# Patient Record
Sex: Female | Born: 1955 | Race: Black or African American | Hispanic: No | Marital: Single
Health system: Southern US, Community
[De-identification: ages and names within clinical notes are randomized; demographics above are authoritative.]

## PROBLEM LIST (undated history)

## (undated) DIAGNOSIS — I639 Cerebral infarction, unspecified: Secondary | ICD-10-CM

## (undated) DIAGNOSIS — I10 Essential (primary) hypertension: Secondary | ICD-10-CM

---

## 2020-08-13 ENCOUNTER — Observation Stay (HOSPITAL_COMMUNITY): Payer: Medicaid Other

## 2020-08-13 ENCOUNTER — Other Ambulatory Visit: Payer: Self-pay

## 2020-08-13 ENCOUNTER — Emergency Department (HOSPITAL_COMMUNITY): Payer: Medicaid Other

## 2020-08-13 ENCOUNTER — Encounter (HOSPITAL_COMMUNITY): Payer: Self-pay

## 2020-08-13 ENCOUNTER — Inpatient Hospital Stay (HOSPITAL_COMMUNITY)
Admission: EM | Admit: 2020-08-13 | Discharge: 2020-08-19 | DRG: 304 | Disposition: A | Discharge status: Skilled Nursing Facility | Payer: Medicaid Other | Attending: Internal Medicine | Admitting: Internal Medicine

## 2020-08-13 DIAGNOSIS — I340 Nonrheumatic mitral (valve) insufficiency: Secondary | ICD-10-CM | POA: Diagnosis present

## 2020-08-13 DIAGNOSIS — N189 Chronic kidney disease, unspecified: Secondary | ICD-10-CM

## 2020-08-13 DIAGNOSIS — G8929 Other chronic pain: Secondary | ICD-10-CM | POA: Diagnosis present

## 2020-08-13 DIAGNOSIS — I2510 Atherosclerotic heart disease of native coronary artery without angina pectoris: Secondary | ICD-10-CM | POA: Diagnosis present

## 2020-08-13 DIAGNOSIS — Z7982 Long term (current) use of aspirin: Secondary | ICD-10-CM

## 2020-08-13 DIAGNOSIS — K219 Gastro-esophageal reflux disease without esophagitis: Secondary | ICD-10-CM | POA: Diagnosis present

## 2020-08-13 DIAGNOSIS — R262 Difficulty in walking, not elsewhere classified: Secondary | ICD-10-CM | POA: Diagnosis present

## 2020-08-13 DIAGNOSIS — J9691 Respiratory failure, unspecified with hypoxia: Secondary | ICD-10-CM

## 2020-08-13 DIAGNOSIS — N19 Unspecified kidney failure: Secondary | ICD-10-CM

## 2020-08-13 DIAGNOSIS — E785 Hyperlipidemia, unspecified: Secondary | ICD-10-CM | POA: Diagnosis present

## 2020-08-13 DIAGNOSIS — R531 Weakness: Secondary | ICD-10-CM | POA: Diagnosis present

## 2020-08-13 DIAGNOSIS — G4700 Insomnia, unspecified: Secondary | ICD-10-CM | POA: Diagnosis present

## 2020-08-13 DIAGNOSIS — I252 Old myocardial infarction: Secondary | ICD-10-CM

## 2020-08-13 DIAGNOSIS — I248 Other forms of acute ischemic heart disease: Secondary | ICD-10-CM | POA: Diagnosis present

## 2020-08-13 DIAGNOSIS — I6381 Other cerebral infarction due to occlusion or stenosis of small artery: Secondary | ICD-10-CM | POA: Diagnosis present

## 2020-08-13 DIAGNOSIS — F419 Anxiety disorder, unspecified: Secondary | ICD-10-CM | POA: Diagnosis present

## 2020-08-13 DIAGNOSIS — F0390 Unspecified dementia without behavioral disturbance: Secondary | ICD-10-CM | POA: Diagnosis present

## 2020-08-13 DIAGNOSIS — Z6831 Body mass index (BMI) 31.0-31.9, adult: Secondary | ICD-10-CM

## 2020-08-13 DIAGNOSIS — Z8679 Personal history of other diseases of the circulatory system: Secondary | ICD-10-CM

## 2020-08-13 DIAGNOSIS — G9341 Metabolic encephalopathy: Secondary | ICD-10-CM | POA: Diagnosis present

## 2020-08-13 DIAGNOSIS — E669 Obesity, unspecified: Secondary | ICD-10-CM | POA: Diagnosis present

## 2020-08-13 DIAGNOSIS — I639 Cerebral infarction, unspecified: Secondary | ICD-10-CM | POA: Diagnosis present

## 2020-08-13 DIAGNOSIS — R29701 NIHSS score 1: Secondary | ICD-10-CM | POA: Diagnosis present

## 2020-08-13 DIAGNOSIS — Z7902 Long term (current) use of antithrombotics/antiplatelets: Secondary | ICD-10-CM

## 2020-08-13 DIAGNOSIS — I6529 Occlusion and stenosis of unspecified carotid artery: Secondary | ICD-10-CM | POA: Diagnosis present

## 2020-08-13 DIAGNOSIS — Z9114 Patient's other noncompliance with medication regimen: Secondary | ICD-10-CM

## 2020-08-13 DIAGNOSIS — I5033 Acute on chronic diastolic (congestive) heart failure: Secondary | ICD-10-CM | POA: Diagnosis present

## 2020-08-13 DIAGNOSIS — R630 Anorexia: Secondary | ICD-10-CM | POA: Diagnosis present

## 2020-08-13 DIAGNOSIS — I10 Essential (primary) hypertension: Secondary | ICD-10-CM | POA: Diagnosis present

## 2020-08-13 DIAGNOSIS — Z23 Encounter for immunization: Secondary | ICD-10-CM

## 2020-08-13 DIAGNOSIS — F32A Depression, unspecified: Secondary | ICD-10-CM

## 2020-08-13 DIAGNOSIS — R5381 Other malaise: Secondary | ICD-10-CM | POA: Diagnosis present

## 2020-08-13 DIAGNOSIS — R109 Unspecified abdominal pain: Secondary | ICD-10-CM | POA: Diagnosis present

## 2020-08-13 DIAGNOSIS — R0600 Dyspnea, unspecified: Secondary | ICD-10-CM

## 2020-08-13 DIAGNOSIS — Z20822 Contact with and (suspected) exposure to covid-19: Secondary | ICD-10-CM | POA: Diagnosis present

## 2020-08-13 DIAGNOSIS — D696 Thrombocytopenia, unspecified: Secondary | ICD-10-CM | POA: Diagnosis present

## 2020-08-13 DIAGNOSIS — F209 Schizophrenia, unspecified: Secondary | ICD-10-CM | POA: Diagnosis present

## 2020-08-13 DIAGNOSIS — I130 Hypertensive heart and chronic kidney disease with heart failure and stage 1 through stage 4 chronic kidney disease, or unspecified chronic kidney disease: Secondary | ICD-10-CM | POA: Diagnosis present

## 2020-08-13 DIAGNOSIS — Z79899 Other long term (current) drug therapy: Secondary | ICD-10-CM

## 2020-08-13 DIAGNOSIS — I160 Hypertensive urgency: Secondary | ICD-10-CM | POA: Diagnosis present

## 2020-08-13 DIAGNOSIS — N184 Chronic kidney disease, stage 4 (severe): Secondary | ICD-10-CM | POA: Diagnosis present

## 2020-08-13 DIAGNOSIS — Z951 Presence of aortocoronary bypass graft: Secondary | ICD-10-CM

## 2020-08-13 DIAGNOSIS — N179 Acute kidney failure, unspecified: Secondary | ICD-10-CM | POA: Diagnosis present

## 2020-08-13 DIAGNOSIS — R413 Other amnesia: Secondary | ICD-10-CM

## 2020-08-13 DIAGNOSIS — K5900 Constipation, unspecified: Secondary | ICD-10-CM | POA: Diagnosis not present

## 2020-08-13 DIAGNOSIS — I161 Hypertensive emergency: Secondary | ICD-10-CM | POA: Diagnosis present

## 2020-08-13 DIAGNOSIS — D631 Anemia in chronic kidney disease: Secondary | ICD-10-CM | POA: Diagnosis present

## 2020-08-13 LAB — CBC WITH DIFFERENTIAL/PLATELET
Abs Immature Granulocytes: 0.03 10*3/uL (ref 0.00–0.07)
Basophils Absolute: 0 10*3/uL (ref 0.0–0.1)
Basophils Relative: 0 %
Eosinophils Absolute: 0.3 10*3/uL (ref 0.0–0.5)
Eosinophils Relative: 4 %
HCT: 32.8 % — ABNORMAL LOW (ref 36.0–46.0)
Hemoglobin: 10.5 g/dL — ABNORMAL LOW (ref 12.0–15.0)
Immature Granulocytes: 0 %
Lymphocytes Relative: 35 %
Lymphs Abs: 2.8 10*3/uL (ref 0.7–4.0)
MCH: 28.7 pg (ref 26.0–34.0)
MCHC: 32 g/dL (ref 30.0–36.0)
MCV: 89.6 fL (ref 80.0–100.0)
Monocytes Absolute: 0.5 10*3/uL (ref 0.1–1.0)
Monocytes Relative: 7 %
Neutro Abs: 4.3 10*3/uL (ref 1.7–7.7)
Neutrophils Relative %: 54 %
Platelets: 167 10*3/uL (ref 150–400)
RBC: 3.66 MIL/uL — ABNORMAL LOW (ref 3.87–5.11)
RDW: 14.8 % (ref 11.5–15.5)
WBC: 8 10*3/uL (ref 4.0–10.5)
nRBC: 0 % (ref 0.0–0.2)

## 2020-08-13 LAB — COMPREHENSIVE METABOLIC PANEL
ALT: 15 U/L (ref 0–44)
AST: 14 U/L — ABNORMAL LOW (ref 15–41)
Albumin: 3.8 g/dL (ref 3.5–5.0)
Alkaline Phosphatase: 68 U/L (ref 38–126)
Anion gap: 8 (ref 5–15)
BUN: 65 mg/dL — ABNORMAL HIGH (ref 8–23)
CO2: 22 mmol/L (ref 22–32)
Calcium: 9.2 mg/dL (ref 8.9–10.3)
Chloride: 109 mmol/L (ref 98–111)
Creatinine, Ser: 3.72 mg/dL — ABNORMAL HIGH (ref 0.44–1.00)
GFR, Estimated: 13 mL/min — ABNORMAL LOW (ref >60)
Glucose, Bld: 118 mg/dL — ABNORMAL HIGH (ref 70–99)
Potassium: 4.4 mmol/L (ref 3.5–5.1)
Sodium: 139 mmol/L (ref 135–145)
Total Bilirubin: 0.4 mg/dL (ref 0.3–1.2)
Total Protein: 7.4 g/dL (ref 6.5–8.1)

## 2020-08-13 LAB — URINALYSIS, ROUTINE W REFLEX MICROSCOPIC
Bilirubin Urine: NEGATIVE
Glucose, UA: NEGATIVE mg/dL
Hgb urine dipstick: NEGATIVE
Ketones, ur: NEGATIVE mg/dL
Leukocytes,Ua: NEGATIVE
Nitrite: NEGATIVE
Protein, ur: >=300 mg/dL — AB
Specific Gravity, Urine: 1.013 (ref 1.005–1.030)
pH: 5 (ref 5.0–8.0)

## 2020-08-13 LAB — TROPONIN I (HIGH SENSITIVITY): Troponin I (High Sensitivity): 23 ng/L — ABNORMAL HIGH (ref <18)

## 2020-08-13 LAB — HEMOGLOBIN A1C
Hgb A1c MFr Bld: 6.3 % — ABNORMAL HIGH (ref 4.8–5.6)
Mean Plasma Glucose: 134.11 mg/dL

## 2020-08-13 LAB — BRAIN NATRIURETIC PEPTIDE: B Natriuretic Peptide: 973.1 pg/mL — ABNORMAL HIGH (ref 0.0–100.0)

## 2020-08-13 IMAGING — CT CT RENAL STONE PROTOCOL
2 of 4 series · 16 of 46 positions shown, 18 images · non-contrast
Comparison: None.

CLINICAL DATA: Flank pain, urinary retention

EXAM:
CT ABDOMEN AND PELVIS WITHOUT CONTRAST
TECHNIQUE: Multidetector CT imaging of the abdomen and pelvis was performed
following the standard protocol without IV contrast.

[Series 2: axial st · axial · 0.87mm/px · z∈[-554,-164]mm · 13 of 88 slices shown, 15 images]
[im 5/88  soft-tissue]
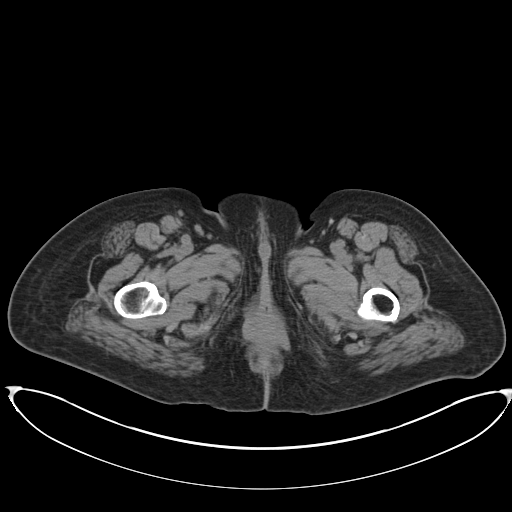
[im 5/88  bone]
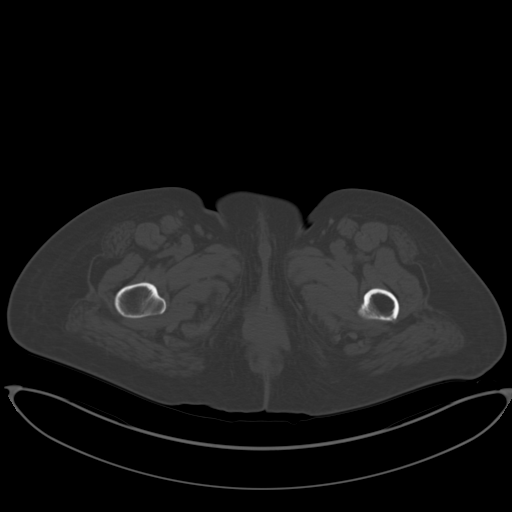
[im 10/88  soft-tissue]
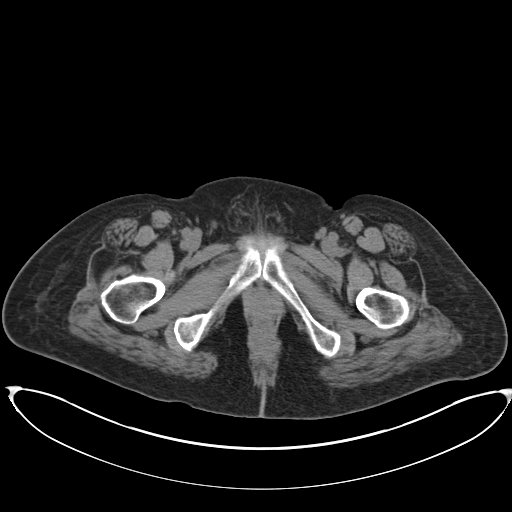
[im 20/88  soft-tissue]
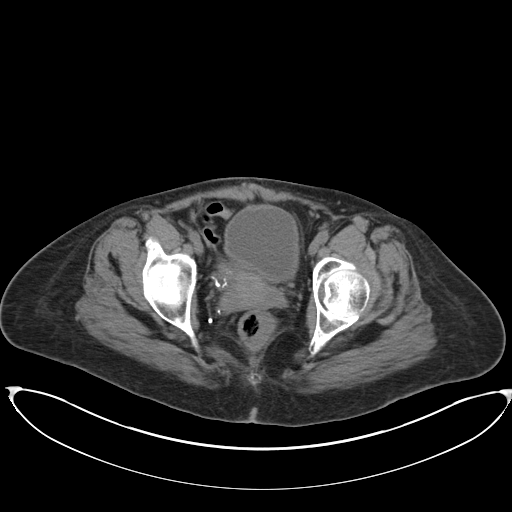
[im 25/88  soft-tissue]
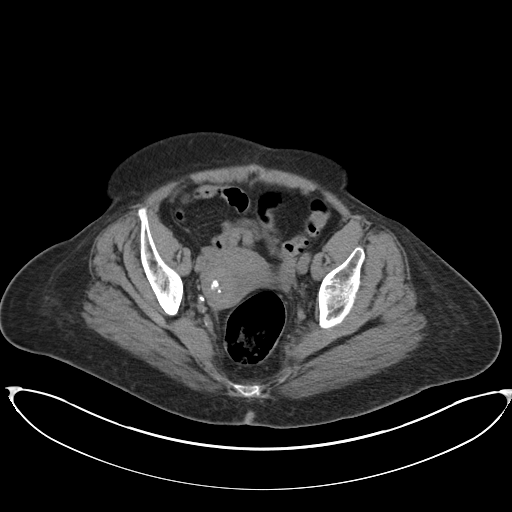
[im 30/88  soft-tissue]
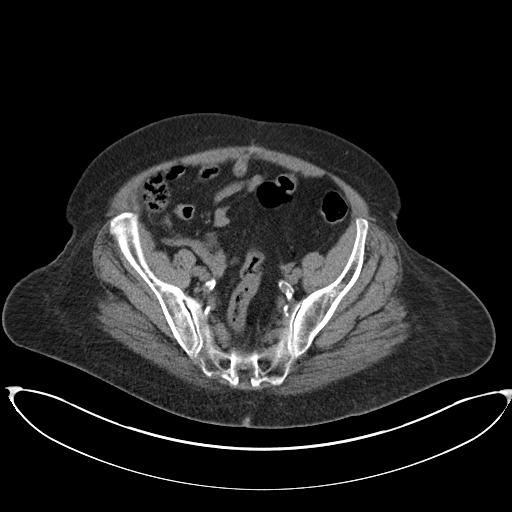
[im 39/88  soft-tissue]
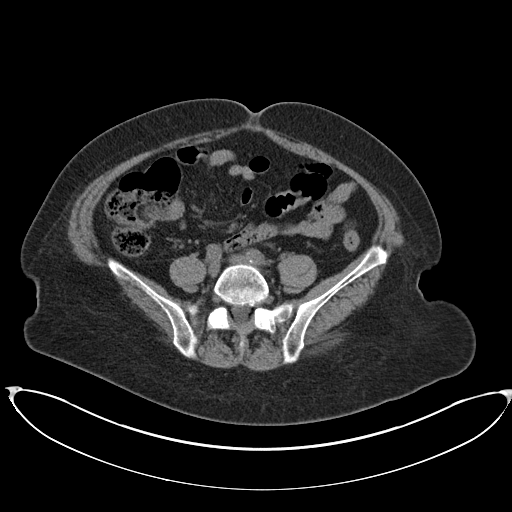
[im 44/88  soft-tissue]
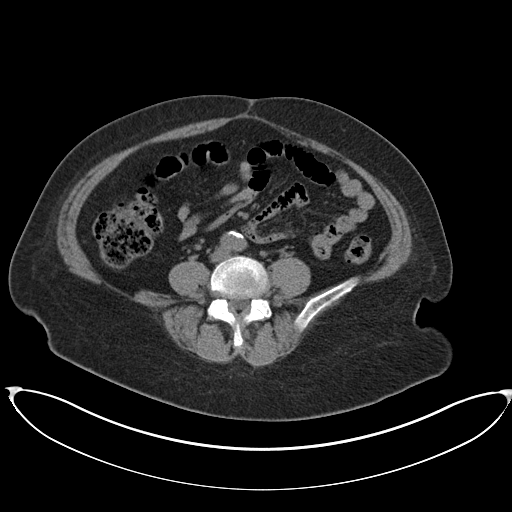
[im 49/88  soft-tissue]
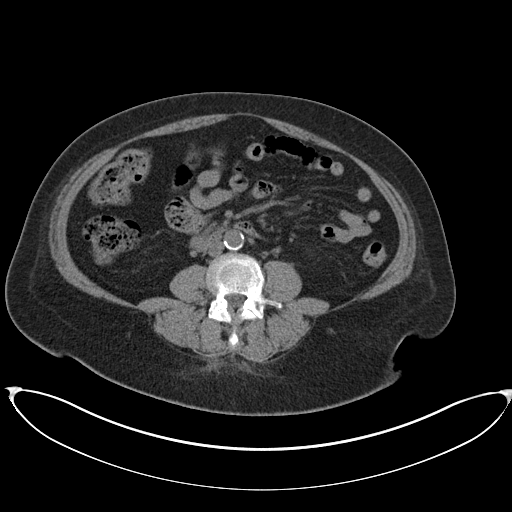
[im 59/88  soft-tissue]
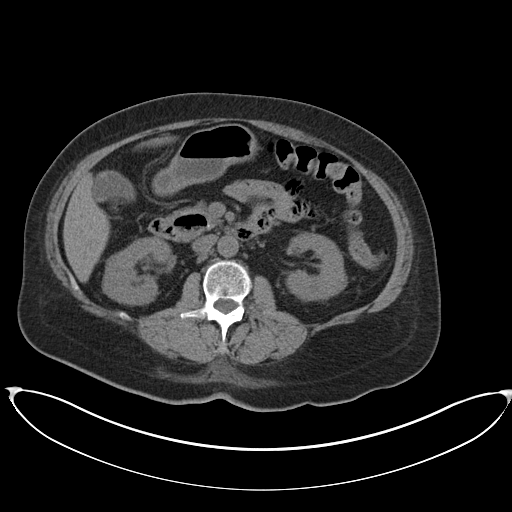
[im 59/88  bone]
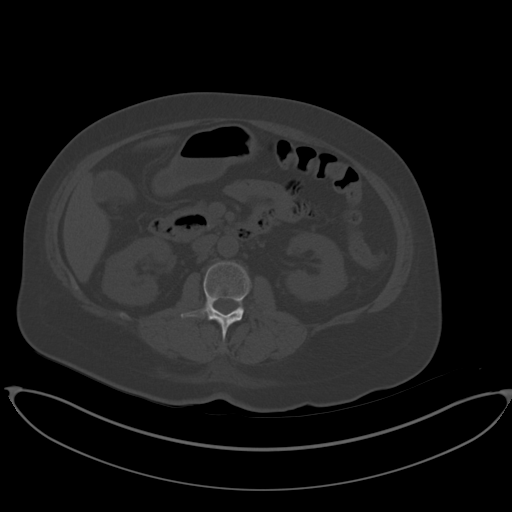
[im 63/88  soft-tissue]
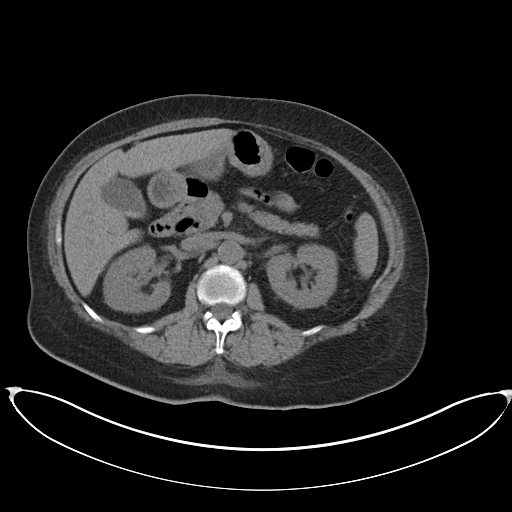
[im 68/88  soft-tissue]
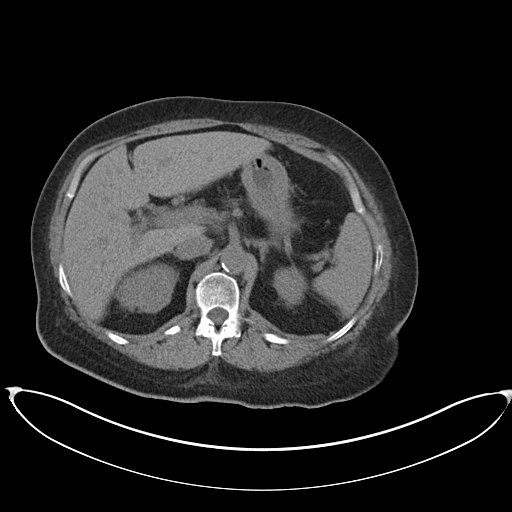
[im 78/88  soft-tissue]
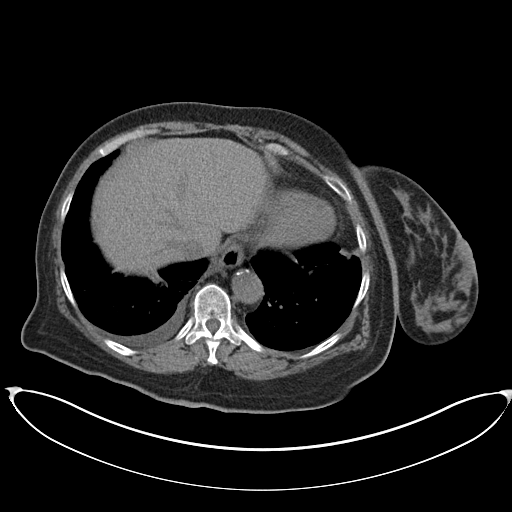
[im 83/88  soft-tissue]
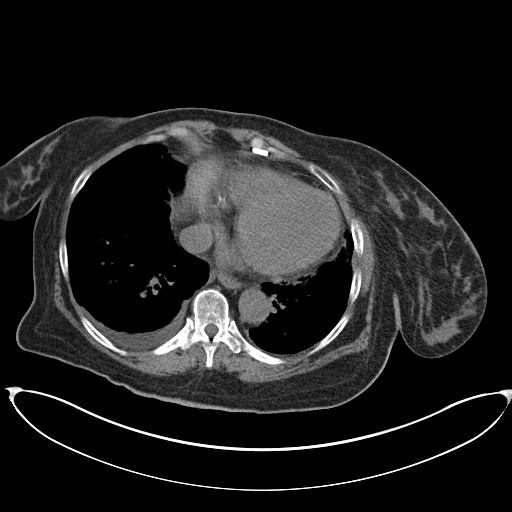

[Series 5: coronal · coronal · 0.75mm/px · 3 of 147 slices shown]
[im 49/147  soft-tissue]
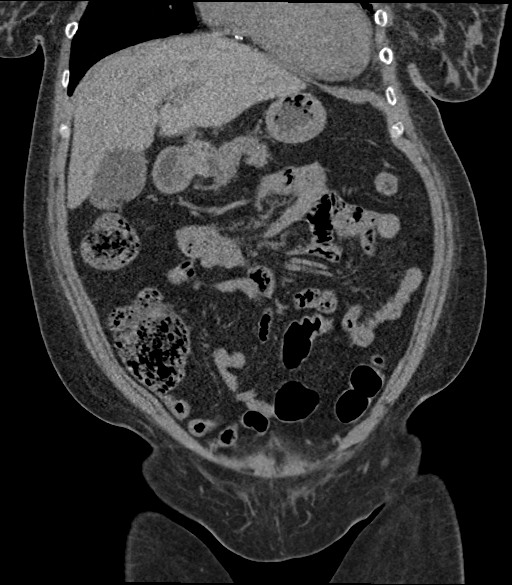
[im 65/147  soft-tissue]
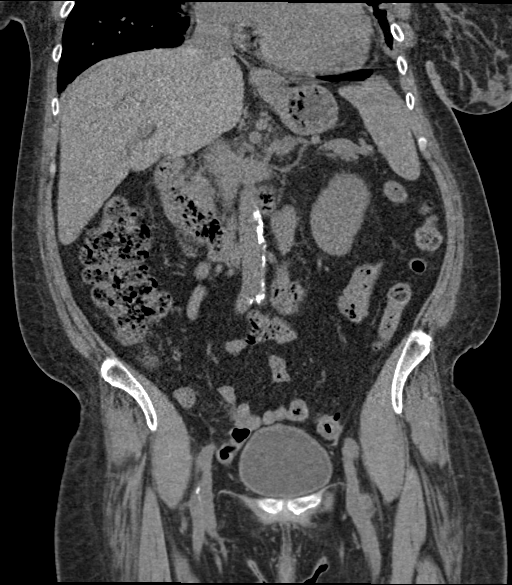
[im 82/147  soft-tissue]
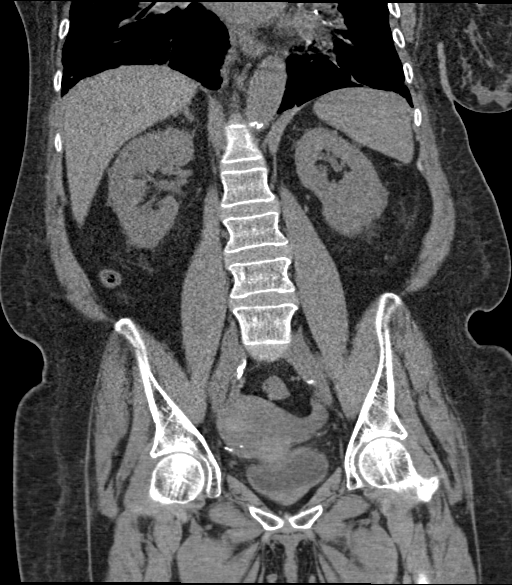

[16 of 46 positions shown; findings below may reference images not displayed]

FINDINGS: Lower chest: There is a trace right pleural effusion. Patchy areas
of consolidation at the lung bases likely reflect atelectasis. No
pericardial effusion.

Hepatobiliary: Small gallstones are seen layering dependently in the
gallbladder. No evidence of acute cholecystitis. Unenhanced imaging
of the liver is unremarkable. No biliary dilation.

Pancreas: Unremarkable. No pancreatic ductal dilatation or
surrounding inflammatory changes.

Spleen: Normal in size without focal abnormality.

Adrenals/Urinary Tract: No urinary tract calculi or obstructive
uropathy. Bladder is unremarkable. The adrenals are normal.

Stomach/Bowel: No bowel obstruction or ileus. Normal appendix right
lower quadrant. No bowel wall thickening or inflammatory change.

Vascular/Lymphatic: Aortic atherosclerosis. No enlarged abdominal or
pelvic lymph nodes.

Reproductive: Degenerating uterine fibroid right fundal aspect
measuring up to 3.6 cm. Otherwise the uterus and adnexal structures
are unremarkable.

Other: No free fluid or free gas. No abdominal wall hernia.

Musculoskeletal: No acute or destructive bony lesions. Reconstructed
images demonstrate no additional findings.
IMPRESSION: 1. No urinary tract calculi or obstructive uropathy.
2. Trace right pleural effusion.
3. Cholelithiasis without evidence of acute cholecystitis.
4. Degenerating uterine fibroid.
5. Aortic Atherosclerosis ([FW]-[FW]).

## 2020-08-13 IMAGING — DX DG CHEST 1V PORT
1 series · 1 of 1 positions shown · non-contrast
Comparison: None.

CLINICAL DATA: Abdomen pain hypertension

EXAM:
PORTABLE CHEST 1 VIEW

[chest ap]
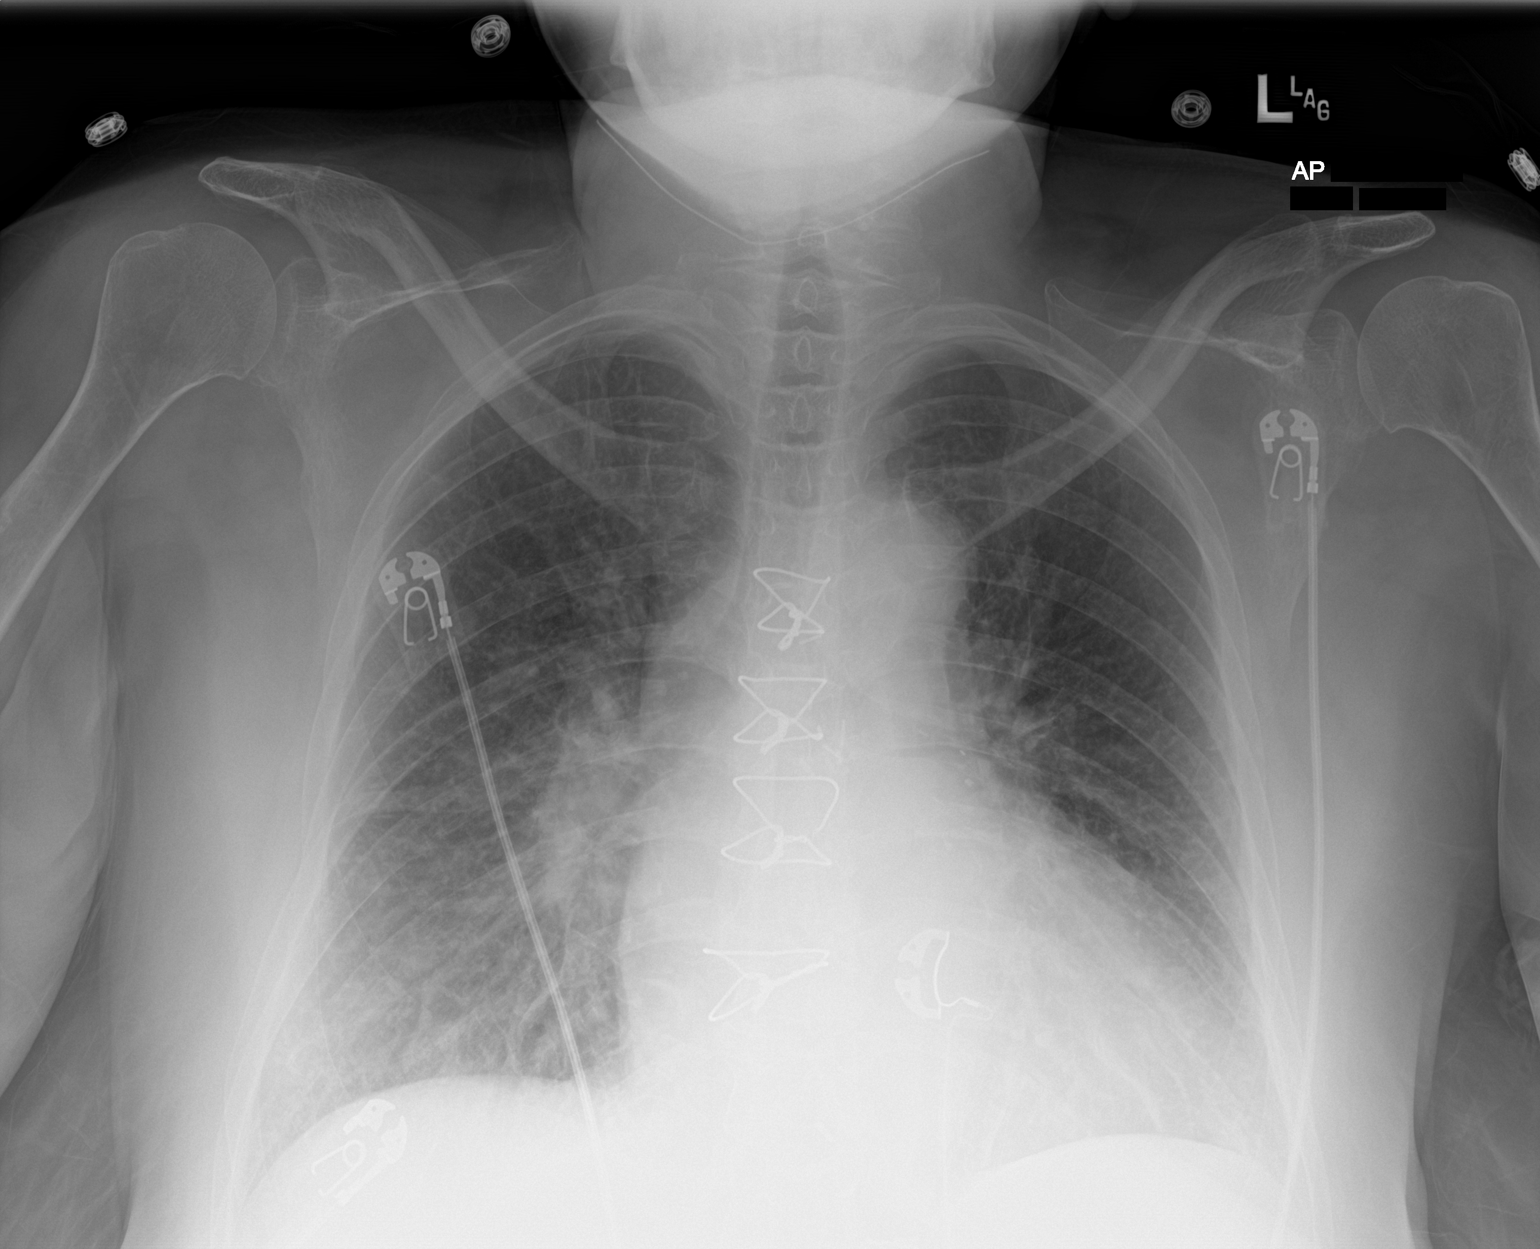

[1 of 1 positions shown; findings below may reference images not displayed]

FINDINGS: Post sternotomy changes. Cardiomegaly with vascular congestion and
mild diffuse interstitial opacity, likely edema. No pneumothorax.
IMPRESSION: Cardiomegaly with vascular congestion and probable mild interstitial
edema.

## 2020-08-13 IMAGING — CT CT HEAD W/O CM
3 series · 16 of 47 positions shown, 19 images · non-contrast
Comparison: None.

CLINICAL DATA: Encephalopathy

EXAM:
CT HEAD WITHOUT CONTRAST
TECHNIQUE: Contiguous axial images were obtained from the base of the skull
through the vertex without intravenous contrast.

[Series 2: head wo · axial · 0.42mm/px · z∈[-155,-30]mm · 10 of 30 slices shown, 13 images]
[im 3/30  brain]
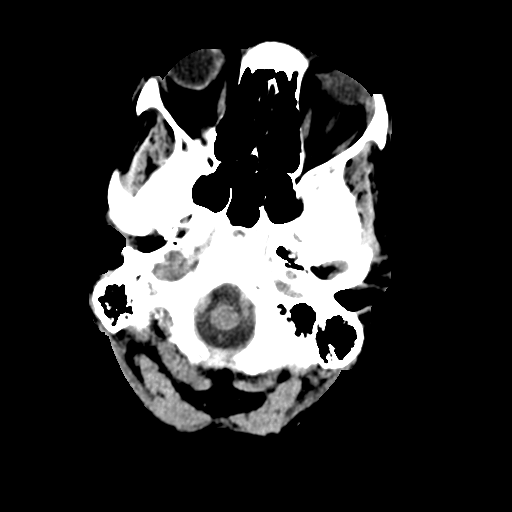
[im 3/30  bone]
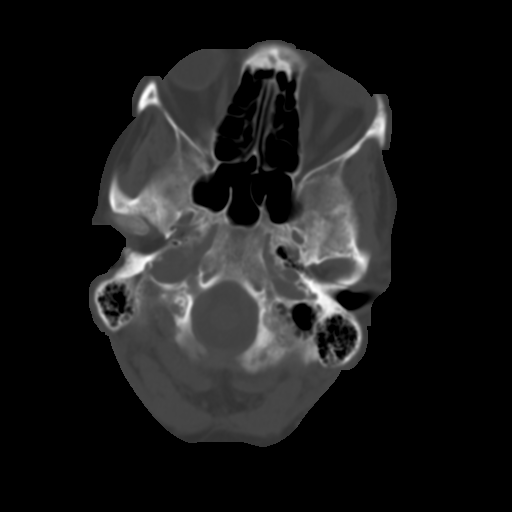
[im 6/30  brain]
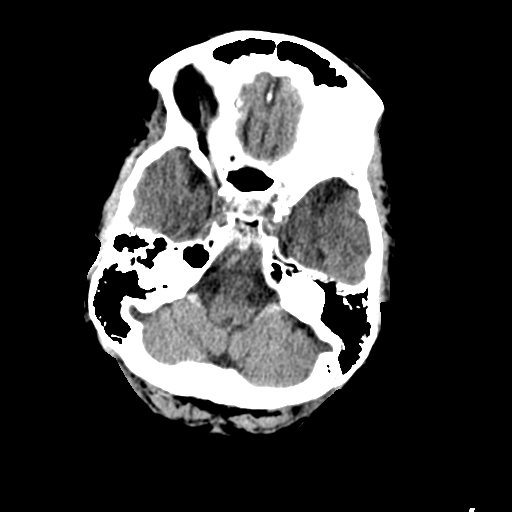
[im 9/30  brain]
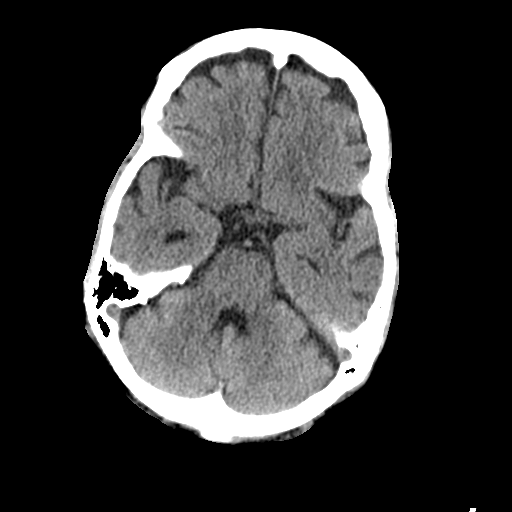
[im 11/30  brain]
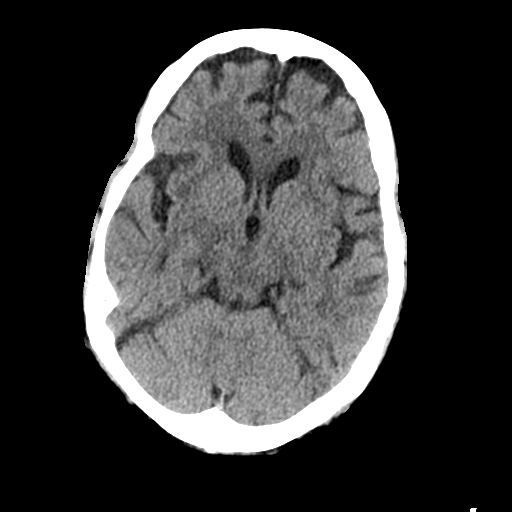
[im 14/30  brain]
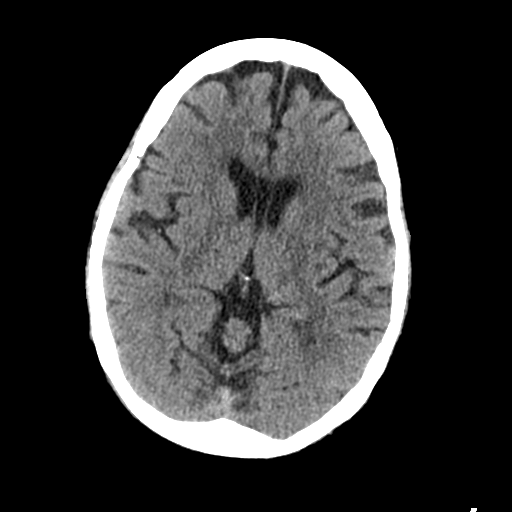
[im 14/30  bone]
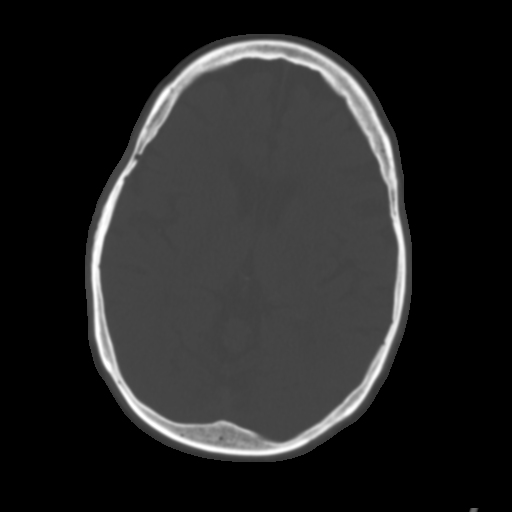
[im 17/30  brain]
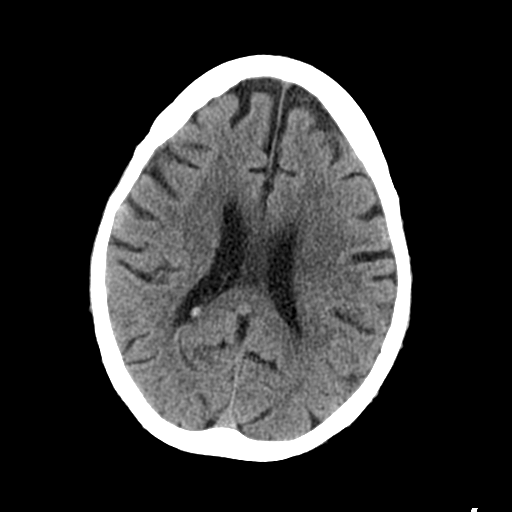
[im 20/30  brain]
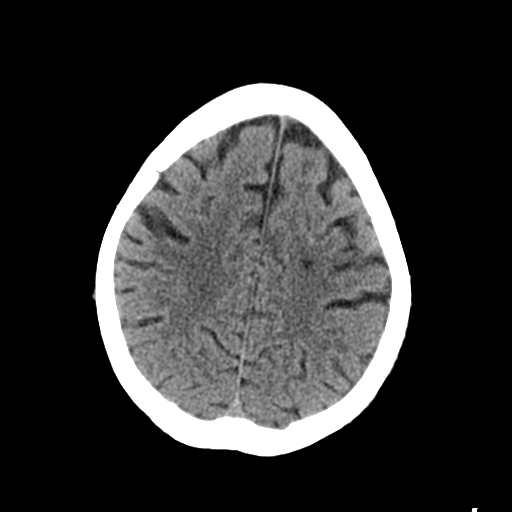
[im 23/30  brain]
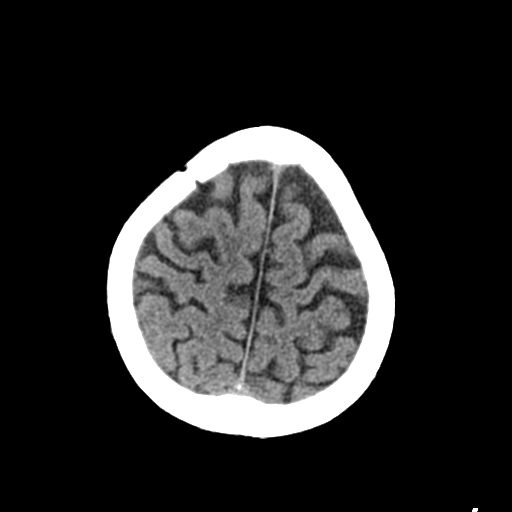
[im 25/30  brain]
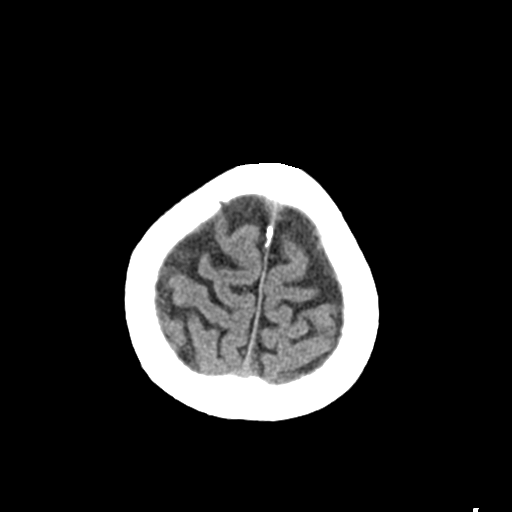
[im 25/30  bone]
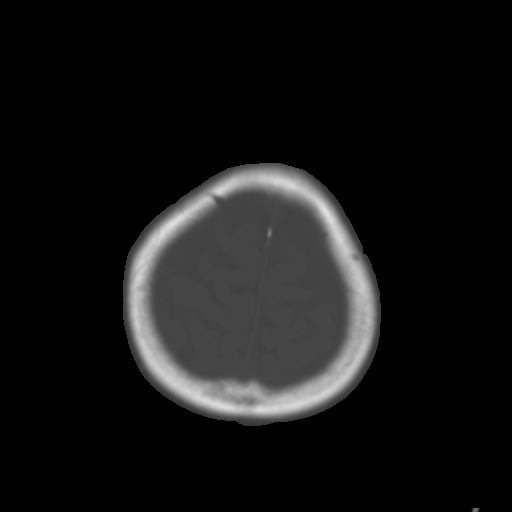
[im 28/30  brain]
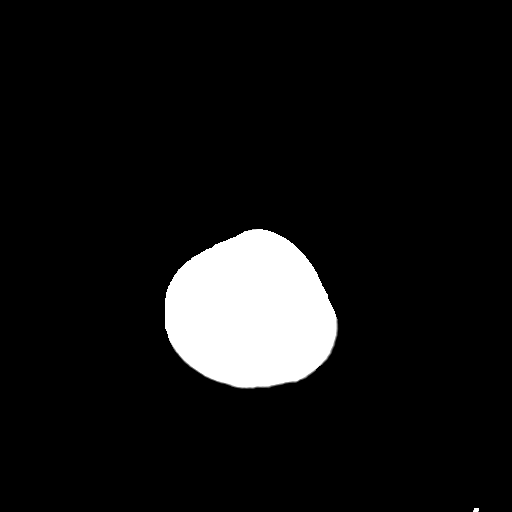

[Series 5: coronal soft tissue · coronal · 0.29mm/px · 3 of 63 slices shown]
[im 21/63  brain]
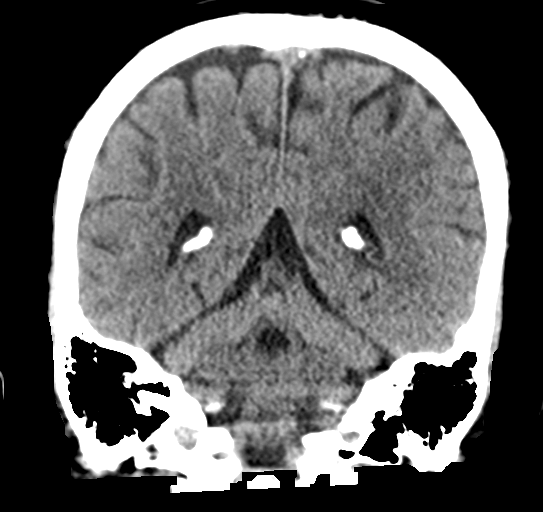
[im 28/63  brain]
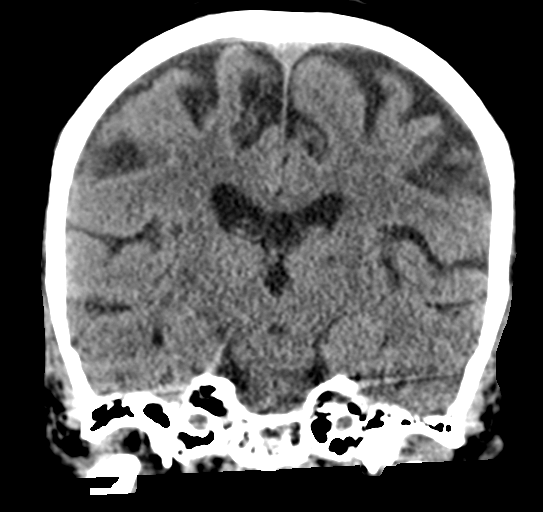
[im 35/63  brain]
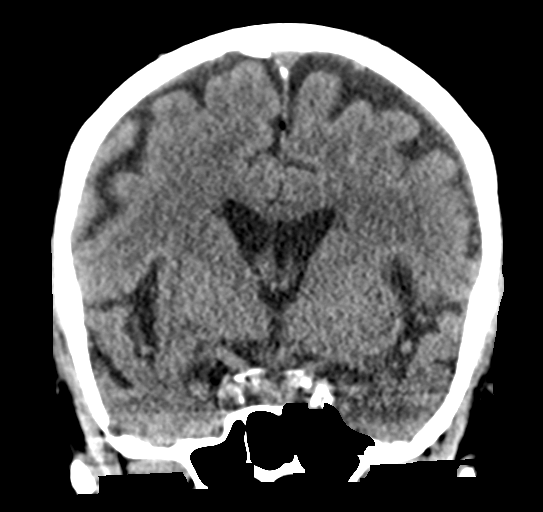

[Series 6: sagittal soft tissue · sagittal · 0.29mm/px · 3 of 49 slices shown]
[im 17/49  brain]
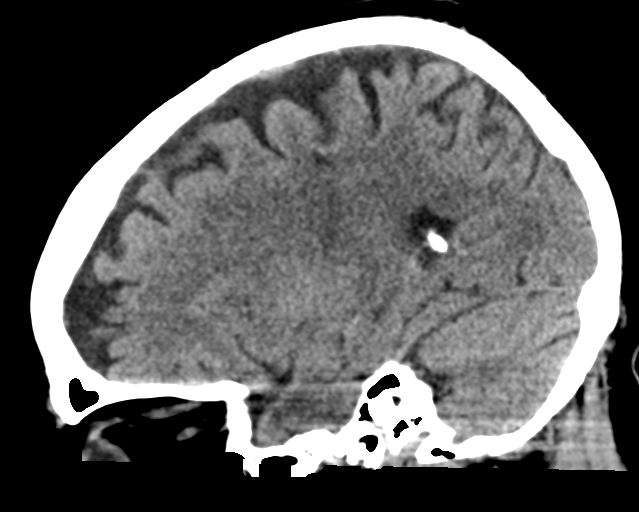
[im 25/49  brain]
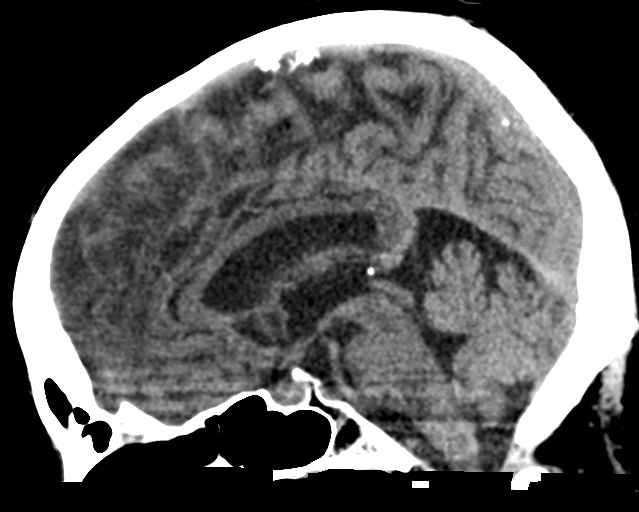
[im 33/49  brain]
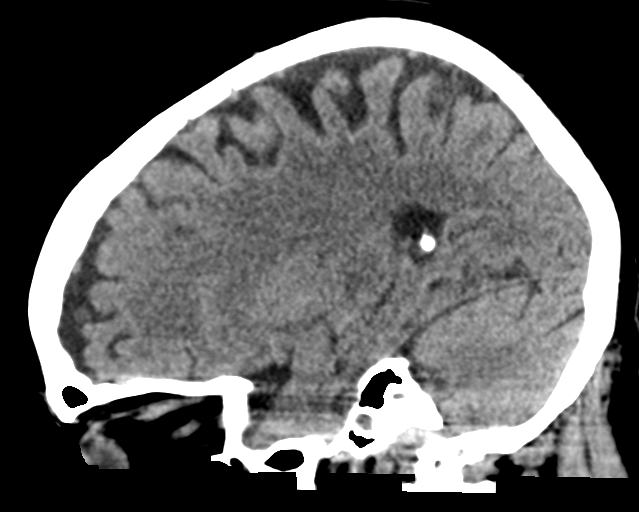

[16 of 47 positions shown; findings below may reference images not displayed]

FINDINGS: Brain: There is no mass, hemorrhage or extra-axial collection. The
size and configuration of the ventricles and extra-axial CSF spaces
are normal. There is hypoattenuation of the white matter, most
commonly indicating chronic small vessel disease.

Vascular: No abnormal hyperdensity of the major intracranial
arteries or dural venous sinuses. No intracranial atherosclerosis.

Skull: The visualized skull base, calvarium and extracranial soft
tissues are normal.

Sinuses/Orbits: No fluid levels or advanced mucosal thickening of
the visualized paranasal sinuses. No mastoid or middle ear effusion.
The orbits are normal.
IMPRESSION: Chronic small vessel disease without acute intracranial abnormality.

## 2020-08-13 MED ORDER — ACETAMINOPHEN 325 MG PO TABS
325.0000 mg | ORAL_TABLET | Freq: Four times a day (QID) | ORAL | Status: DC | PRN
  Administered 2020-08-14 – 2020-08-26 (×7): 325 mg via ORAL
  Filled 2020-08-13 (×9): qty 1

## 2020-08-13 MED ORDER — PANTOPRAZOLE SODIUM 40 MG PO TBEC
40.0000 mg | DELAYED_RELEASE_TABLET | Freq: Every day | ORAL | Status: DC
  Administered 2020-08-13 – 2020-08-24 (×12): 40 mg via ORAL
  Filled 2020-08-13 (×15): qty 1

## 2020-08-13 MED ORDER — MORPHINE SULFATE (PF) 4 MG/ML IV SOLN
4.0000 mg | Freq: Once | INTRAVENOUS | Status: AC
  Administered 2020-08-13: 16:00:00 4 mg via INTRAVENOUS
  Filled 2020-08-13: qty 1

## 2020-08-13 MED ORDER — ATORVASTATIN CALCIUM 40 MG PO TABS
40.0000 mg | ORAL_TABLET | Freq: Every day | ORAL | Status: DC
  Administered 2020-08-13 – 2020-08-26 (×14): 40 mg via ORAL
  Filled 2020-08-13 (×14): qty 1

## 2020-08-13 MED ORDER — CARVEDILOL 12.5 MG PO TABS
12.5000 mg | ORAL_TABLET | Freq: Two times a day (BID) | ORAL | Status: DC
  Administered 2020-08-13 – 2020-08-15 (×4): 12.5 mg via ORAL
  Filled 2020-08-13 (×4): qty 1

## 2020-08-13 MED ORDER — FLUOXETINE HCL 20 MG PO CAPS
80.0000 mg | ORAL_CAPSULE | Freq: Every day | ORAL | Status: DC
  Administered 2020-08-13 – 2020-08-26 (×14): 80 mg via ORAL
  Filled 2020-08-13 (×14): qty 4

## 2020-08-13 MED ORDER — ONDANSETRON HCL 4 MG/2ML IJ SOLN: 4.0000 mg | Freq: Four times a day (QID) | INTRAMUSCULAR | Status: DC | PRN

## 2020-08-13 MED ORDER — MELATONIN 3 MG PO TABS
3.0000 mg | ORAL_TABLET | Freq: Every day | ORAL | Status: DC
  Administered 2020-08-13 – 2020-08-25 (×13): 3 mg via ORAL
  Filled 2020-08-13 (×13): qty 1

## 2020-08-13 MED ORDER — ACETAMINOPHEN 650 MG RE SUPP: 325.0000 mg | Freq: Four times a day (QID) | RECTAL | Status: DC | PRN

## 2020-08-13 MED ORDER — AMLODIPINE BESYLATE 10 MG PO TABS
10.0000 mg | ORAL_TABLET | Freq: Every day | ORAL | Status: DC
  Administered 2020-08-13 – 2020-08-15 (×3): 10 mg via ORAL
  Filled 2020-08-13: qty 2
  Filled 2020-08-13 (×2): qty 1

## 2020-08-13 MED ORDER — INFLUENZA VAC SPLIT QUAD 0.5 ML IM SUSY
0.5000 mL | PREFILLED_SYRINGE | INTRAMUSCULAR | Status: AC
  Administered 2020-08-15: 10:00:00 0.5 mL via INTRAMUSCULAR
  Filled 2020-08-13: qty 0.5

## 2020-08-13 MED ORDER — LABETALOL HCL 5 MG/ML IV SOLN
10.0000 mg | Freq: Once | INTRAVENOUS | Status: AC
  Administered 2020-08-13: 20:00:00 10 mg via INTRAVENOUS
  Filled 2020-08-13: qty 4

## 2020-08-13 MED ORDER — ONDANSETRON HCL 4 MG PO TABS: 4.0000 mg | ORAL_TABLET | Freq: Four times a day (QID) | ORAL | Status: DC | PRN

## 2020-08-13 MED ORDER — ONDANSETRON HCL 4 MG/2ML IJ SOLN
4.0000 mg | Freq: Once | INTRAMUSCULAR | Status: AC
  Administered 2020-08-13: 16:00:00 4 mg via INTRAVENOUS
  Filled 2020-08-13: qty 2

## 2020-08-13 NOTE — ED Provider Notes (Signed)
[facility] DEPT Provider Note   CSN: [id number] Arrival date & time: 08/13/20  1453     History Chief Complaint  Patient presents with  . Urinary Retention  . Flank Pain    Christina Nunez is a 64 y.o. female.  Christina Nunez is a 64 y.o. female with a history of high blood pressure and chronic kidney disease, other medical history is unclear, who presents to the ED complaining of flank and right-sided abdominal pain with urinary retention.  Patient is accompanied by her sister who is now her caretaker.  Previously patient was living with her son and received all of her medical care in [place].  The patient sister reports she needs to get "a full medical evaluation and a psychiatric evaluation" they are trying to get her a primary care doctor after recently getting her Medicare.  She has been living with his sister since October, sister brought in medications but is not sure if these are the only medication she supposed to be taking, and states she has been running out of them.  Patient states that she is unsure the last time she was able to urinate, sister thinks it has been 6-8 hours at least but is also unsure.  Patient denies burning or pain with urination or blood in her urine.  Reports she has been having flank and back pain and right-sided abdominal pain intermittently since yesterday.  Reports she is nauseated but has not had any vomiting.  No fevers or chills.  Denies chest pain, shortness of breath or cough.  Reports normal bowel movements, nonbloody.  Patient is unable to provide much additional history, 2 other question she answers that she does not know or is not sure.  Patient's sister who is taken over her care is not sure of all her prior medical history, and states that is why she needs a full medical evaluation and think she needs a psychiatric evaluation as well, they have been trying to get her into [facility] but she has not yet been seen.  Patient denies  any HI or SI.        History reviewed. No pertinent past medical history.  Patient Active Problem List   Diagnosis Date Noted  . Essential hypertension 08/13/2020  . Hypertensive emergency 08/13/2020    History reviewed. No pertinent surgical history.   OB History   No obstetric history on file.     History reviewed. No pertinent family history.     Home Medications Prior to Admission medications   Medication Sig Start Date End Date Taking? Authorizing Provider  amLODipine (NORVASC) 10 MG tablet Take 10 mg by mouth daily.   Yes [provider]  aspirin EC 81 MG tablet Take 81 mg by mouth daily. Swallow whole.   Yes [provider]  clopidogrel (PLAVIX) 75 MG tablet Take 75 mg by mouth daily.   Yes [provider]  FLUoxetine (PROZAC) 40 MG capsule Take 80 mg by mouth daily.   Yes [provider]  furosemide (LASIX) 80 MG tablet Take 80 mg by mouth 2 (two) times daily.   Yes [provider]  Vitamin D, Ergocalciferol, (DRISDOL) 1.25 MG (50000 UNIT) CAPS capsule Take 50,000 Units by mouth every 7 (seven) days.   Yes [provider]    Allergies    Patient has no known allergies.  Review of Systems   Review of Systems  Constitutional: Negative for chills and fever.  HENT: Negative.   Respiratory: Negative  for cough and shortness of breath.   Cardiovascular: Negative for chest pain and leg swelling.  Gastrointestinal: Positive for abdominal pain and nausea. Negative for blood in stool, constipation, diarrhea and vomiting.  Genitourinary: Positive for difficulty urinating and flank pain. Negative for dysuria, frequency, hematuria, vaginal bleeding and vaginal discharge.  Musculoskeletal: Positive for back pain. Negative for arthralgias and myalgias.  Skin: Negative for color change and rash.  Neurological: Negative for dizziness, syncope and light-headedness.    Physical Exam Updated Vital Signs BP (!) 218/109 (BP  Location: Right Arm)   Pulse 82   Temp 98.1 F (36.7 C) (Oral)   Resp 16   Ht 5\' 3"  (1.6 m)   Wt 81.6 kg   SpO2 93%   BMI 31.89 kg/m   Physical Exam Vitals and nursing note reviewed.  Constitutional:      General: She is not in acute distress.    Appearance: She is well-developed and well-nourished. She is not diaphoretic.     Comments: Patient is alert, able to answer most questions and follow commands, chronically ill-appearing but in no acute distress  HENT:     Head: Normocephalic and atraumatic.     Mouth/Throat:     Mouth: Oropharynx is clear and moist.  Eyes:     General:        Right eye: No discharge.        Left eye: No discharge.     Extraocular Movements: EOM normal.     Pupils: Pupils are equal, round, and reactive to light.  Cardiovascular:     Rate and Rhythm: Normal rate and regular rhythm.     Pulses: Intact distal pulses.     Heart sounds: Normal heart sounds. No murmur heard. No friction rub. No gallop.   Pulmonary:     Effort: Pulmonary effort is normal. No respiratory distress.     Breath sounds: Normal breath sounds. No wheezing or rales.     Comments: Respirations equal and unlabored, patient able to speak in full sentences, lungs clear to auscultation bilaterally Abdominal:     General: Bowel sounds are normal. There is no distension.     Palpations: Abdomen is soft. There is no mass.     Tenderness: There is abdominal tenderness. There is no guarding.     Comments: Abdomen is soft, nondistended, prior surgical scars noted, bowel sounds noted throughout, there is some mild pain over the right side of the abdomen noted without guarding, patient also reports some CVA tenderness bilaterally.  No peritoneal signs.  Musculoskeletal:        General: No deformity or edema.     Cervical back: Neck supple.  Skin:    General: Skin is warm and dry.     Capillary Refill: Capillary refill takes less than 2 seconds.  Neurological:     Mental Status: She is  alert.     Coordination: Coordination normal.     Comments: Speech is clear, able to follow commands CN III-XII intact Normal strength in upper and lower extremities bilaterally including dorsiflexion and plantar flexion, strong and equal grip strength Sensation normal to light and sharp touch Moves extremities without ataxia, coordination intact  Psychiatric:        Mood and Affect: Mood normal.        Behavior: Behavior normal.     ED Results / Procedures / Treatments   Labs (all labs ordered are listed, but only abnormal results are displayed) Labs Reviewed  URINALYSIS, ROUTINE  W REFLEX MICROSCOPIC - Abnormal; Notable for the following components:      Result Value   Color, Urine STRAW (*)    Protein, ur >=300 (*)    Bacteria, UA RARE (*)    All other components within normal limits  COMPREHENSIVE METABOLIC PANEL - Abnormal; Notable for the following components:   Glucose, Bld 118 (*)    BUN 65 (*)    Creatinine, Ser 3.72 (*)    AST 14 (*)    GFR, Estimated 13 (*)    All other components within normal limits  CBC WITH DIFFERENTIAL/PLATELET - Abnormal; Notable for the following components:   RBC 3.66 (*)    Hemoglobin 10.5 (*)    HCT 32.8 (*)    All other components within normal limits  HEMOGLOBIN A1C - Abnormal; Notable for the following components:   Hgb A1c MFr Bld 6.3 (*)    All other components within normal limits  BRAIN NATRIURETIC PEPTIDE - Abnormal; Notable for the following components:   B Natriuretic Peptide 973.1 (*)    All other components within normal limits  TROPONIN I (HIGH SENSITIVITY) - Abnormal; Notable for the following components:   Troponin I (High Sensitivity) 23 (*)    All other components within normal limits  SARS CORONAVIRUS 2 (TAT 6-24 HRS)  HIV ANTIBODY (ROUTINE TESTING W REFLEX)  TSH  CBC  BASIC METABOLIC PANEL  VITAMIN D 25 HYDROXY (VIT D DEFICIENCY, FRACTURES)  VITAMIN B12  TROPONIN I (HIGH SENSITIVITY)     EKG None  Radiology CT Head Wo Contrast  Result Date: 08/13/2020 CLINICAL DATA:  Encephalopathy EXAM: CT HEAD WITHOUT CONTRAST TECHNIQUE: Contiguous axial images were obtained from the base of the skull through the vertex without intravenous contrast. COMPARISON:  None. FINDINGS: Brain: There is no mass, hemorrhage or extra-axial collection. The size and configuration of the ventricles and extra-axial CSF spaces are normal. There is hypoattenuation of the white matter, most commonly indicating chronic small vessel disease. Vascular: No abnormal hyperdensity of the major intracranial arteries or dural venous sinuses. No intracranial atherosclerosis. Skull: The visualized skull base, calvarium and extracranial soft tissues are normal. Sinuses/Orbits: No fluid levels or advanced mucosal thickening of the visualized paranasal sinuses. No mastoid or middle ear effusion. The orbits are normal. IMPRESSION: Chronic small vessel disease without acute intracranial abnormality. Electronically Signed   By: Ulyses Jarred M.D.   On: 08/13/2020 23:24   DG Chest Portable 1 View  Result Date: 08/13/2020 CLINICAL DATA:  Abdomen pain hypertension EXAM: PORTABLE CHEST 1 VIEW COMPARISON:  None. FINDINGS: Post sternotomy changes. Cardiomegaly with vascular congestion and mild diffuse interstitial opacity, likely edema. No pneumothorax. IMPRESSION: Cardiomegaly with vascular congestion and probable mild interstitial edema. Electronically Signed   By: Donavan Foil M.D.   On: 08/13/2020 21:32   CT Renal Stone Study  Result Date: 08/13/2020 CLINICAL DATA:  Flank pain, urinary retention EXAM: CT ABDOMEN AND PELVIS WITHOUT CONTRAST TECHNIQUE: Multidetector CT imaging of the abdomen and pelvis was performed following the standard protocol without IV contrast. COMPARISON:  None. FINDINGS: Lower chest: There is a trace right pleural effusion. Patchy areas of consolidation at the lung bases likely reflect atelectasis. No  pericardial effusion. Hepatobiliary: Small gallstones are seen layering dependently in the gallbladder. No evidence of acute cholecystitis. Unenhanced imaging of the liver is unremarkable. No biliary dilation. Pancreas: Unremarkable. No pancreatic ductal dilatation or surrounding inflammatory changes. Spleen: Normal in size without focal abnormality. Adrenals/Urinary Tract: No urinary tract calculi or obstructive  uropathy. Bladder is unremarkable. The adrenals are normal. Stomach/Bowel: No bowel obstruction or ileus. Normal appendix right lower quadrant. No bowel wall thickening or inflammatory change. Vascular/Lymphatic: Aortic atherosclerosis. No enlarged abdominal or pelvic lymph nodes. Reproductive: Degenerating uterine fibroid right fundal aspect measuring up to 3.6 cm. Otherwise the uterus and adnexal structures are unremarkable. Other: No free fluid or free gas. No abdominal wall hernia. Musculoskeletal: No acute or destructive bony lesions. Reconstructed images demonstrate no additional findings. IMPRESSION: 1. No urinary tract calculi or obstructive uropathy. 2. Trace right pleural effusion. 3. Cholelithiasis without evidence of acute cholecystitis. 4. Degenerating uterine fibroid. 5. Aortic Atherosclerosis (ICD10-I70.0). Electronically Signed   By: Randa Ngo M.D.   On: 08/13/2020 17:47    Procedures Procedures (including critical care time)  Medications Ordered in ED Medications  influenza vac split quadrivalent PF (FLUARIX) injection 0.5 mL (has no administration in time range)  atorvastatin (LIPITOR) tablet 40 mg (40 mg Oral Given 08/13/20 2312)  carvedilol (COREG) tablet 12.5 mg (12.5 mg Oral Given 08/13/20 2312)  amLODipine (NORVASC) tablet 10 mg (10 mg Oral Given 08/13/20 2312)  FLUoxetine (PROZAC) capsule 80 mg (80 mg Oral Given 08/13/20 2311)  acetaminophen (TYLENOL) tablet 325 mg (has no administration in time range)    Or  acetaminophen (TYLENOL) suppository 325 mg (has no  administration in time range)  ondansetron (ZOFRAN) tablet 4 mg (has no administration in time range)    Or  ondansetron (ZOFRAN) injection 4 mg (has no administration in time range)  pantoprazole (PROTONIX) EC tablet 40 mg (40 mg Oral Given 08/13/20 2311)  melatonin tablet 3 mg (3 mg Oral Given 08/13/20 2311)  morphine 4 MG/ML injection 4 mg (4 mg Intravenous Given 08/13/20 1625)  ondansetron (ZOFRAN) injection 4 mg (4 mg Intravenous Given 08/13/20 1625)  labetalol (NORMODYNE) injection 10 mg (10 mg Intravenous Given 08/13/20 2019)    ED Course  I have reviewed the triage vital signs and the nursing notes.  Pertinent labs & imaging results that were available during my care of the patient were reviewed by me and considered in my medical decision making (see chart for details).    MDM Rules/Calculators/A&P                         64 year old female initially presents complaining of flank pain and urinary retention although is unsure the last time she was able to use the bathroom.  Denies associated dysuria or hematuria, no prior history.  Family also reports history of chronic kidney disease.  On arrival patient is chronically ill-appearing but in no acute distress.  She is very hypertensive with initial blood pressure of 218/109.  Family is not exactly sure what blood pressure medication she is on but states she has been running out of most of her medications and is not sure if she has been taking them regularly.  She recently began living with her sister in October after moving from [place] where she was living with her son.  She has some right-sided abdominal pain without peritoneal signs and reports flank tenderness bilaterally.  On bladder scan patient only has 237 mL in bladder, so we will hold off and placing Foley especially without clear history of retention.  Will check basic lab work and urinalysis.  Will get CT renal stone study as well.  I have independently ordered, reviewed and  interpreted all labs and imaging: CBC: No leukocytosis, hemoglobin of 10.5 CMP: Glucose of 118 but no  other significant electrolyte derangements, creatinine of 3.72 with BUN of 65, LFTs unremarkable. No prior lab work available in system for comparison  Notified by nursing staff that patient got up and was able to go to the bathroom but did not tell anyone and no sample was collected.  Spoke with family regarding this, stressed the importance of obtaining a urine sample.  Reassured the patient does not appear to truly be having urinary retention.  CT renal study with no urinary tract calculi or obstructive uropathy, trace right pleural effusion and cholelithiasis without cholecystitis but no other acute abnormalities noted.  Degenerating uterine fibroid noted.  I was able to speak more with patient's sister and she was able to access patient's MyChart account from [place] where she had labs done in September 2021 with creatinine of 2.5 at this time.  I am concerned that patient has acute AKI on top of her CKD, no more recent labs to compare to.  Patient with very poorly controlled hypertension with blood pressures up to the 220s and based on reports from patient and sister, poor medication compliance.  Based on medications at bedside she is only on amlodipine for blood pressure management and 80 mg of furosemide twice daily which would likely worsen her kidney function.  Concerned with blood pressures and worsening kidney function and no outpatient follow-up currently she is at high risk for worsening kidney function.  Will give labetalol for hypertension and feel patient will likely need admission for hypertensive emergency with AKI.  Case discussed with Dr. Tobie Poet with [facility] hospitalist who will see and admit the patient with concern for hypertensive emergency.  After discussing case we will add CT head, troponins, EKG and chest x-ray to patient's evaluation.  Given it is unclear what patient's baseline  mental status is, and this could be related to hypertensive emergency as well.  Patient denies chest pain but given her undifferentiated abdominal pain EKG and troponins could be helpful as well.  Final Clinical Impression(s) / ED Diagnoses Final diagnoses:  Hypertensive emergency  Acute kidney injury superimposed on chronic kidney disease [facility])    Rx / DC Orders ED Discharge Orders    None       Jacqlyn Larsen, PA-C 08/14/20 0000    Deno Etienne, DO 08/14/20 1603
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
## 2020-08-13 NOTE — ED Triage Notes (Signed)
Pt presents with c/o flank pain and urinary retention. Pt reports that she has been unable to urinate since last night. Pt's family also reports that they are trying to get her evaluated by [facility] as well. Pt says that she has a hx of kidney stones but she does not believe this is a kidney stone.

## 2020-08-13 NOTE — ED Notes (Signed)
Took pt bp x2 informed RN

## 2020-08-14 ENCOUNTER — Observation Stay (HOSPITAL_BASED_OUTPATIENT_CLINIC_OR_DEPARTMENT_OTHER): Payer: Medicaid Other

## 2020-08-14 ENCOUNTER — Observation Stay (HOSPITAL_COMMUNITY): Payer: Medicaid Other

## 2020-08-14 DIAGNOSIS — I639 Cerebral infarction, unspecified: Secondary | ICD-10-CM | POA: Diagnosis not present

## 2020-08-14 DIAGNOSIS — E785 Hyperlipidemia, unspecified: Secondary | ICD-10-CM | POA: Diagnosis present

## 2020-08-14 DIAGNOSIS — F209 Schizophrenia, unspecified: Secondary | ICD-10-CM | POA: Diagnosis present

## 2020-08-14 DIAGNOSIS — D696 Thrombocytopenia, unspecified: Secondary | ICD-10-CM | POA: Diagnosis present

## 2020-08-14 DIAGNOSIS — Z23 Encounter for immunization: Secondary | ICD-10-CM | POA: Diagnosis not present

## 2020-08-14 DIAGNOSIS — Z6831 Body mass index (BMI) 31.0-31.9, adult: Secondary | ICD-10-CM | POA: Diagnosis not present

## 2020-08-14 DIAGNOSIS — G9341 Metabolic encephalopathy: Secondary | ICD-10-CM | POA: Diagnosis present

## 2020-08-14 DIAGNOSIS — N184 Chronic kidney disease, stage 4 (severe): Secondary | ICD-10-CM | POA: Diagnosis present

## 2020-08-14 DIAGNOSIS — I6381 Other cerebral infarction due to occlusion or stenosis of small artery: Secondary | ICD-10-CM | POA: Diagnosis present

## 2020-08-14 DIAGNOSIS — I160 Hypertensive urgency: Secondary | ICD-10-CM | POA: Diagnosis present

## 2020-08-14 DIAGNOSIS — I248 Other forms of acute ischemic heart disease: Secondary | ICD-10-CM | POA: Diagnosis present

## 2020-08-14 DIAGNOSIS — I130 Hypertensive heart and chronic kidney disease with heart failure and stage 1 through stage 4 chronic kidney disease, or unspecified chronic kidney disease: Secondary | ICD-10-CM | POA: Diagnosis present

## 2020-08-14 DIAGNOSIS — F32A Depression, unspecified: Secondary | ICD-10-CM

## 2020-08-14 DIAGNOSIS — Z8679 Personal history of other diseases of the circulatory system: Secondary | ICD-10-CM | POA: Diagnosis not present

## 2020-08-14 DIAGNOSIS — G8929 Other chronic pain: Secondary | ICD-10-CM | POA: Diagnosis present

## 2020-08-14 DIAGNOSIS — G4700 Insomnia, unspecified: Secondary | ICD-10-CM | POA: Diagnosis present

## 2020-08-14 DIAGNOSIS — R609 Edema, unspecified: Secondary | ICD-10-CM | POA: Diagnosis not present

## 2020-08-14 DIAGNOSIS — R109 Unspecified abdominal pain: Secondary | ICD-10-CM | POA: Diagnosis present

## 2020-08-14 DIAGNOSIS — I252 Old myocardial infarction: Secondary | ICD-10-CM | POA: Diagnosis not present

## 2020-08-14 DIAGNOSIS — I2510 Atherosclerotic heart disease of native coronary artery without angina pectoris: Secondary | ICD-10-CM | POA: Diagnosis present

## 2020-08-14 DIAGNOSIS — I5033 Acute on chronic diastolic (congestive) heart failure: Secondary | ICD-10-CM | POA: Diagnosis present

## 2020-08-14 DIAGNOSIS — I5030 Unspecified diastolic (congestive) heart failure: Secondary | ICD-10-CM | POA: Diagnosis not present

## 2020-08-14 DIAGNOSIS — N179 Acute kidney failure, unspecified: Secondary | ICD-10-CM | POA: Diagnosis present

## 2020-08-14 DIAGNOSIS — I10 Essential (primary) hypertension: Secondary | ICD-10-CM | POA: Diagnosis not present

## 2020-08-14 DIAGNOSIS — I161 Hypertensive emergency: Secondary | ICD-10-CM | POA: Diagnosis present

## 2020-08-14 DIAGNOSIS — Z20822 Contact with and (suspected) exposure to covid-19: Secondary | ICD-10-CM | POA: Diagnosis present

## 2020-08-14 DIAGNOSIS — E669 Obesity, unspecified: Secondary | ICD-10-CM | POA: Diagnosis present

## 2020-08-14 DIAGNOSIS — I340 Nonrheumatic mitral (valve) insufficiency: Secondary | ICD-10-CM | POA: Diagnosis present

## 2020-08-14 DIAGNOSIS — F419 Anxiety disorder, unspecified: Secondary | ICD-10-CM | POA: Diagnosis present

## 2020-08-14 DIAGNOSIS — D631 Anemia in chronic kidney disease: Secondary | ICD-10-CM | POA: Diagnosis present

## 2020-08-14 DIAGNOSIS — R413 Other amnesia: Secondary | ICD-10-CM | POA: Diagnosis present

## 2020-08-14 DIAGNOSIS — F0390 Unspecified dementia without behavioral disturbance: Secondary | ICD-10-CM | POA: Diagnosis present

## 2020-08-14 LAB — SARS CORONAVIRUS 2 (TAT 6-24 HRS): SARS Coronavirus 2: NEGATIVE

## 2020-08-14 LAB — TSH: TSH: 2.066 u[IU]/mL (ref 0.350–4.500)

## 2020-08-14 LAB — GLUCOSE, CAPILLARY
Glucose-Capillary: 112 mg/dL — ABNORMAL HIGH (ref 70–99)
Glucose-Capillary: 108 mg/dL — ABNORMAL HIGH (ref 70–99)
Glucose-Capillary: 127 mg/dL — ABNORMAL HIGH (ref 70–99)
Glucose-Capillary: 118 mg/dL — ABNORMAL HIGH (ref 70–99)
Glucose-Capillary: 176 mg/dL — ABNORMAL HIGH (ref 70–99)

## 2020-08-14 LAB — BASIC METABOLIC PANEL
Anion gap: 10 (ref 5–15)
BUN: 62 mg/dL — ABNORMAL HIGH (ref 8–23)
CO2: 20 mmol/L — ABNORMAL LOW (ref 22–32)
Calcium: 9.2 mg/dL (ref 8.9–10.3)
Chloride: 110 mmol/L (ref 98–111)
Creatinine, Ser: 3.85 mg/dL — ABNORMAL HIGH (ref 0.44–1.00)
GFR, Estimated: 13 mL/min — ABNORMAL LOW (ref >60)
Glucose, Bld: 119 mg/dL — ABNORMAL HIGH (ref 70–99)
Potassium: 4.7 mmol/L (ref 3.5–5.1)
Sodium: 140 mmol/L (ref 135–145)

## 2020-08-14 LAB — CBC
HCT: 29.3 % — ABNORMAL LOW (ref 36.0–46.0)
Hemoglobin: 9.4 g/dL — ABNORMAL LOW (ref 12.0–15.0)
MCH: 29.3 pg (ref 26.0–34.0)
MCHC: 32.1 g/dL (ref 30.0–36.0)
MCV: 91.3 fL (ref 80.0–100.0)
Platelets: 141 10*3/uL — ABNORMAL LOW (ref 150–400)
RBC: 3.21 MIL/uL — ABNORMAL LOW (ref 3.87–5.11)
RDW: 15 % (ref 11.5–15.5)
WBC: 7 10*3/uL (ref 4.0–10.5)
nRBC: 0 % (ref 0.0–0.2)

## 2020-08-14 LAB — ECHOCARDIOGRAM COMPLETE
Area-P 1/2: 4.15 cm2
Height: 63 in
MV M vel: 4.9 m/s
MV Peak grad: 96 mmHg
Radius: 0.6 cm
S' Lateral: 3.2 cm
Weight: 2880 oz

## 2020-08-14 LAB — VITAMIN B12: Vitamin B-12: 420 pg/mL (ref 180–914)

## 2020-08-14 LAB — MRSA PCR SCREENING: MRSA by PCR: NEGATIVE

## 2020-08-14 LAB — TROPONIN I (HIGH SENSITIVITY): Troponin I (High Sensitivity): 21 ng/L — ABNORMAL HIGH (ref <18)

## 2020-08-14 MED ORDER — CLOPIDOGREL BISULFATE 75 MG PO TABS
75.0000 mg | ORAL_TABLET | Freq: Every day | ORAL | Status: DC
  Administered 2020-08-14 – 2020-08-26 (×13): 75 mg via ORAL
  Filled 2020-08-14 (×13): qty 1

## 2020-08-14 MED ORDER — HEPARIN SODIUM (PORCINE) 5000 UNIT/ML IJ SOLN
5000.0000 [IU] | Freq: Three times a day (TID) | INTRAMUSCULAR | Status: DC
  Administered 2020-08-14 – 2020-08-25 (×35): 5000 [IU] via SUBCUTANEOUS
  Filled 2020-08-14 (×35): qty 1

## 2020-08-14 MED ORDER — FUROSEMIDE 10 MG/ML IJ SOLN
60.0000 mg | Freq: Once | INTRAMUSCULAR | Status: AC
  Administered 2020-08-14: 14:00:00 60 mg via INTRAVENOUS
  Filled 2020-08-14: qty 6

## 2020-08-14 MED ORDER — OXYCODONE HCL 5 MG PO TABS
5.0000 mg | ORAL_TABLET | Freq: Four times a day (QID) | ORAL | Status: DC | PRN
  Administered 2020-08-14 – 2020-08-25 (×10): 5 mg via ORAL
  Filled 2020-08-14 (×11): qty 1

## 2020-08-14 MED ORDER — LABETALOL HCL 5 MG/ML IV SOLN
10.0000 mg | Freq: Once | INTRAVENOUS | Status: AC
  Administered 2020-08-14: 04:00:00 10 mg via INTRAVENOUS
  Filled 2020-08-14: qty 4

## 2020-08-14 MED ORDER — CHLORHEXIDINE GLUCONATE CLOTH 2 % EX PADS
6.0000 | MEDICATED_PAD | Freq: Every day | CUTANEOUS | Status: DC
  Administered 2020-08-14 – 2020-08-25 (×5): 6 via TOPICAL

## 2020-08-14 MED ORDER — FUROSEMIDE 10 MG/ML IJ SOLN
60.0000 mg | Freq: Once | INTRAMUSCULAR | Status: AC
  Administered 2020-08-14: 60 mg via INTRAVENOUS
  Filled 2020-08-14: qty 8

## 2020-08-14 MED ORDER — ASPIRIN EC 81 MG PO TBEC
81.0000 mg | DELAYED_RELEASE_TABLET | Freq: Every day | ORAL | Status: DC
  Administered 2020-08-14 – 2020-08-26 (×13): 81 mg via ORAL
  Filled 2020-08-14 (×13): qty 1

## 2020-08-14 NOTE — Progress Notes (Signed)
Bilateral lower extremity venous duplex has been completed. Preliminary results can be found in CV Proc through chart review.   08/14/20 10:47 AM Christina Nunez RVT

## 2020-08-14 NOTE — ED Notes (Signed)
Bladder scan reveals 242 mL urine in bladder at this time.  Pt not distended, but reports some urge to urinate.

## 2020-08-14 NOTE — Evaluation (Signed)
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   Clinical Impression   Patient lives with her sister in apartment, sister works so patient is home alone during the day. Patient reports she is mostly independent, does have assist in/out of tub shower and for bathing, does not use any AD. Patient grossly oriented however does appear to have some memory deficits as she is unable to recall when her R flank pain initialyl started. Patient did not require any physical assistance from OT for ADLs/functional transfers assessed during evaluation, acute OT services will sign off. Please re-consult if new needs arise.    Follow Up Recommendations  No OT follow up    Equipment Recommendations  None recommended by OT       Precautions / Restrictions Restrictions Weight Bearing Restrictions: No      Mobility Bed Mobility Overal bed mobility: Modified Independent                  Transfers Overall transfer level: Modified independent Equipment used: None             General transfer comment: patient able to transfer to recliner without physical assistance    Balance Overall balance assessment: No apparent balance deficits (not formally assessed)                                         ADL either performed or assessed with clinical judgement   ADL Overall ADL's : At baseline                                       General ADL Comments: patient is able to perform LB dressing, functional transfer and seated g/h without physical  assistance. patient does experience stabbing pains in R flank throughout evaluation and cannot recall how long this has been going on for.                  Pertinent Vitals/Pain Pain Assessment: Faces Faces Pain Scale: Hurts little more Pain Location: R flank Pain Descriptors / Indicators: Grimacing;Discomfort;Sharp;Stabbing Pain Intervention(s): Monitored during session     Hand Dominance Right   Extremity/Trunk Assessment Upper Extremity Assessment Upper Extremity Assessment: Overall WFL for tasks assessed   Lower Extremity Assessment Lower Extremity Assessment: Defer to PT evaluation   Cervical / Trunk Assessment Cervical / Trunk Assessment: Normal   Communication Communication Communication: No difficulties   Cognition Arousal/Alertness: Awake/alert Behavior During Therapy: WFL for tasks assessed/performed Overall Cognitive Status: No family/caregiver present to determine baseline cognitive functioning                                 General Comments: patient A/O x3, answering questions appropriately but does have some difficulty with recalling info such as how long her flank pain as been occuring   General Comments  BP taken semi-supine 160/88(113), taken once seated in chair 146/71(99)  denies any reports of dizziness            Home Living Family/patient expects to be discharged to:: Private residence Living Arrangements: Other relatives (sister) Available Help at Discharge: Family;Available PRN/intermittently Type of Home: Apartment Home Access: Stairs to enter Entrance Stairs-Number of Steps: 6   Home Layout: Two level;Bed/bath upstairs Alternate Level Stairs-Number of Steps: 12   Bathroom Shower/Tub: Teacher, early years/pre: Standard     Home Equipment: None          Prior Functioning/Environment Level of Independence: Needs assistance    ADL's / Homemaking Assistance Needed: assist getting in/out of tub and with  bathing            OT Problem List: Pain         OT Goals(Current goals can be found in the care plan section) Acute Rehab OT Goals Patient Stated Goal: eat breakfast OT Goal Formulation: All assessment and education complete, DC therapy   AM-PAC OT "6 Clicks" Daily Activity     Outcome Measure Help from another person eating meals?: None Help from another person taking care of personal grooming?: None Help from another person toileting, which includes using toliet, bedpan, or urinal?: None Help from another person bathing (including washing, rinsing, drying)?: A Little Help from another person to put on and taking off regular upper body clothing?: None Help from another person to put on and taking off regular lower body clothing?: None 6 Click Score: 23   End of Session Nurse Communication: Mobility status;Other (comment) (BP)  Activity Tolerance: Patient tolerated treatment well Patient left: in chair;with call bell/phone within reach;with chair alarm set  OT Visit Diagnosis: Pain Pain - Right/Left: Right Pain - part of body:  (flank)                Time: 0626-9485 OT Time Calculation (min): 24 min Charges:  OT General Charges $OT Visit: 1 Visit OT Evaluation $OT Eval Moderate Complexity: [place] OT OT pager: [place] 08/14/2020, 10:40 AM

## 2020-08-14 NOTE — Evaluation (Signed)
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  Pt admitted for hypertensive urgency in the setting of established hypertension and recent noncompliance.  Clinical Impression  Pt admitted with above diagnosis.  Pt currently with functional limitations due to the deficits listed below (see PT Problem List). Pt will benefit from skilled PT to increase their independence and safety with mobility to allow discharge to the venue listed below.  Pt assisted with ambulating and very unsteady, ataxic gait.  Pt typically does not use assistive device at baseline.  Recommend SNF if family cannot provide 24/7 assist initially as pt presents as high fall risk.     Follow Up Recommendations SNF    Equipment Recommendations  Rolling walker with 5" wheels    Recommendations for Other Services       Precautions / Restrictions Precautions Precautions: Fall Precaution Comments: monitor BP Restrictions Weight Bearing Restrictions: No      Mobility  Bed Mobility Overal bed mobility: Modified Independent             General bed mobility comments: increased time    Transfers Overall transfer level: Needs assistance Equipment used: None Transfers: Sit to/from Stand Sit to Stand: Min guard         General transfer comment: supervision for safety, pt unsteady with rise, utilized gait belt for safety  Ambulation/Gait Ambulation/Gait assistance: Min assist Gait Distance (Feet): 60 Feet Assistive device: None Gait Pattern/deviations: Ataxic;Drifts right/left;Shuffle Gait  velocity: decr   General Gait Details: verbal cues for safety, pt declined using RW however would benefit, occasionally holding hand rail, very unsteady and required assist for stability  Stairs            Wheelchair Mobility    Modified Rankin (Stroke Patients Only)       Balance Overall balance assessment: Needs assistance         Standing balance support: No upper extremity supported Standing balance-Leahy Scale: Fair Standing balance comment: static fair, dynamic poor                             Pertinent Vitals/Pain Pain Assessment: Faces Faces Pain Scale: No hurt Pain Location: R flank Pain Descriptors / Indicators: Grimacing;Discomfort;Sharp;Stabbing Pain Intervention(s): Monitored during session;Repositioned    Home Living Family/patient expects to be discharged to:: Private residence Living Arrangements: Other relatives (sister) Available Help at Discharge: Family;Available PRN/intermittently Type of Home: Apartment Home Access: Stairs to enter   Entrance Stairs-Number of Steps: 6 Home Layout: Two level;Bed/bath upstairs Home Equipment: None      Prior Function Level of Independence: Needs assistance   Gait / Transfers Assistance Needed: ambulatory without assistive device  ADL's / Homemaking Assistance Needed: assist getting in/out of tub and with bathing        Hand Dominance   Dominant Hand: Right    Extremity/Trunk Assessment   Upper Extremity Assessment Upper Extremity Assessment: Overall WFL for tasks assessed    Lower Extremity Assessment Lower Extremity Assessment: Generalized weakness    Cervical / Trunk Assessment Cervical / Trunk Assessment: Normal  Communication   Communication: No difficulties  Cognition Arousal/Alertness: Awake/alert Behavior During Therapy: Flat affect Overall Cognitive Status: No family/caregiver present to determine baseline cognitive functioning                                  General Comments: "?dementia" per chart review      General Comments General comments (skin integrity, edema, etc.): BP prior to session 125/73 mmHg, vitals end of session: 134/76 mmHg, 65bpm, 98% room air    Exercises     Assessment/Plan    PT Assessment Patient needs continued PT services  PT Problem List Decreased strength;Decreased activity tolerance;Decreased balance;Decreased knowledge of use of DME;Decreased mobility;Decreased coordination;Decreased safety awareness;Decreased cognition       PT Treatment Interventions Gait training;DME instruction;Therapeutic exercise;Balance training;Functional mobility training;Therapeutic activities;Patient/family education;Neuromuscular re-education    PT Goals (Current goals can be found in the Care Plan section)  Acute Rehab PT Goals Patient Stated Goal: eat breakfast PT Goal Formulation: With patient Time For Goal Achievement: 08/21/20 Potential to Achieve Goals: Good    Frequency Min 2X/week   Barriers to discharge        Co-evaluation               AM-PAC PT "6 Clicks" Mobility  Outcome Measure Help needed turning from your back to your side while in a flat bed without using bedrails?: A Little Help needed moving from lying on your back to sitting on the side of a flat bed without using bedrails?: A Little Help needed moving to and from a bed to a chair (including a wheelchair)?: A Little Help needed standing up from a chair using your arms (e.g., wheelchair or bedside chair)?: A Little Help needed to walk in hospital room?: A Little Help needed climbing 3-5 steps with a railing? : A Little 6 Click Score: 18    End of Session Equipment Utilized During Treatment: Gait belt Activity Tolerance: Patient tolerated treatment well Patient left: in chair;with call bell/phone within reach;with chair alarm set Nurse Communication: Mobility status PT Visit Diagnosis: Other abnormalities of gait and mobility  (R26.89);Unsteadiness on feet (R26.81)    Time: 3244-0102 PT Time Calculation (min) (ACUTE ONLY): 19 min   Charges:   PT Evaluation $PT Eval Low Complexity: 1 Low        Kati PT, DPT Acute Rehabilitation Services Pager: [phone] Office: [phone]  York Ram E 08/14/2020, 1:03 PM

## 2020-08-14 NOTE — Progress Notes (Addendum)
Patient ID: Christina Nunez, female   DOB: 1955-10-22, 64 y.o.   MRN: [id number]  PROGRESS NOTE    Tamirah George  NWG:[id number] DOB: [birth year] DOA: 08/13/2020 PCP: Medicine, [facility]   Brief Narrative:  64 year old female who recently moved to [place] from [place] with history of hypertension,?  Dementia, depression/anxiety, likely CABG (sternotomy wires present on chest x-ray),?  Chronic any disease, recent noncompliance to medications including antihypertensives presented on 08/13/2020 with low back pain/abdominal pain weakness and imbalance.  On presentation, she was slightly confused.  Sister had mentioned that patient at baseline has a tic which was unchanged.  She was found to have hypertensive urgency with initial blood pressure of 218/109.  She was kept under observation  Assessment & Plan:  Hypertensive urgency in the setting of established hypertension and recent noncompliance -Patient has been noncompliant with her medications recently after moving to [facility] from [place] since early October 2 121 -Presented with blood pressure of 218/109.  Blood pressure improving.  Monitor.  Continue amlodipine, Coreg -TOC consult: Patient does not have a PCP in this area.  Weakness and dizziness -Possibly from above.  CT of the head was negative for acute intracranial abnormality but shows chronic small vessel disease -PT eval. -Vitamin B12 420; TSH normal.  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  creatinine 3.85 this morning.  CT renal stone study showed no urinary tract or obstructive uropathy.  Chest x-ray on presentation had shown cardiomegaly with vascular congestion and probable mild interstitial edema.  Strict input and output.  Daily weights.  Patient received a dose of IV Lasix in the ED.  Will order 1 more dose of IV Lasix today.  Follow echocardiogram.  History of ischemic heart disease with  CABG? -Anterior chest wall scar consistent with history of CABG surgery -Continue statin, Coreg.  Will resume aspirin and Plavix.  Will need outpatient cardiology follow-up  Left lower extremity pain -Ultrasound negative for DVT  Thrombocytopenia -Questionable cause.  Monitor  GERD -Continue pantoprazole  Depression/anxiety -Continue fluoxetine  Generalized deconditioning ?  Dementia -PT/OT eval.  Patient will need neurology evaluation as an outpatient for question of dementia   DVT prophylaxis: Subcutaneous heparin Code Status: Full Family Communication: Called sister on phone on 08/14/20 but she didn't pick up. Disposition Plan: Status is: Observation  The patient will require care spanning > 2 midnights and should be moved to inpatient because: Inpatient level of care appropriate due to severity of illness  Dispo: The patient is from: Home              Anticipated d/c is to: Home              Anticipated d/c date is: 2 days              Patient currently is not medically stable to d/c.  Consultants: None  Procedures: Echo report pending  Antimicrobials: None   Subjective: Patient seen and examined at bedside.  Awake, slow to respond, poor historian.  No overnight fever, chest pain or vomiting reported  Objective: Vitals:   08/14/20 0400 08/14/20 0756 08/14/20 0800 08/14/20 0802  BP: (!) 183/86  (!) 154/87 (!) 154/87  Pulse: 70 66 68 72  Resp: 14 (!) 21 20   Temp: 98.3 F (36.8 C)  (!) 97.3 F (36.3 C)   TempSrc: Oral  Oral   SpO2: 98% 98% 99%   Weight:  Height:        Intake/Output Summary (Last 24 hours) at 08/14/2020 1149 Last data filed at 08/14/2020 0827 Gross per 24 hour  Intake --  Output 475 ml  Net -475 ml   Filed Weights   08/13/20 1508  Weight: 81.6 kg    Examination:  General exam: Appears calm and comfortable.  Looks older than stated age.  No distress. Respiratory system: Bilateral decreased breath sounds at bases with some  scattered crackles Cardiovascular system: S1 & S2 heard, Rate controlled Gastrointestinal system: Abdomen is nondistended, soft and nontender. Normal bowel sounds heard. Extremities: No cyanosis, clubbing; trace lower extremity edema Central nervous system: Awake, very slow to respond but oriented. No focal neurological deficits. Moving extremities.  Facial tic present Skin: No rashes, lesions or ulcers Psychiatry: Flat affect    Data Reviewed: I have personally reviewed following labs and imaging studies  CBC: Recent Labs  Lab 08/13/20 1618 08/14/20 0719  WBC 8.0 7.0  NEUTROABS 4.3  --   HGB 10.5* 9.4*  HCT 32.8* 29.3*  MCV 89.6 91.3  PLT 167 030*   Basic Metabolic Panel: Recent Labs  Lab 08/13/20 1618 08/14/20 0719  NA 139 140  K 4.4 4.7  CL 109 110  CO2 22 20*  GLUCOSE 118* 119*  BUN 65* 62*  CREATININE 3.72* 3.85*  CALCIUM 9.2 9.2   GFR: Estimated Creatinine Clearance: 15.1 mL/min (A) (by C-G formula based on SCr of 3.85 mg/dL (H)). Liver Function Tests: Recent Labs  Lab 08/13/20 1618  AST 14*  ALT 15  ALKPHOS 68  BILITOT 0.4  PROT 7.4  ALBUMIN 3.8   No results for input(s): LIPASE, AMYLASE in the last 168 hours. No results for input(s): AMMONIA in the last 168 hours. Coagulation Profile: No results for input(s): INR, PROTIME in the last 168 hours. Cardiac Enzymes: No results for input(s): CKTOTAL, CKMB, CKMBINDEX, TROPONINI in the last 168 hours. BNP (last 3 results) No results for input(s): PROBNP in the last 8760 hours. HbA1C: Recent Labs    08/13/20 1618  HGBA1C 6.3*   CBG: Recent Labs  Lab 08/14/20 0405 08/14/20 0822  GLUCAP 112* 108*   Lipid Profile: No results for input(s): CHOL, HDL, LDLCALC, TRIG, CHOLHDL, LDLDIRECT in the last 72 hours. Thyroid Function Tests: Recent Labs    08/13/20 2328  TSH 2.066   Anemia Panel: Recent Labs    08/13/20 2328  VITAMINB12 420   Sepsis Labs: No results for input(s): PROCALCITON,  LATICACIDVEN in the last 168 hours.  Recent Results (from the past 240 hour(s))  SARS CORONAVIRUS 2 (TAT 6-24 HRS) Nasopharyngeal Nasopharyngeal Swab     Status: None   Collection Time: 08/13/20  8:22 PM   Specimen: Nasopharyngeal Swab  Result Value Ref Range Status   SARS Coronavirus 2 NEGATIVE NEGATIVE Final    Comment: (NOTE) SARS-CoV-2 target nucleic acids are NOT DETECTED.  The SARS-CoV-2 RNA is generally detectable in upper and lower respiratory specimens during the acute phase of infection. Negative results do not preclude SARS-CoV-2 infection, do not rule out co-infections with other pathogens, and should not be used as the sole basis for treatment or other patient management decisions. Negative results must be combined with clinical observations, patient history, and epidemiological information. The expected result is Negative.  Fact Sheet for Patients: SugarRoll.be  Fact Sheet for Healthcare Providers: https://www.woods-mathews.com/  This test is not yet approved or cleared by the [place] FDA and  has been authorized for detection and/or diagnosis  of SARS-CoV-2 by FDA under an Emergency Use Authorization (EUA). This EUA will remain  in effect (meaning this test can be used) for the duration of the COVID-19 declaration under Se ction 564(b)(1) of the Act, 21 U.S.C. section 360bbb-3(b)(1), unless the authorization is terminated or revoked sooner.  Performed at [facility], [place] [address], [place], [place] [address]   MRSA PCR Screening     Status: None   Collection Time: 08/14/20  3:38 AM   Specimen: Nasopharyngeal  Result Value Ref Range Status   MRSA by PCR NEGATIVE NEGATIVE Final    Comment:        The GeneXpert MRSA Assay (FDA approved for NASAL specimens only), is one component of a comprehensive MRSA colonization surveillance program. It is not intended to diagnose MRSA infection nor to guide  or monitor treatment for MRSA infections. Performed at [facility], [place] [address] [place], [place] [address]          Radiology Studies: CT Head Wo Contrast  Result Date: 08/13/2020 CLINICAL DATA:  Encephalopathy EXAM: CT HEAD WITHOUT CONTRAST TECHNIQUE: Contiguous axial images were obtained from the base of the skull through the vertex without intravenous contrast. COMPARISON:  None. FINDINGS: Brain: There is no mass, hemorrhage or extra-axial collection. The size and configuration of the ventricles and extra-axial CSF spaces are normal. There is hypoattenuation of the white matter, most commonly indicating chronic small vessel disease. Vascular: No abnormal hyperdensity of the major intracranial arteries or dural venous sinuses. No intracranial atherosclerosis. Skull: The visualized skull base, calvarium and extracranial soft tissues are normal. Sinuses/Orbits: No fluid levels or advanced mucosal thickening of the visualized paranasal sinuses. No mastoid or middle ear effusion. The orbits are normal. IMPRESSION: Chronic small vessel disease without acute intracranial abnormality. Electronically Signed   By: Ulyses Jarred M.D.   On: 08/13/2020 23:24   DG Chest Portable 1 View  Result Date: 08/13/2020 CLINICAL DATA:  Abdomen pain hypertension EXAM: PORTABLE CHEST 1 VIEW COMPARISON:  None. FINDINGS: Post sternotomy changes. Cardiomegaly with vascular congestion and mild diffuse interstitial opacity, likely edema. No pneumothorax. IMPRESSION: Cardiomegaly with vascular congestion and probable mild interstitial edema. Electronically Signed   By: Donavan Foil M.D.   On: 08/13/2020 21:32   CT Renal Stone Study  Result Date: 08/13/2020 CLINICAL DATA:  Flank pain, urinary retention EXAM: CT ABDOMEN AND PELVIS WITHOUT CONTRAST TECHNIQUE: Multidetector CT imaging of the abdomen and pelvis was performed following the standard protocol without IV contrast. COMPARISON:  None.  FINDINGS: Lower chest: There is a trace right pleural effusion. Patchy areas of consolidation at the lung bases likely reflect atelectasis. No pericardial effusion. Hepatobiliary: Small gallstones are seen layering dependently in the gallbladder. No evidence of acute cholecystitis. Unenhanced imaging of the liver is unremarkable. No biliary dilation. Pancreas: Unremarkable. No pancreatic ductal dilatation or surrounding inflammatory changes. Spleen: Normal in size without focal abnormality. Adrenals/Urinary Tract: No urinary tract calculi or obstructive uropathy. Bladder is unremarkable. The adrenals are normal. Stomach/Bowel: No bowel obstruction or ileus. Normal appendix right lower quadrant. No bowel wall thickening or inflammatory change. Vascular/Lymphatic: Aortic atherosclerosis. No enlarged abdominal or pelvic lymph nodes. Reproductive: Degenerating uterine fibroid right fundal aspect measuring up to 3.6 cm. Otherwise the uterus and adnexal structures are unremarkable. Other: No free fluid or free gas. No abdominal wall hernia. Musculoskeletal: No acute or destructive bony lesions. Reconstructed images demonstrate no additional findings. IMPRESSION: 1. No urinary tract calculi or obstructive uropathy. 2. Trace right  pleural effusion. 3. Cholelithiasis without evidence of acute cholecystitis. 4. Degenerating uterine fibroid. 5. Aortic Atherosclerosis (ICD10-I70.0). Electronically Signed   By: Randa Ngo M.D.   On: 08/13/2020 17:47   VAS [place] LOWER EXTREMITY VENOUS (DVT)  Result Date: 08/14/2020  Lower Venous DVT Study Indications: Edema.  Risk Factors: None identified. Comparison Study: No prior studies. Performing Technologist: Oliver Hum RVT  Examination Guidelines: A complete evaluation includes B-mode imaging, spectral Doppler, color Doppler, and power Doppler as needed of all accessible portions of each vessel. Bilateral testing is considered an integral part of a complete examination. Limited  examinations for reoccurring indications may be performed as noted. The reflux portion of the exam is performed with the patient in reverse Trendelenburg.  +---------+---------------+---------+-----------+----------+--------------+  RIGHT     Compressibility Phasicity Spontaneity Properties Thrombus Aging  +---------+---------------+---------+-----------+----------+--------------+  CFV       Full            Yes       Yes                                    +---------+---------------+---------+-----------+----------+--------------+  SFJ       Full                                                             +---------+---------------+---------+-----------+----------+--------------+  FV Prox   Full                                                             +---------+---------------+---------+-----------+----------+--------------+  FV Mid    Full                                                             +---------+---------------+---------+-----------+----------+--------------+  FV Distal Full                                                             +---------+---------------+---------+-----------+----------+--------------+  PFV       Full                                                             +---------+---------------+---------+-----------+----------+--------------+  POP       Full            Yes       Yes                                    +---------+---------------+---------+-----------+----------+--------------+  PTV       Full                                                             +---------+---------------+---------+-----------+----------+--------------+  PERO      Full                                                             +---------+---------------+---------+-----------+----------+--------------+   +---------+---------------+---------+-----------+----------+--------------+  LEFT      Compressibility Phasicity Spontaneity Properties Thrombus Aging   +---------+---------------+---------+-----------+----------+--------------+  CFV       Full            Yes       Yes                                    +---------+---------------+---------+-----------+----------+--------------+  SFJ       Full                                                             +---------+---------------+---------+-----------+----------+--------------+  FV Prox   Full                                                             +---------+---------------+---------+-----------+----------+--------------+  FV Mid    Full                                                             +---------+---------------+---------+-----------+----------+--------------+  FV Distal Full                                                             +---------+---------------+---------+-----------+----------+--------------+  PFV       Full                                                             +---------+---------------+---------+-----------+----------+--------------+  POP       Full            Yes       Yes                                    +---------+---------------+---------+-----------+----------+--------------+  PTV       Full                                                             +---------+---------------+---------+-----------+----------+--------------+  PERO      Full                                                             +---------+---------------+---------+-----------+----------+--------------+     Summary: RIGHT: - There is no evidence of deep vein thrombosis in the lower extremity.  - No cystic structure found in the popliteal fossa.  LEFT: - There is no evidence of deep vein thrombosis in the lower extremity.  - No cystic structure found in the popliteal fossa.  *See table(s) above for measurements and observations.    Preliminary         Scheduled Meds:  amLODipine  10 mg Oral Daily   atorvastatin  40 mg Oral Daily   carvedilol  12.5 mg Oral BID WC   Chlorhexidine  Gluconate Cloth  6 each Topical Daily   FLUoxetine  80 mg Oral Daily   heparin injection (subcutaneous)  5,000 Units Subcutaneous Q8H   influenza vac split quadrivalent PF  0.5 mL Intramuscular Tomorrow-1000   melatonin  3 mg Oral QHS   pantoprazole  40 mg Oral Daily   Continuous Infusions:        Aline August, MD [facility] 08/14/2020, 11:49 AM

## 2020-08-14 NOTE — Progress Notes (Signed)
  Echocardiogram 2D Echocardiogram has been performed.  Jennette Dubin 08/14/2020, 11:34 AM

## 2020-08-14 NOTE — Plan of Care (Signed)

## 2020-08-14 NOTE — ED Notes (Signed)
Report given to [facility], Therapist, sports.  Pt SBAR information covered in report.  Receiving nurse requesting bladder scan prior to moving pt upstairs.  Bladder scan being completed by nurse tech at this time.  No further information need at this time.

## 2020-08-15 ENCOUNTER — Encounter (HOSPITAL_COMMUNITY): Payer: Self-pay | Admitting: Internal Medicine

## 2020-08-15 ENCOUNTER — Other Ambulatory Visit: Payer: Self-pay

## 2020-08-15 ENCOUNTER — Inpatient Hospital Stay (HOSPITAL_COMMUNITY): Payer: Medicaid Other

## 2020-08-15 DIAGNOSIS — I161 Hypertensive emergency: Secondary | ICD-10-CM | POA: Diagnosis not present

## 2020-08-15 LAB — CBC WITH DIFFERENTIAL/PLATELET
Abs Immature Granulocytes: 0.02 10*3/uL (ref 0.00–0.07)
Basophils Absolute: 0 10*3/uL (ref 0.0–0.1)
Basophils Relative: 0 %
Eosinophils Absolute: 0.3 10*3/uL (ref 0.0–0.5)
Eosinophils Relative: 3 %
HCT: 29.2 % — ABNORMAL LOW (ref 36.0–46.0)
Hemoglobin: 9.4 g/dL — ABNORMAL LOW (ref 12.0–15.0)
Immature Granulocytes: 0 %
Lymphocytes Relative: 14 %
Lymphs Abs: 1.3 10*3/uL (ref 0.7–4.0)
MCH: 28.9 pg (ref 26.0–34.0)
MCHC: 32.2 g/dL (ref 30.0–36.0)
MCV: 89.8 fL (ref 80.0–100.0)
Monocytes Absolute: 1.1 10*3/uL — ABNORMAL HIGH (ref 0.1–1.0)
Monocytes Relative: 12 %
Neutro Abs: 6.4 10*3/uL (ref 1.7–7.7)
Neutrophils Relative %: 71 %
Platelets: 139 10*3/uL — ABNORMAL LOW (ref 150–400)
RBC: 3.25 MIL/uL — ABNORMAL LOW (ref 3.87–5.11)
RDW: 15.1 % (ref 11.5–15.5)
WBC: 9.1 10*3/uL (ref 4.0–10.5)
nRBC: 0 % (ref 0.0–0.2)

## 2020-08-15 LAB — MAGNESIUM: Magnesium: 2 mg/dL (ref 1.7–2.4)

## 2020-08-15 LAB — COMPREHENSIVE METABOLIC PANEL
ALT: 11 U/L (ref 0–44)
AST: 9 U/L — ABNORMAL LOW (ref 15–41)
Albumin: 3.2 g/dL — ABNORMAL LOW (ref 3.5–5.0)
Alkaline Phosphatase: 51 U/L (ref 38–126)
Anion gap: 11 (ref 5–15)
BUN: 67 mg/dL — ABNORMAL HIGH (ref 8–23)
CO2: 18 mmol/L — ABNORMAL LOW (ref 22–32)
Calcium: 8.7 mg/dL — ABNORMAL LOW (ref 8.9–10.3)
Chloride: 108 mmol/L (ref 98–111)
Creatinine, Ser: 4.4 mg/dL — ABNORMAL HIGH (ref 0.44–1.00)
GFR, Estimated: 11 mL/min — ABNORMAL LOW (ref >60)
Glucose, Bld: 115 mg/dL — ABNORMAL HIGH (ref 70–99)
Potassium: 4.7 mmol/L (ref 3.5–5.1)
Sodium: 137 mmol/L (ref 135–145)
Total Bilirubin: 0.5 mg/dL (ref 0.3–1.2)
Total Protein: 6.5 g/dL (ref 6.5–8.1)

## 2020-08-15 LAB — CREATININE, URINE, RANDOM: Creatinine, Urine: 49.92 mg/dL

## 2020-08-15 LAB — PROTEIN / CREATININE RATIO, URINE
Creatinine, Urine: 50.27 mg/dL
Protein Creatinine Ratio: 2.73 mg/mg{Cre} — ABNORMAL HIGH (ref 0.00–0.15)
Total Protein, Urine: 137 mg/dL

## 2020-08-15 LAB — GLUCOSE, CAPILLARY
Glucose-Capillary: 103 mg/dL — ABNORMAL HIGH (ref 70–99)
Glucose-Capillary: 113 mg/dL — ABNORMAL HIGH (ref 70–99)

## 2020-08-15 LAB — PHOSPHORUS: Phosphorus: 6 mg/dL — ABNORMAL HIGH (ref 2.5–4.6)

## 2020-08-15 LAB — HIV ANTIBODY (ROUTINE TESTING W REFLEX): HIV Screen 4th Generation wRfx: NONREACTIVE

## 2020-08-15 LAB — SODIUM, URINE, RANDOM: Sodium, Ur: 69 mmol/L

## 2020-08-15 IMAGING — US US RENAL
1 series · 14 of 25 positions shown · non-contrast
Comparison: [DATE]

CLINICAL DATA: Evaluate etiology of renal failure.

EXAM:
RENAL / URINARY TRACT ULTRASOUND COMPLETE

[Series 1: us renal · 14 of 27 slices shown]
[im 1/27]
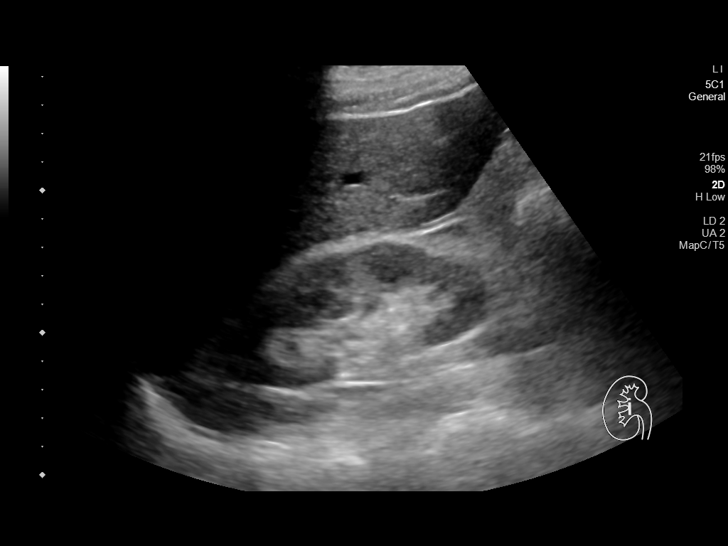
[im 3/27]
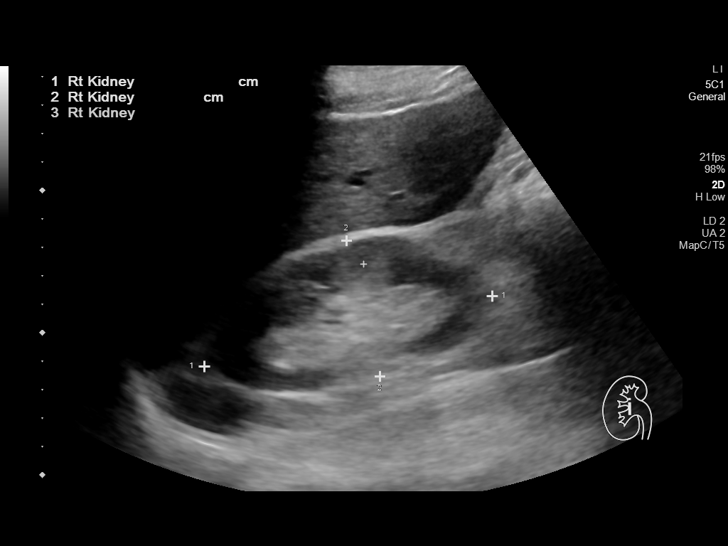
[im 5/27]
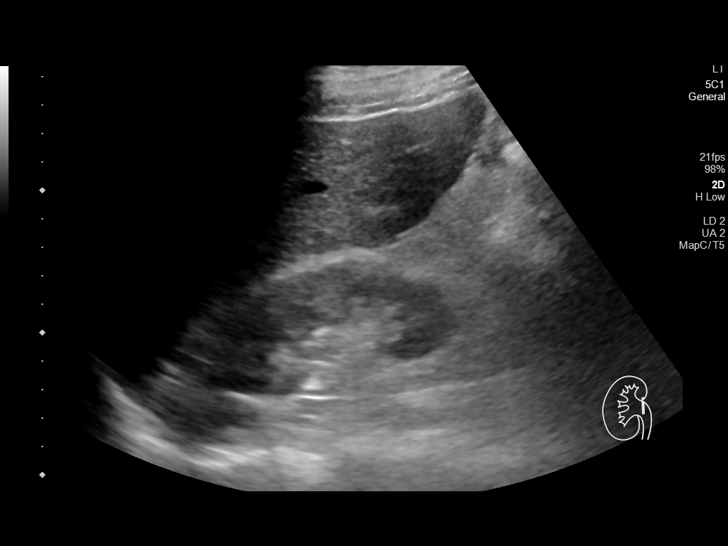
[im 7/27]
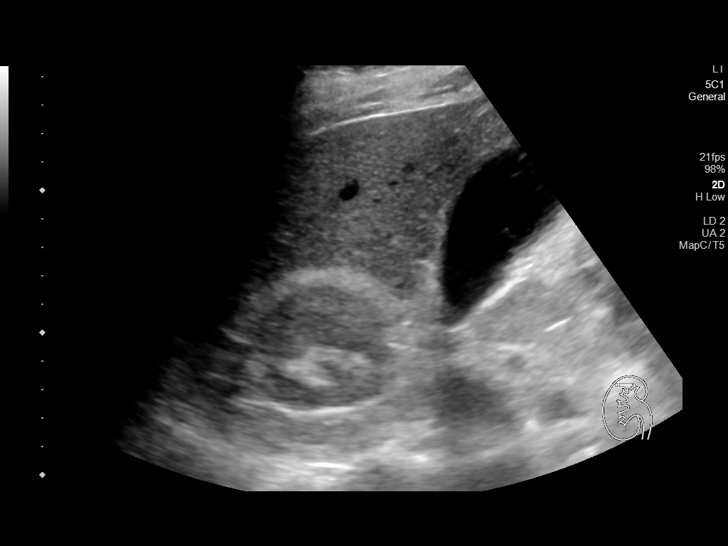
[im 9/27]
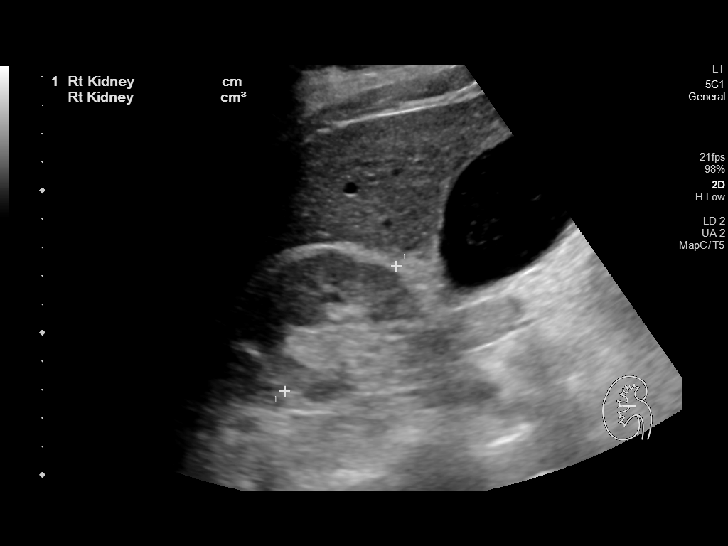
[im 10/27]
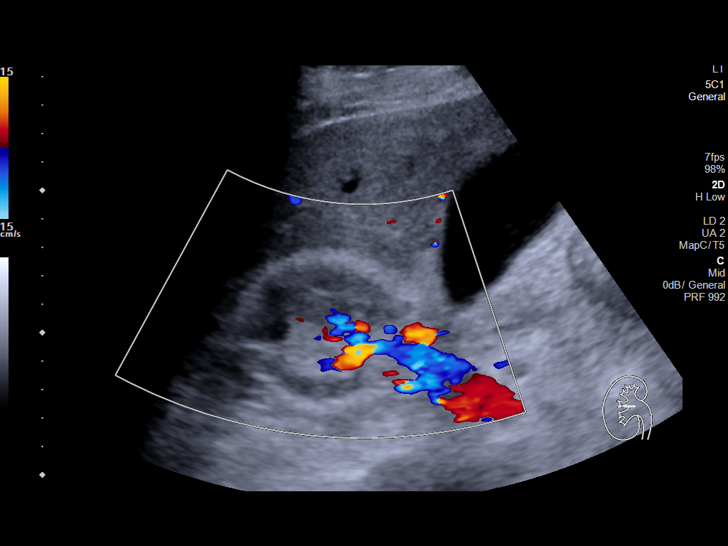
[im 12/27]
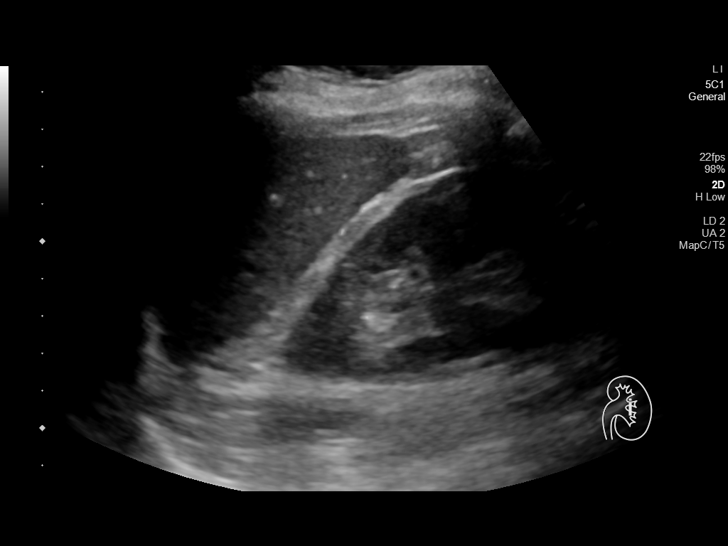
[im 15/27]
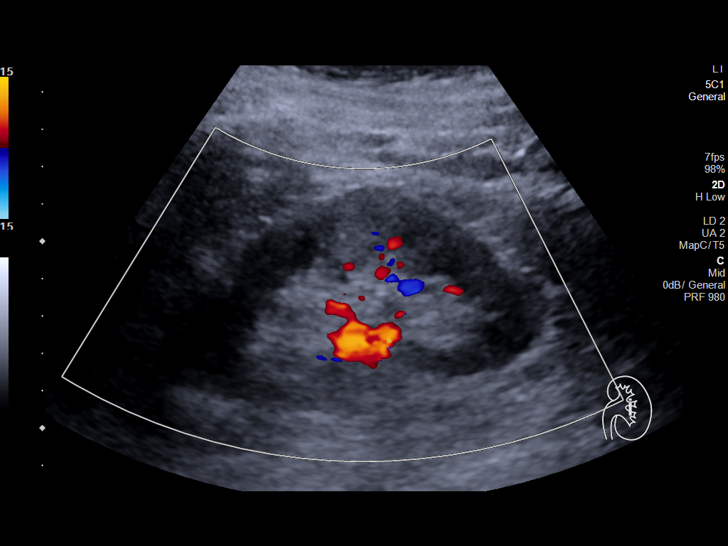
[im 17/27]
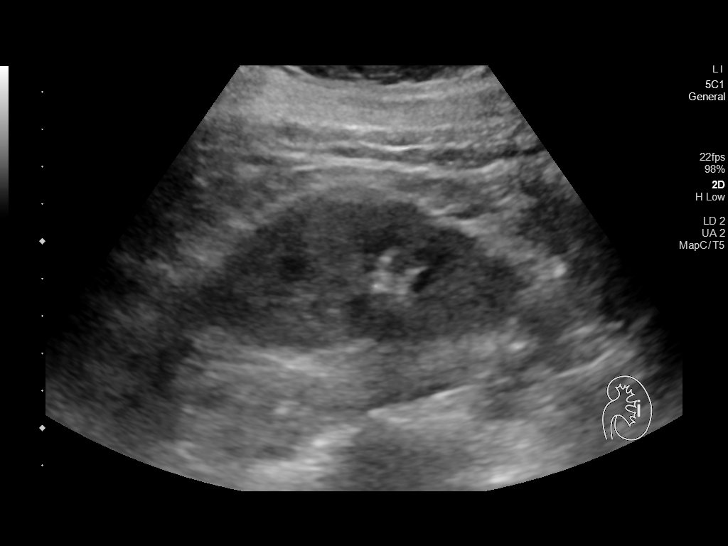
[im 18/27]
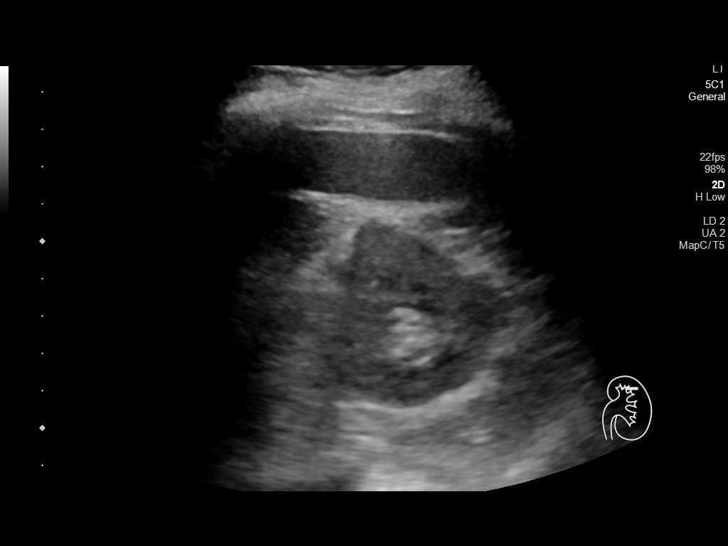
[im 20/27]
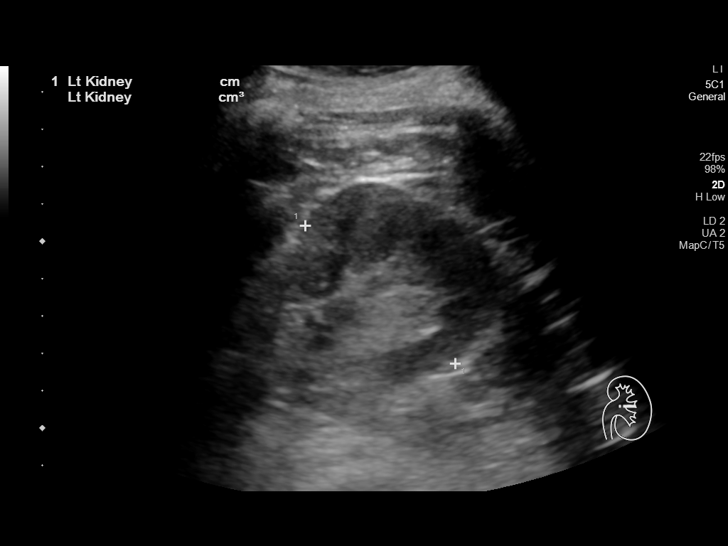
[im 22/27]
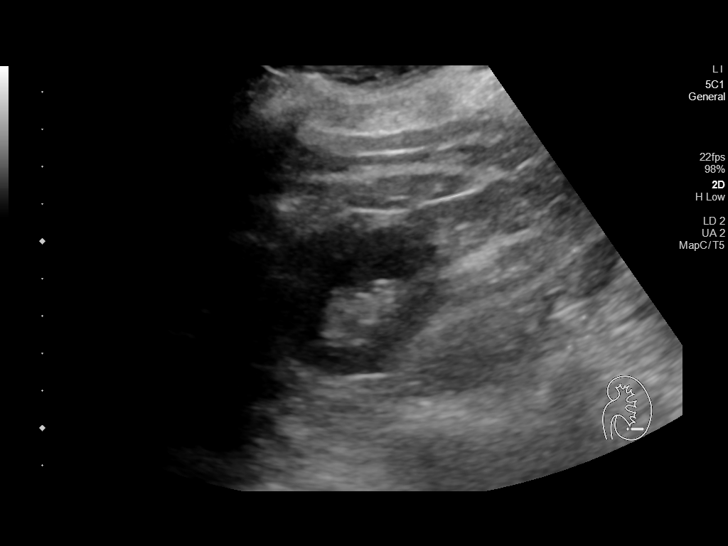
[im 24/27]
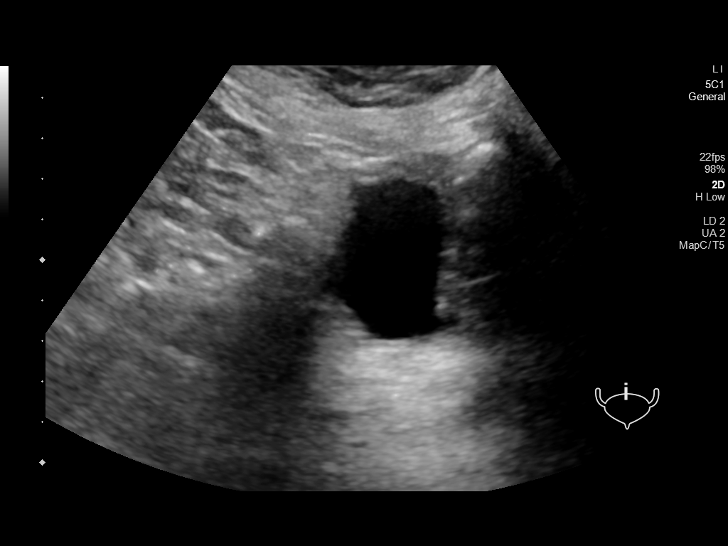
[im 27/27]
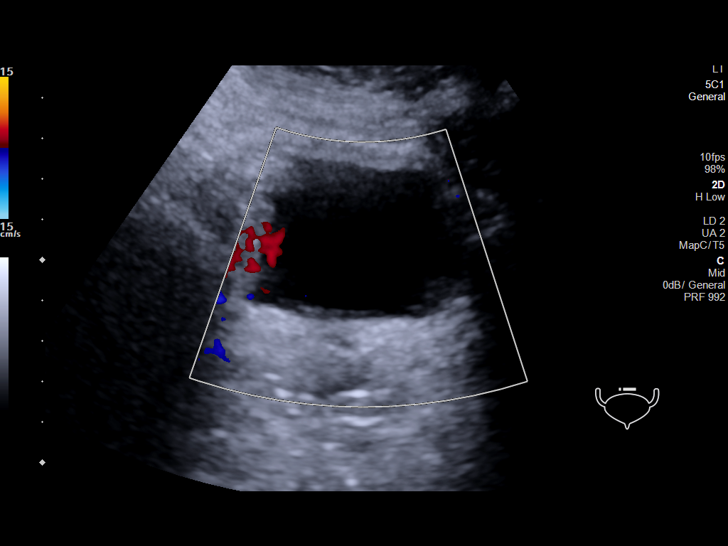

[14 of 25 positions shown; findings below may reference images not displayed]

FINDINGS: Right Kidney:

Renal measurements: 10.4 x 4.9 x 5.9 cm = volume: 158 mL.
Echogenicity within normal limits. No mass or hydronephrosis
visualized.

Left Kidney:

Renal measurements: 10.0 x 5.3 x 5.5 cm = volume: 152 mL.
Echogenicity within normal limits. No mass or hydronephrosis
visualized.

Bladder:

Appears normal for degree of bladder distention.

Other:

None.
IMPRESSION: 1. Normal renal sonogram.

## 2020-08-15 MED ORDER — AMLODIPINE BESYLATE 5 MG PO TABS
2.5000 mg | ORAL_TABLET | Freq: Two times a day (BID) | ORAL | Status: DC
  Administered 2020-08-15 – 2020-08-26 (×22): 2.5 mg via ORAL
  Filled 2020-08-15 (×22): qty 1

## 2020-08-15 MED ORDER — CARVEDILOL 6.25 MG PO TABS
6.2500 mg | ORAL_TABLET | Freq: Two times a day (BID) | ORAL | Status: DC
Start: 2020-08-15 — End: 2020-08-26
  Administered 2020-08-16 – 2020-08-26 (×21): 6.25 mg via ORAL
  Filled 2020-08-15 (×22): qty 1

## 2020-08-15 MED ORDER — FUROSEMIDE 10 MG/ML IJ SOLN
60.0000 mg | Freq: Once | INTRAMUSCULAR | Status: AC
  Administered 2020-08-15: 18:00:00 60 mg via INTRAVENOUS
  Filled 2020-08-15: qty 6

## 2020-08-15 NOTE — Consult Note (Signed)
Renal Service Consult Note [facility]  Christina Nunez 08/15/2020 Sol Blazing, MD Requesting Physician:   Reason for Consult: Renal failure HPI: The patient is a 64 y.o. year-old w/ hx of questionable HTN, dementia, possibly prior CABG presented to ED for gen'd weakness and difficulty ambulating. In ED was confused, mildly. In ED BP was high 218/ 109. Pt was admitted for [facility] urganey and renal failure w/ creat 3.7 on presentation. CXR showed vasc congestion and poss IS edema. Pt rec'd IV lasix in ED and was admitted 12/27. Got more IV lasix on 12/28.  ECHO ordered. UOP yest w/ lasix was 475 cc. Creat today up to 4.40, BN 67. No old labs here. Asked to see for renal failure.    Patient seen in room.  Lived in [place] in [place], moved to [place] to live w/ her sister in October, hasn't had her medications or seen a doctor since arriving in [place].  Has some mild memory problems it appears at first glance.   Endorses nausea and loss of appetite, no vomiting. No jerking or seizures/ LOC episodes.  No SOB or CP, no abd pain.   ROS  denies CP  no joint pain   no HA  no blurry vision  no rash  no diarrhea  no dysuria  no difficulty voiding  no change in urine color    Past Medical History History reviewed. No pertinent past medical history. Past Surgical History History reviewed. No pertinent surgical history. Family History History reviewed. No pertinent family history. Social History  has no history on file for tobacco use, alcohol use, and drug use. Allergies No Known Allergies Home medications Prior to Admission medications   Medication Sig Start Date End Date Taking? Authorizing Provider  amLODipine (NORVASC) 10 MG tablet Take 10 mg by mouth daily.   Yes [provider]  aspirin EC 81 MG tablet Take 81 mg by mouth daily. Swallow whole.   Yes [provider]  clopidogrel (PLAVIX) 75 MG tablet Take 75 mg by mouth daily.   Yes [provider]   FLUoxetine (PROZAC) 40 MG capsule Take 80 mg by mouth daily.   Yes [provider]  furosemide (LASIX) 80 MG tablet Take 80 mg by mouth 2 (two) times daily.   Yes [provider]  Vitamin D, Ergocalciferol, (DRISDOL) 1.25 MG (50000 UNIT) CAPS capsule Take 50,000 Units by mouth every 7 (seven) days.   Yes [provider]     Vitals:   08/15/20 0700 08/15/20 0800 08/15/20 0927 08/15/20 1453  BP: 137/77 (!) 155/76 136/79 119/73  Pulse: 74 75 65 69  Resp: 10 13 12 14   Temp:   98.3 F (36.8 C) 98.7 F (37.1 C)  TempSrc:    Oral  SpO2: 97% 96% 100% 100%  Weight:      Height:       Exam Gen alert, adult AAF up in chair, no distress No rash, cyanosis or gangrene Sclera anicteric, throat clear and moist  No jvd or bruits Chest mild bibasilar crackles, o/w clear bilat RRR no MRG, +sternotomy scar Abd soft ntnd no mass or ascites +bs GU defer MS no joint effusions or deformity Ext no leg or UE edema, no wounds or ulcers Neuro is alert, Ox 2 ("2012", "[place]"), nonfocal, no asterixis    Home meds:  - norvasc 10/ lasix 80 bid  - asa 81/ plavix 75  - prozac 80  - prn's/ vitamins/ supplements  UA 12/27 - 6-10 rbc, 0-5 wbc, protein > 300    Alb 3.2  Ca 8.7  tprot 6.5  eGFR 11   WBC 9K Hb 9.4     Na 137  K 4.7  CO2 18  BUN 67  Cr 4.40    CXR - IMPRESSION: Cardiomegaly with vascular congestion and probable mild interstitial edema.    CT renal stone 12/27 -  IMPRESSION: 1. No urinary tract calculi or obstructive uropathy. 2. Trace right pleural effusion. 3. Cholelithiasis without evidence of acute cholecystitis. 4. Degenerating uterine fibroid. 5. Aortic Atherosclerosis       Urine sediment (undersigned on 12/29) > dip showed 3+ protein, no LE/ nitrite/ blood. Microscopic showed scattered granular and hyaline casts, rare RBC/ WBC < 2 per HPF   Assessment/ Plan: 1. Renal failure - no old labs here or in chart. Creat 3.8 on admit and up to 4.40 today.   Uncont HTN on admit, out of meds for sometime. CT shows good sized kidneys, no atrophy / scarring, no obstruction. The fact that she was on lasix 80 bid suggests advanced heart or renal disease. Sediment not c/w GN. May have some ATN from uncont HTN. Will get renal US, urine lytes and protein quantification. Give one more dose today of IV lasix. Lower BP meds, let BP come up somewhat.  +Nausea, poor appetite, suspect mild uremia, will recommend transfer to [facility] in case needs dialysis soon, eGFR is down at 11 ml/min. Will follow.  2. HTN uncontrolled - BP 220/111 on presentation, now 119/73 on 2 BP meds. Lowered dosing as above.  3. 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 Anemia - Hb 9.4, normocytic. Could be CKD related.       Kelly Splinter  MD 08/15/2020, 3:09 PM  Recent Labs  Lab 08/14/20 0719 08/15/20 0239  WBC 7.0 9.1  HGB 9.4* 9.4*   Recent Labs  Lab 08/14/20 0719 08/15/20 0239  K 4.7 4.7  BUN 62* 67*  CREATININE 3.85* 4.40*  CALCIUM 9.2 8.7*

## 2020-08-15 NOTE — TOC Progression Note (Signed)
Transition of Care [facility]) - Progression Note    Patient Details  Name: Christina Nunez MRN: [id number] Date of Birth: 1956/08/15  [facility] CM/SW Contact  Jakaya Jacobowitz, Juliann Pulse, RN Phone Number: 08/15/2020, 10:42 AM  Clinical Narrative: Will provide bed offers for SNF,then await choice.            Expected Discharge Plan and Services                                                 Social Determinants of Health (SDOH) Interventions    Readmission Risk Interventions No flowsheet data found.

## 2020-08-15 NOTE — Progress Notes (Signed)
New Admission Note:  Arrival Method: [facility] Mental Orientation: Alert and oriented x 3-4  Telemetry: Box 15 NSR Assessment: Completed Skin: Warm and dry IV: NSL Pain: Denies  Tubes: N/A Safety Measures: Safety Fall Prevention Plan initiated.  Admission: Completed 5 M  Orientation: Patient has been orientated to the room, unit and the staff. Welcome booklet given.  Family: None  Orders have been reviewed and implemented. Will continue to monitor the patient. Call light has been placed within reach and bed alarm has been activated.   Sima Matas BSN, RN  Phone Number: [phone]

## 2020-08-15 NOTE — NC FL2 (Signed)
[place] LEVEL OF CARE SCREENING TOOL     IDENTIFICATION  Patient Name: Christina Nunez Birthdate: 1955-11-06 Sex: female Admission Date (Current Location): 08/13/2020  [facility] [place] [facility] and [place]:  Herbalist and Address:  [facility],  [place] [address] [place]      Provider Number: [id number]  Attending Physician Name and Address:  Nita Sells, MD  Relative Name and Phone Number:       Current Level of Care: Hospital Recommended Level of Care: [place] Prior Approval Number:    Date Approved/Denied:   PASRR Number: [id number]  Discharge Plan: SNF    Current Diagnoses: Patient Active Problem List   Diagnosis Date Noted  . Memory deficit 08/14/2020  . AKI (acute kidney injury) ([place]) 08/14/2020  . History of ischemic heart disease 08/14/2020  . Depression 08/14/2020  . Hypertensive urgency 08/14/2020  . Essential hypertension 08/13/2020  . Hypertensive emergency 08/13/2020    Orientation RESPIRATION BLADDER Height & Weight     Self,Time,Situation,Place  Normal Continent Weight: 81.6 kg Height:  5\' 3"  (160 cm)  BEHAVIORAL SYMPTOMS/MOOD NEUROLOGICAL BOWEL NUTRITION STATUS      Continent Diet (regular)  AMBULATORY STATUS COMMUNICATION OF NEEDS Skin   Extensive Assist   Normal                       Personal Care Assistance Level of Assistance  Bathing,Feeding,Dressing Bathing Assistance: Limited assistance Feeding assistance: Limited assistance Dressing Assistance: Limited assistance     Functional Limitations Info  Sight,Hearing,Speech Sight Info: Adequate Hearing Info: Adequate Speech Info: Adequate    SPECIAL CARE FACTORS FREQUENCY  PT (By licensed PT),OT (By licensed OT)     PT Frequency: 5 x weekly OT Frequency: 5 x weekly            Contractures Contractures Info: Not present    Additional Factors Info  Code Status Code Status Info: FULL              Current Medications (08/15/2020):  This is the current hospital active medication list Current Facility-Administered Medications  Medication Dose Route Frequency Provider Last Rate Last Admin  . acetaminophen (TYLENOL) tablet 325 mg  325 mg Oral Q6H PRN Cox, Amy N, DO   325 mg at 08/14/20 1428   Or  . acetaminophen (TYLENOL) suppository 325 mg  325 mg Rectal Q6H PRN Cox, Amy N, DO      . amLODipine (NORVASC) tablet 10 mg  10 mg Oral Daily Cox, Amy N, DO   10 mg at 08/14/20 0803  . aspirin EC tablet 81 mg  81 mg Oral Daily Starla Link, Kshitiz, MD   81 mg at 08/14/20 1423  . atorvastatin (LIPITOR) tablet 40 mg  40 mg Oral Daily Cox, Amy N, DO   40 mg at 08/14/20 0802  . carvedilol (COREG) tablet 12.5 mg  12.5 mg Oral BID WC Cox, Amy N, DO   12.5 mg at 08/14/20 1640  . Chlorhexidine Gluconate Cloth 2 % PADS 6 each  6 each Topical Daily Cox, Amy N, DO   6 each at 08/15/20 0205  . clopidogrel (PLAVIX) tablet 75 mg  75 mg Oral Daily Aline August, MD   75 mg at 08/14/20 1423  . FLUoxetine (PROZAC) capsule 80 mg  80 mg Oral Daily Cox, Amy N, DO   80 mg at 08/14/20 0802  . heparin injection 5,000 Units  5,000 Units Subcutaneous Q8H Cox, Amy N,  DO   5,000 Units at 08/14/20 2122  . influenza vac split quadrivalent PF (FLUARIX) injection 0.5 mL  0.5 mL Intramuscular Tomorrow-1000 Tyrone Nine, Dan, DO      . melatonin tablet 3 mg  3 mg Oral QHS Cox, Amy N, DO   3 mg at 08/14/20 2122  . ondansetron (ZOFRAN) tablet 4 mg  4 mg Oral Q6H PRN Cox, Amy N, DO       Or  . ondansetron (ZOFRAN) injection 4 mg  4 mg Intravenous Q6H PRN Cox, Amy N, DO      . oxyCODONE (Oxy IR/ROXICODONE) immediate release tablet 5 mg  5 mg Oral Q6H PRN Aline August, MD   5 mg at 08/14/20 1640  . pantoprazole (PROTONIX) EC tablet 40 mg  40 mg Oral Daily Cox, Amy N, DO   40 mg at 08/14/20 0803     Discharge Medications: Please see discharge summary for a list of discharge medications.  Relevant Imaging Results:  Relevant Lab  Results:   Additional Information ATF:[id number]  Leeroy Cha, RN

## 2020-08-15 NOTE — Progress Notes (Signed)
PROGRESS NOTE   Romesha Scherer  YOV:[id number] DOB: 08-14-1956 DOA: 08/13/2020 PCP: Medicine, [facility]  Brief Narrative:  64 year old female History of high blood pressure, chronic kidney disease baseline creatinine 2.5?  CABG Recently moved from [place] to live in [place] living with sister since October unclear if taking medications  Came to emergency room flank pain urinary retention very hypertensive 218/109 bladder scan to 37 cc and white count normal hemoglobin 10.5 BUNs/creatinine 65/3.7  Admitted for hypertensive urgency  Assessment & Plan:   Principal Problem:   Hypertensive emergency Active Problems:   Essential hypertension   Memory deficit   AKI (acute kidney injury) ([place])   History of ischemic heart disease   Depression   Hypertensive urgency   1. Hypertensive emergency  a. Currently now resolved-continue Coreg 12.5 twice daily and amlodipine 10 b. Will need outpatient gradual titration and addition of automatically add hydralazine tomorrow depending on further trends 2. elevated troponin a. No current chest pain  and troponin leveled out at 21 without any further rise b. EKG from admission reviewed and no specific AMI findings c. No wall motion abnormalities on echo 3. AKI superimposed on CKD 3, baseline 2.5 a. Either progression versus secondary to IV Lasix use b. Lasix discontinued c. Nursing scanned the bladder 0 retention d. Follow FE urea and determine next steps will hold Lasix at this time does not appear volume overloaded e. nephology has seen the patient and recommends transfer to [facility] in case HD initiation is required 4. History ischemic heart disease with CABG 5. HFpEF 55 to 60% this admission a. Continue Plavix 75 b. Other meds as above 6. Left lower extremity pain a. Ultrasound Dopplers 12/28 are negative 7. Reflux 8. Mild thrombocytopenia 9. Depression/schizophrenia 10. ?  Dementia  DVT prophylaxis: Lovenox Code  Status: Full Family Communication: called sister Ms Dara Lords no response-called son and updated No clear HCPOA Disposition:  Status is: Inpatient  Remains inpatient appropriate because:Hemodynamically unstable, Ongoing active pain requiring inpatient pain management, Altered mental status, Ongoing diagnostic testing needed not appropriate for outpatient work up and Unsafe d/c plan   Dispo: The patient is from: Home              Anticipated d/c is to: SNF              Anticipated d/c date is: 2 days              Patient currently is not medically stable to d/c.       Consultants:   None currently  Procedures:   Echocardiogram 12/28 IMPRESSIONS    1. Left ventricular ejection fraction, by estimation, is 50 to 55%. The  left ventricle has low normal function. The left ventricle demonstrates  regional wall motion abnormalities (see scoring diagram/findings for  description). There is mild concentric  left ventricular hypertrophy. Left ventricular diastolic parameters are  consistent with Grade II diastolic dysfunction (pseudonormalization).  Elevated left atrial pressure. There is mild hypokinesis of the left  ventricular, basal-mid inferior wall and  inferoseptal wall.  2. Right ventricular systolic function is normal. The right ventricular  size is normal. There is normal pulmonary artery systolic pressure.  3. Left atrial size was mildly dilated.  4. The mitral valve is normal in structure. Moderate mitral valve  regurgitation.  5. The aortic valve is tricuspid. Aortic valve regurgitation is not  visualized. No aortic stenosis is present.  6. The inferior vena cava is normal in size with  greater than 50%  respiratory variability, suggesting right atrial pressure of 3 mmHg.   Antimicrobials: None   Subjective: Awake oriented Slightly odd affect-has some slight sighing motions and deep breaths at times and states that the tick that she has had since her heart  attack in [place] several years ago Tells me she takes her meds on her own but does not recall some of her diagnoses when I asked her about memory loss she tells me she has been losing some memory since October No chest pain no fever  Objective: Vitals:   08/15/20 0400 08/15/20 0600 08/15/20 0700 08/15/20 0800  BP: 137/78 (!) 146/75 137/77 (!) 155/76  Pulse: 72 75 74 75  Resp: 11 11 10 13   Temp: 98.2 F (36.8 C)     TempSrc: Oral     SpO2: 97% 97% 97% 96%  Weight:      Height:        Intake/Output Summary (Last 24 hours) at 08/15/2020 0846 Last data filed at 08/15/2020 7893 Gross per 24 hour  Intake 120 ml  Output 350 ml  Net -230 ml   Filed Weights   08/13/20 1508  Weight: 81.6 kg    Examination: EOMI NCAT no focal deficit intermittent tics with deep sighing respirations CTA B no added sound no rales no rhonchi S1-S2 no murmur no rub no gallop Abdomen soft no rebound No lower extremity edema Power 5/5 moving all 4 limbs equally   Data Reviewed: I have personally reviewed following labs and imaging studies Sodium 137 potassium 4.7 CO2 18 BUN/creatinine 65/3.7-->67/4.4 Hemoglobin 9.4 platelet 139  Radiology Studies: CT Head Wo Contrast  Result Date: 08/13/2020 CLINICAL DATA:  Encephalopathy EXAM: CT HEAD WITHOUT CONTRAST TECHNIQUE: Contiguous axial images were obtained from the base of the skull through the vertex without intravenous contrast. COMPARISON:  None. FINDINGS: Brain: There is no mass, hemorrhage or extra-axial collection. The size and configuration of the ventricles and extra-axial CSF spaces are normal. There is hypoattenuation of the white matter, most commonly indicating chronic small vessel disease. Vascular: No abnormal hyperdensity of the major intracranial arteries or dural venous sinuses. No intracranial atherosclerosis. Skull: The visualized skull base, calvarium and extracranial soft tissues are normal. Sinuses/Orbits: No fluid levels or advanced  mucosal thickening of the visualized paranasal sinuses. No mastoid or middle ear effusion. The orbits are normal. IMPRESSION: Chronic small vessel disease without acute intracranial abnormality. Electronically Signed   By: Ulyses Jarred M.D.   On: 08/13/2020 23:24   DG Chest Portable 1 View  Result Date: 08/13/2020 CLINICAL DATA:  Abdomen pain hypertension EXAM: PORTABLE CHEST 1 VIEW COMPARISON:  None. FINDINGS: Post sternotomy changes. Cardiomegaly with vascular congestion and mild diffuse interstitial opacity, likely edema. No pneumothorax. IMPRESSION: Cardiomegaly with vascular congestion and probable mild interstitial edema. Electronically Signed   By: Donavan Foil M.D.   On: 08/13/2020 21:32   ECHOCARDIOGRAM COMPLETE  Result Date: 08/14/2020    ECHOCARDIOGRAM REPORT   Patient Name:   MONICKA CYRAN Date of Exam: 08/14/2020 Medical Rec #:  [id number]    Height:       63.0 in Accession #:    [id number]   Weight:       180.0 lb Date of Birth:  1956-06-07    BSA:          1.849 m Patient Age:    18 years     BP:           154/87 mmHg Patient Gender:  F            HR:           72 bpm. Exam Location:  Inpatient Procedure: 2D Echo Indications:    Congestive Heart Failure  History:        Patient has no prior history of Echocardiogram examinations.  Sonographer:    Mikki Santee RDCS (AE) Referring Phys: [id number] AMY N [facility] IMPRESSIONS  1. Left ventricular ejection fraction, by estimation, is 50 to 55%. The left ventricle has low normal function. The left ventricle demonstrates regional wall motion abnormalities (see scoring diagram/findings for description). There is mild concentric left ventricular hypertrophy. Left ventricular diastolic parameters are consistent with Grade II diastolic dysfunction (pseudonormalization). Elevated left atrial pressure. There is mild hypokinesis of the left ventricular, basal-mid inferior wall and inferoseptal wall.  2. Right ventricular systolic function is normal. The right  ventricular size is normal. There is normal pulmonary artery systolic pressure.  3. Left atrial size was mildly dilated.  4. The mitral valve is normal in structure. Moderate mitral valve regurgitation.  5. The aortic valve is tricuspid. Aortic valve regurgitation is not visualized. No aortic stenosis is present.  6. The inferior vena cava is normal in size with greater than 50% respiratory variability, suggesting right atrial pressure of 3 mmHg. Comparison(s): No prior Echocardiogram. FINDINGS  Left Ventricle: Left ventricular ejection fraction, by estimation, is 50 to 55%. The left ventricle has low normal function. The left ventricle demonstrates regional wall motion abnormalities. Mild hypokinesis of the left ventricular, basal-mid inferior  wall and inferoseptal wall. The left ventricular internal cavity size was normal in size. There is mild concentric left ventricular hypertrophy. Abnormal (paradoxical) septal motion consistent with post-operative status. Left ventricular diastolic parameters are consistent with Grade II diastolic dysfunction (pseudonormalization). Elevated left atrial pressure. Right Ventricle: The right ventricular size is normal. No increase in right ventricular wall thickness. Right ventricular systolic function is normal. There is normal pulmonary artery systolic pressure. The tricuspid regurgitant velocity is 1.88 m/s, and  with an assumed right atrial pressure of 3 mmHg, the estimated right ventricular systolic pressure is 13.2 mmHg. Left Atrium: Left atrial size was mildly dilated. Right Atrium: Right atrial size was normal in size. Pericardium: There is no evidence of pericardial effusion. Mitral Valve: The mitral valve is normal in structure. Moderate mitral valve regurgitation, with centrally-directed jet. Tricuspid Valve: The tricuspid valve is normal in structure. Tricuspid valve regurgitation is trivial. Aortic Valve: The aortic valve is tricuspid. Aortic valve regurgitation is  not visualized. No aortic stenosis is present. Pulmonic Valve: The pulmonic valve was normal in structure. Pulmonic valve regurgitation is not visualized. Aorta: The aortic root and ascending aorta are structurally normal, with no evidence of dilitation. Venous: The inferior vena cava is normal in size with greater than 50% respiratory variability, suggesting right atrial pressure of 3 mmHg. IAS/Shunts: No atrial level shunt detected by color flow Doppler.  LEFT VENTRICLE PLAX 2D LVIDd:         4.30 cm  Diastology LVIDs:         3.20 cm  LV e' medial:    5.03 cm/s LV PW:         1.30 cm  LV E/e' medial:  20.3 LV IVS:        1.20 cm  LV e' lateral:   4.54 cm/s LVOT diam:     2.20 cm  LV E/e' lateral: 22.5 LV SV:  62 LV SV Index:   34 LVOT Area:     3.80 cm  LEFT ATRIUM             Index       RIGHT ATRIUM           Index LA diam:        3.70 cm 2.00 cm/m  RA Area:     11.40 cm LA Vol (A2C):   72.4 ml 39.16 ml/m RA Volume:   19.80 ml  10.71 ml/m LA Vol (A4C):   59.4 ml 32.13 ml/m LA Biplane Vol: 67.2 ml 36.34 ml/m  AORTIC VALVE LVOT Vmax:   69.80 cm/s LVOT Vmean:  47.800 cm/s LVOT VTI:    0.163 m  AORTA Ao Root diam: 3.00 cm MITRAL VALVE                 TRICUSPID VALVE MV Area (PHT): 4.15 cm      TR Peak grad:   14.1 mmHg MV Decel Time: 183 msec      TR Vmax:        188.00 cm/s MR Peak grad:    96.0 mmHg MR Mean grad:    63.0 mmHg   SHUNTS MR Vmax:         490.00 cm/s Systemic VTI:  0.16 m MR Vmean:        380.0 cm/s  Systemic Diam: 2.20 cm MR PISA:         2.26 cm MR PISA Eff ROA: 18 mm MR PISA Radius:  0.60 cm MV E velocity: 102.00 cm/s MV A velocity: 80.60 cm/s MV E/A ratio:  1.27 Mihai Croitoru MD Electronically signed by Sanda Klein MD Signature Date/Time: 08/14/2020/11:49:55 AM    Final    CT Renal Stone Study  Result Date: 08/13/2020 CLINICAL DATA:  Flank pain, urinary retention EXAM: CT ABDOMEN AND PELVIS WITHOUT CONTRAST TECHNIQUE: Multidetector CT imaging of the abdomen and pelvis  was performed following the standard protocol without IV contrast. COMPARISON:  None. FINDINGS: Lower chest: There is a trace right pleural effusion. Patchy areas of consolidation at the lung bases likely reflect atelectasis. No pericardial effusion. Hepatobiliary: Small gallstones are seen layering dependently in the gallbladder. No evidence of acute cholecystitis. Unenhanced imaging of the liver is unremarkable. No biliary dilation. Pancreas: Unremarkable. No pancreatic ductal dilatation or surrounding inflammatory changes. Spleen: Normal in size without focal abnormality. Adrenals/Urinary Tract: No urinary tract calculi or obstructive uropathy. Bladder is unremarkable. The adrenals are normal. Stomach/Bowel: No bowel obstruction or ileus. Normal appendix right lower quadrant. No bowel wall thickening or inflammatory change. Vascular/Lymphatic: Aortic atherosclerosis. No enlarged abdominal or pelvic lymph nodes. Reproductive: Degenerating uterine fibroid right fundal aspect measuring up to 3.6 cm. Otherwise the uterus and adnexal structures are unremarkable. Other: No free fluid or free gas. No abdominal wall hernia. Musculoskeletal: No acute or destructive bony lesions. Reconstructed images demonstrate no additional findings. IMPRESSION: 1. No urinary tract calculi or obstructive uropathy. 2. Trace right pleural effusion. 3. Cholelithiasis without evidence of acute cholecystitis. 4. Degenerating uterine fibroid. 5. Aortic Atherosclerosis (ICD10-I70.0). Electronically Signed   By: Randa Ngo M.D.   On: 08/13/2020 17:47   VAS [place] LOWER EXTREMITY VENOUS (DVT)  Result Date: 08/14/2020  Lower Venous DVT Study Indications: Edema.  Risk Factors: None identified. Comparison Study: No prior studies. Performing Technologist: Oliver Hum RVT  Examination Guidelines: A complete evaluation includes B-mode imaging, spectral Doppler, color Doppler, and power Doppler as needed of all accessible portions  of each  vessel. Bilateral testing is considered an integral part of a complete examination. Limited examinations for reoccurring indications may be performed as noted. The reflux portion of the exam is performed with the patient in reverse Trendelenburg.  +---------+---------------+---------+-----------+----------+--------------+ RIGHT    CompressibilityPhasicitySpontaneityPropertiesThrombus Aging +---------+---------------+---------+-----------+----------+--------------+ CFV      Full           Yes      Yes                                 +---------+---------------+---------+-----------+----------+--------------+ SFJ      Full                                                        +---------+---------------+---------+-----------+----------+--------------+ FV Prox  Full                                                        +---------+---------------+---------+-----------+----------+--------------+ FV Mid   Full                                                        +---------+---------------+---------+-----------+----------+--------------+ FV DistalFull                                                        +---------+---------------+---------+-----------+----------+--------------+ PFV      Full                                                        +---------+---------------+---------+-----------+----------+--------------+ POP      Full           Yes      Yes                                 +---------+---------------+---------+-----------+----------+--------------+ PTV      Full                                                        +---------+---------------+---------+-----------+----------+--------------+ PERO     Full                                                        +---------+---------------+---------+-----------+----------+--------------+   +---------+---------------+---------+-----------+----------+--------------+ LEFT  CompressibilityPhasicitySpontaneityPropertiesThrombus Aging +---------+---------------+---------+-----------+----------+--------------+ CFV      Full           Yes      Yes                                 +---------+---------------+---------+-----------+----------+--------------+ SFJ      Full                                                        +---------+---------------+---------+-----------+----------+--------------+ FV Prox  Full                                                        +---------+---------------+---------+-----------+----------+--------------+ FV Mid   Full                                                        +---------+---------------+---------+-----------+----------+--------------+ FV DistalFull                                                        +---------+---------------+---------+-----------+----------+--------------+ PFV      Full                                                        +---------+---------------+---------+-----------+----------+--------------+ POP      Full           Yes      Yes                                 +---------+---------------+---------+-----------+----------+--------------+ PTV      Full                                                        +---------+---------------+---------+-----------+----------+--------------+ PERO     Full                                                        +---------+---------------+---------+-----------+----------+--------------+     Summary: RIGHT: - There is no evidence of deep vein thrombosis in the lower extremity.  - No cystic structure found in the popliteal fossa.  LEFT: - There is no evidence of deep vein thrombosis in the lower extremity.  - No cystic structure found in the popliteal  fossa.  *See table(s) above for measurements and observations. Electronically signed by Servando Snare MD on 08/14/2020 at 2:12:38 PM.    Final      Scheduled Meds: .  amLODipine  10 mg Oral Daily  . aspirin EC  81 mg Oral Daily  . atorvastatin  40 mg Oral Daily  . carvedilol  12.5 mg Oral BID WC  . Chlorhexidine Gluconate Cloth  6 each Topical Daily  . clopidogrel  75 mg Oral Daily  . FLUoxetine  80 mg Oral Daily  . heparin injection (subcutaneous)  5,000 Units Subcutaneous Q8H  . influenza vac split quadrivalent PF  0.5 mL Intramuscular Tomorrow-1000  . melatonin  3 mg Oral QHS  . pantoprazole  40 mg Oral Daily   Continuous Infusions:   LOS: 1 day    Time spent: [place], MD [facility] To contact the attending provider between 7A-7P or the covering provider during after hours 7P-7A, please log into the web site [email or web address] and access using universal [place] password for that web site. If you do not have the password, please call the hospital operator.  08/15/2020, 8:46 AM

## 2020-08-15 NOTE — TOC Initial Note (Addendum)
Transition of Care [facility]) - Initial/Assessment Note    Patient Details  Name: Christina Nunez MRN: [id number] Date of Birth: 05/05/1956  [facility]) CM/SW Contact:    Christina Phi, RN Phone Number: 08/15/2020, 12:33 PM  Clinical Narrative: Patient agree to SNF-chose [facility]-facility will start auth. Patient agree to covid vaccine in hospital has no preference-MD/Nsg notified. Will need covid results 24-48hrs prior d/c. 2:13p-patient/sister Christina Nunez decided to [facility] aware.                  Expected Discharge Plan: Skilled Nursing Facility Barriers to Discharge: Insurance Authorization   Patient Goals and CMS Choice Patient states their goals for this hospitalization and ongoing recovery are:: go to rehab CMS Medicare.gov Compare Post Acute Care list provided to:: Patient Choice offered to / list presented to : Patient  Expected Discharge Plan and Services Expected Discharge Plan: [place]   Discharge Planning Services: CM Consult   Living arrangements for the past 2 months: Single Family Home                                      Prior Living Arrangements/Services Living arrangements for the past 2 months: Single Family Home Lives with:: Self Patient language and need for interpreter reviewed:: Yes Do you feel safe going back to the place where you live?: Yes      Need for Family Participation in Patient Care: No (Comment) Care giver support system in place?: Yes (comment)   Criminal Activity/Legal Involvement Pertinent to Current Situation/Hospitalization: No - Comment as needed  Activities of Daily Living Home Assistive Devices/Equipment: Eyeglasses ADL Screening (condition at time of admission) Patient's cognitive ability adequate to safely complete daily activities?: No Is the patient deaf or have difficulty hearing?: No Does the patient have difficulty seeing, even when wearing  glasses/contacts?: Yes Does the patient have difficulty concentrating, remembering, or making decisions?: Yes Patient able to express need for assistance with ADLs?: Yes Does the patient have difficulty dressing or bathing?: Yes Independently performs ADLs?: No Communication: Independent Dressing (OT): Needs assistance Is this a change from baseline?: Pre-admission baseline Grooming: Needs assistance Is this a change from baseline?: Pre-admission baseline Feeding: Independent Bathing: Needs assistance Is this a change from baseline?: Pre-admission baseline Toileting: Needs assistance (wears depends) Is this a change from baseline?: Pre-admission baseline In/Out Bed: Needs assistance Is this a change from baseline?: Pre-admission baseline Walks in Home: Needs assistance (loses her balance) Is this a change from baseline?: Pre-admission baseline Does the patient have difficulty walking or climbing stairs?: Yes Weakness of Legs: Both Weakness of Arms/Hands: Both  Permission Sought/Granted Permission sought to share information with : Case Manager Permission granted to share information with : Yes, Verbal Permission Granted  Share Information with NAME: Case Manager     Permission granted to share info w Relationship: Christina Nunez sister [phone]     Emotional Assessment Appearance:: Appears stated age Attitude/Demeanor/Rapport: Gracious Affect (typically observed): Accepting Orientation: : Oriented to Self,Oriented to Place,Oriented to  Time,Oriented to Situation Alcohol / Substance Use: Not Applicable Psych Involvement: No (comment)  Admission diagnosis:  Hypertensive emergency [I16.1] Acute kidney injury superimposed on chronic kidney disease ([place]) [N17.9, N18.9] Hypertensive urgency [I16.0] Patient Active Problem List   Diagnosis Date Noted  . Memory deficit 08/14/2020  . AKI (acute kidney injury) ([place]) 08/14/2020  . History of ischemic heart disease  08/14/2020  .  Depression 08/14/2020  . Hypertensive urgency 08/14/2020  . Essential hypertension 08/13/2020  . Hypertensive emergency 08/13/2020   PCP:  Medicine, [facility]:   [address] [place], [place] [address] [place] [address] Phone: [phone] Fax: [phone]     Social Determinants of Health (SDOH) Interventions    Readmission Risk Interventions No flowsheet data found.

## 2020-08-15 NOTE — TOC Progression Note (Signed)
Transition of Care [facility]) - Progression Note    Patient Details  Name: Ger Nicks MRN: [id number] Date of Birth: 01-15-1956  [facility]) CM/SW Contact  Leeroy Cha, RN Phone Number: 08/15/2020, 7:33 AM  Clinical Narrative:    fl2 sent out to area snf and extended area Medicaid policy.        Expected Discharge Plan and Services                                                 Social Determinants of Health (SDOH) Interventions    Readmission Risk Interventions No flowsheet data found.

## 2020-08-16 ENCOUNTER — Inpatient Hospital Stay (HOSPITAL_COMMUNITY): Payer: Medicaid Other

## 2020-08-16 DIAGNOSIS — I161 Hypertensive emergency: Secondary | ICD-10-CM | POA: Diagnosis not present

## 2020-08-16 DIAGNOSIS — I10 Essential (primary) hypertension: Secondary | ICD-10-CM | POA: Diagnosis not present

## 2020-08-16 DIAGNOSIS — I639 Cerebral infarction, unspecified: Secondary | ICD-10-CM | POA: Diagnosis not present

## 2020-08-16 LAB — BASIC METABOLIC PANEL
Anion gap: 12 (ref 5–15)
BUN: 67 mg/dL — ABNORMAL HIGH (ref 8–23)
CO2: 16 mmol/L — ABNORMAL LOW (ref 22–32)
Calcium: 8.4 mg/dL — ABNORMAL LOW (ref 8.9–10.3)
Chloride: 106 mmol/L (ref 98–111)
Creatinine, Ser: 4.78 mg/dL — ABNORMAL HIGH (ref 0.44–1.00)
GFR, Estimated: 10 mL/min — ABNORMAL LOW (ref >60)
Glucose, Bld: 121 mg/dL — ABNORMAL HIGH (ref 70–99)
Potassium: 4.7 mmol/L (ref 3.5–5.1)
Sodium: 134 mmol/L — ABNORMAL LOW (ref 135–145)

## 2020-08-16 LAB — VITAMIN D 25 HYDROXY (VIT D DEFICIENCY, FRACTURES): Vit D, 25-Hydroxy: 11 ng/mL — ABNORMAL LOW (ref 30–100)

## 2020-08-16 LAB — UREA NITROGEN, URINE: Urea Nitrogen, Ur: 456 mg/dL

## 2020-08-16 IMAGING — MR MR HEAD W/O CM
12 of 13 series · 44 of 48 positions shown · non-contrast
Comparison: Head CT [DATE].

CLINICAL DATA: Delirium.

EXAM:
MRI HEAD WITHOUT CONTRAST
TECHNIQUE: Multiplanar, multiecho pulse sequences of the brain and surrounding
structures were obtained without intravenous contrast.

[Series 5: DWI · axial · 3.0mm · 0.88mm/px · z∈[-88,+62]mm · 8 of 104 slices shown (1 of 4)]
[im 1/104]
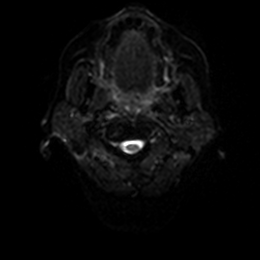
[im 15/104]
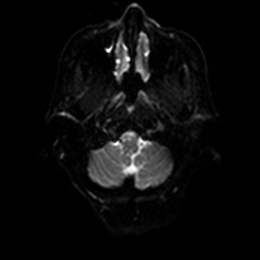
[im 30/104]
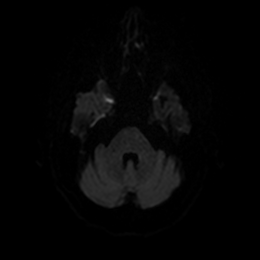
[im 45/104]
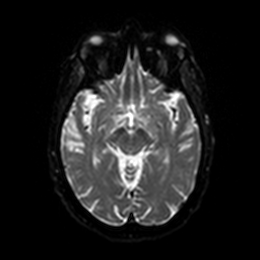
[im 59/104]
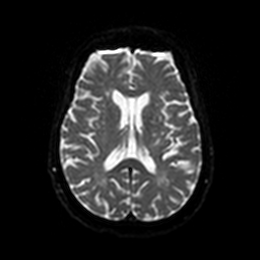
[im 74/104]
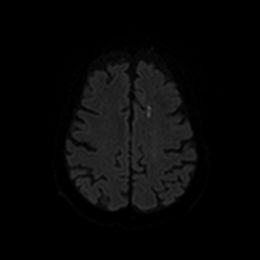
[im 89/104]
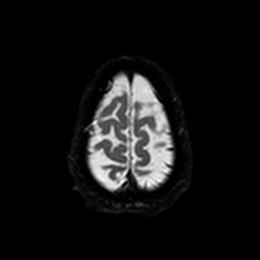
[im 104/104]
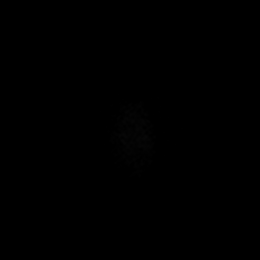

[Series 6: DWI · axial · 3.0mm · 0.88mm/px · z∈[-88,+62]mm · 4 of 52 slices shown (2 of 4)]
[im 1/52]
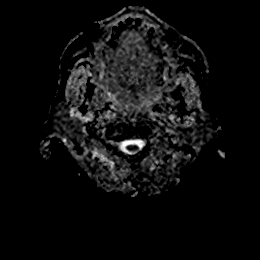
[im 18/52]
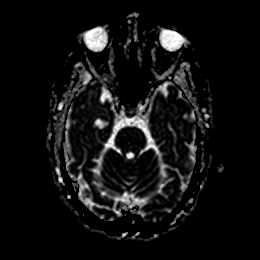
[im 35/52]
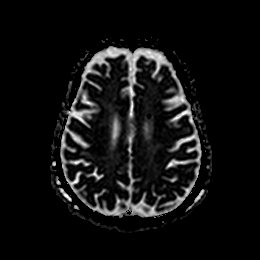
[im 52/52]
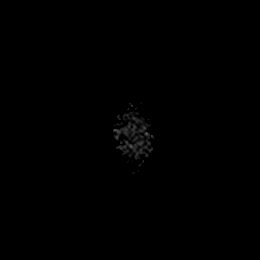

[Series 7: DWI · coronal · 4.0mm · 0.88mm/px · 5 of 72 slices shown (3 of 4)]
[im 1/72]
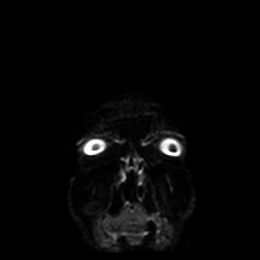
[im 18/72]
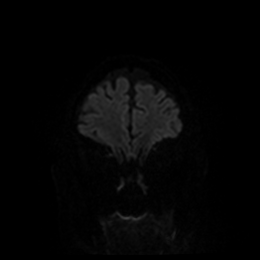
[im 36/72]
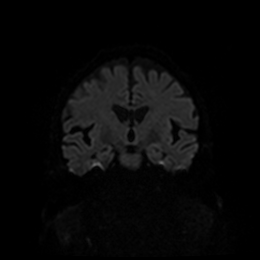
[im 54/72]
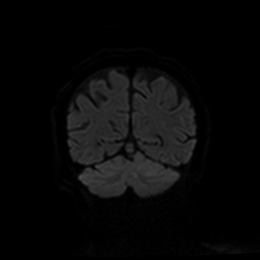
[im 72/72]
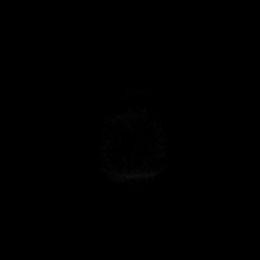

[Series 8: DWI · coronal · 4.0mm · 0.88mm/px · 3 of 36 slices shown (4 of 4)]
[im 1/36]
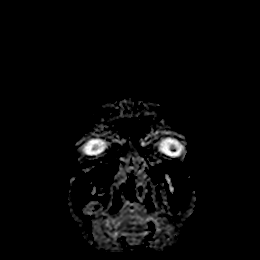
[im 18/36]
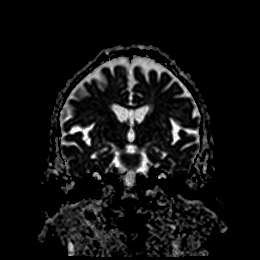
[im 36/36]
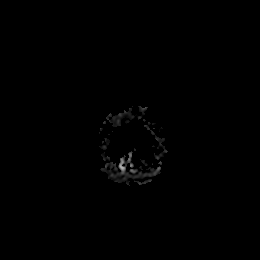

[Series 9: T1 · sagittal · 5.0mm · 0.75mm/px · 2 of 23 slices shown]
[im 1/23]
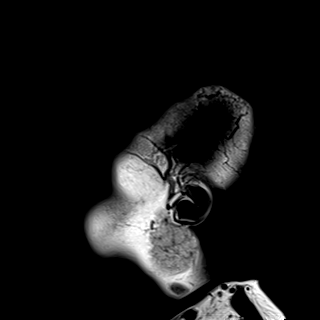
[im 23/23]
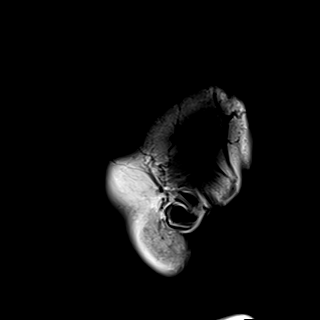

[Series 10: T2 · axial · 5.0mm · 0.72mm/px · z∈[-88,+53]mm · 2 of 25 slices shown (1 of 2)]
[im 1/25]
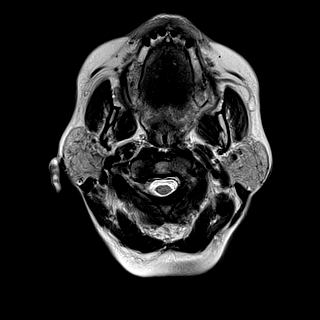
[im 25/25]
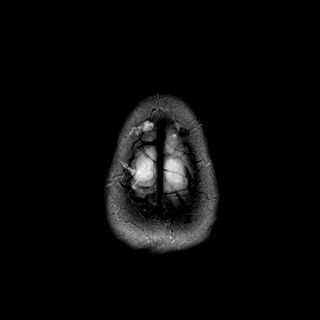

[Series 11: FLAIR · axial · 5.0mm · 0.45mm/px · z∈[-91,+51]mm · 2 of 25 slices shown]
[im 1/25]
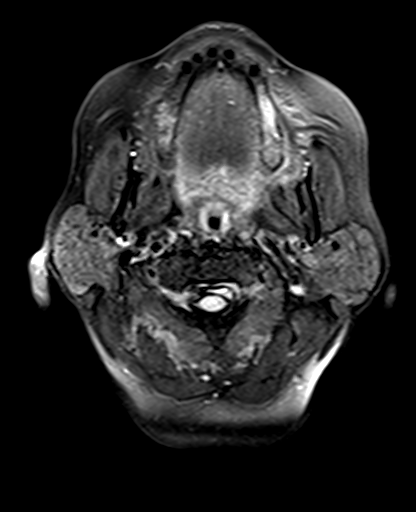
[im 25/25]
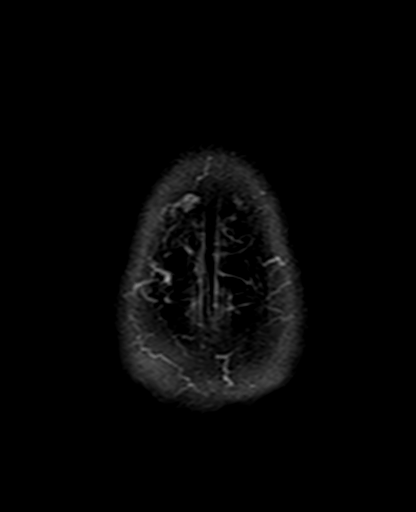

[Series 12: mag_images · axial · 3.0mm · 0.90mm/px · z∈[-100,+74]mm · 4 of 60 slices shown]
[im 1/60]
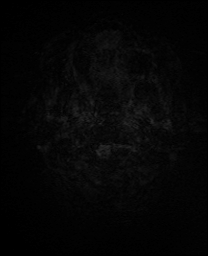
[im 20/60]
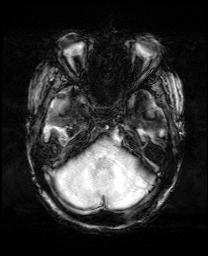
[im 40/60]
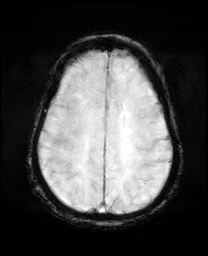
[im 60/60]
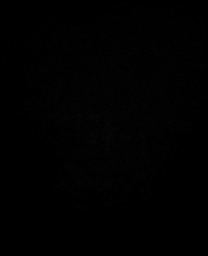

[Series 13: pha_images · axial · 3.0mm · 0.90mm/px · z∈[-100,+65]mm · 4 of 57 slices shown]
[im 1/57]
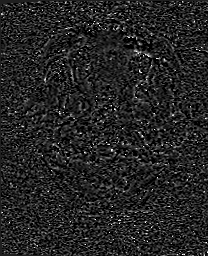
[im 19/57]
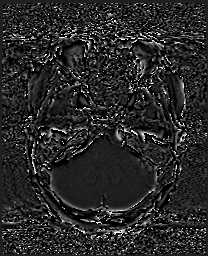
[im 38/57]
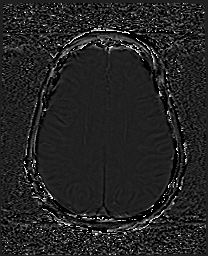
[im 57/57]
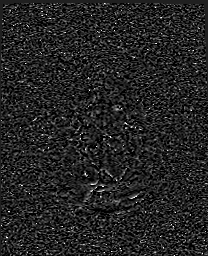

[Series 14: swi_images · axial · 3.0mm · 0.90mm/px · z∈[-100,+74]mm · 4 of 60 slices shown]
[im 1/60]
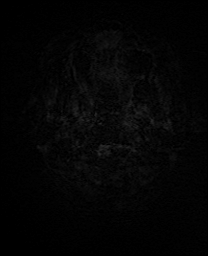
[im 20/60]
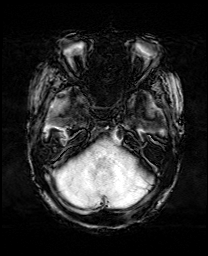
[im 40/60]
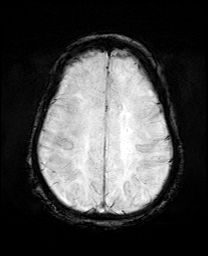
[im 60/60]
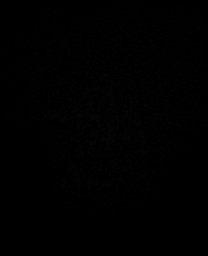

[Series 15: mip_images(sw) · axial · 24.0mm · 0.90mm/px · z∈[-90,+63]mm · 4 of 53 slices shown]
[im 1/53]
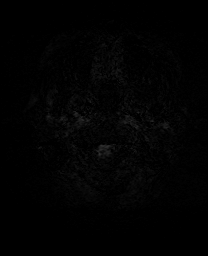
[im 18/53]
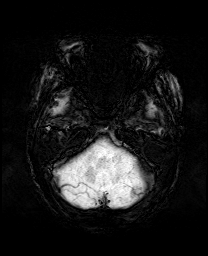
[im 35/53]
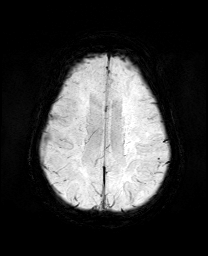
[im 53/53]
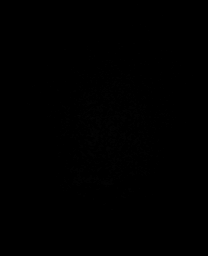

[Series 17: T2 · coronal · 5.0mm · 0.34mm/px · 2 of 29 slices shown (2 of 2)]
[im 1/29]
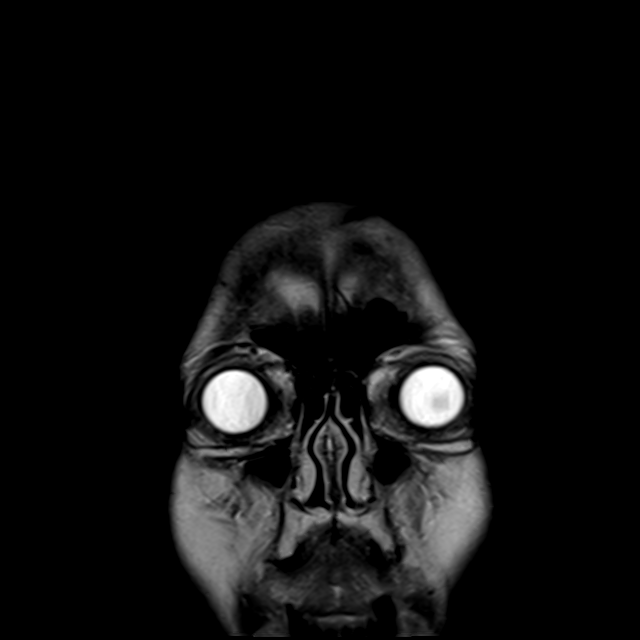
[im 29/29]
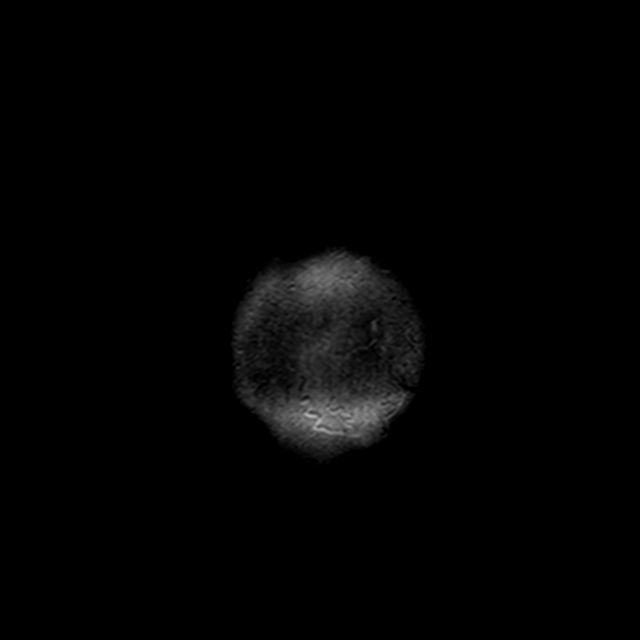

[44 of 48 positions shown; findings below may reference images not displayed]

FINDINGS: Brain: Linear focus of restricted diffusion within the right centrum
semiovale, consistent with acute/subacute infarct.

Remote lacunar infarcts in the left cerebellar hemisphere, left
basal ganglia region, left thalamus and bilateral centrum semiovale
and corona radiata. Scattered foci of T2 hyperintensity are seen
within the white matter of the cerebral hemispheres and within the
pons, nonspecific, most likely related to chronic microangiopathic
changes. No hemorrhage, hydrocephalus, extra-axial collection or
mass lesion.

Vascular: Normal flow voids.

Skull and upper cervical spine: Normal marrow signal.

Sinuses/Orbits: Negative.
IMPRESSION: 1. Linear focus of restricted diffusion within the right centrum
semiovale, consistent with small acute/subacute infarct.
2. Remote lacunar infarcts in the left cerebellar hemisphere, left
basal ganglia region, left thalamus and bilateral centrum semiovale
and corona radiata.
3. Mild chronic microangiopathic changes.

## 2020-08-16 MED ORDER — COVID-19 MRNA VACC (MODERNA) 100 MCG/0.5ML IM SUSP
0.5000 mL | Freq: Once | INTRAMUSCULAR | Status: AC
  Administered 2020-08-17: 0.5 mL via INTRAMUSCULAR
  Filled 2020-08-16: qty 0.5

## 2020-08-16 MED ORDER — SORBITOL 70 % SOLN
30.0000 mL | Freq: Once | Status: AC
  Administered 2020-08-16: 30 mL via ORAL
  Filled 2020-08-16: qty 30

## 2020-08-16 MED ORDER — STROKE: EARLY STAGES OF RECOVERY BOOK
Freq: Once | Status: AC
  Filled 2020-08-16: qty 1

## 2020-08-16 NOTE — Progress Notes (Addendum)
[facility] Progress Note  Subjective: seen in room, having some LUQ abd pain, no n/v and no confusion or jerking. Creat up 4.7 today  Vitals:   08/15/20 2132 08/16/20 0210 08/16/20 0551 08/16/20 0930  BP: (!) 152/82 (!) 144/91 (!) 141/86 140/86  Pulse: 76 78 74 73  Resp: _0 Temp: 98.4 F (36.9 C) 98.2 F (36.8 C) 98.2 F (36.8 C) 98.7 F (37.1 C)  TempSrc:    Oral  SpO2: 97% 94% 97% 99%  Weight: 81.7 kg     Height:        Exam:  alert, nad   no jvd  Chest cta bilat, no rales  Cor reg no RG  Abd soft mild LUQ tenderness, no rebound, no hsm/ ascites    Ext no LE edema   Alert, NF, ox3     Home meds:  - norvasc 10/ lasix 80 bid  - asa 81/ plavix 75  - prozac 80  - prn's/ vitamins/ supplements     UA 12/27 - 6-10 rbc, 0-5 wbc, protein > 300   UNa 69, UCr 50    Alb 3.2  Ca 8.7  tprot 6.5  eGFR 11   WBC 9K Hb 9.4     Na 137  K 4.7  CO2 18  BUN 67  Cr 4.40   ECHO 12/28 - LVEF 50-55%, +wma's inf/ basal/ septal, G2DD    CXR - IMPRESSION: Cardiomegaly with vascular congestion and probable mild interstitial edema.    CT renal stone 12/27 -  IMPRESSION: 1. No urinary tract calculi or obstructive uropathy. 2. Trace right pleural effusion. 3. Cholelithiasis without evidence of acute cholecystitis. 4. Degenerating uterine fibroid. 5. Aortic Atherosclerosis       Urine sediment (undersigned on 12/29) > dip showed 3+ protein, no LE/ nitrite/ blood. Microscopic showed scattered granular and hyaline casts, rare RBC/ WBC < 2 per HPF   Assessment/ Plan: 1. Renal failure - moved here from [place] in October. Sister says she has hospital records from this year saying "kidneys are working at 20%", she will bring them in. Pt has memory issues pretty significant for last 1-2 yrs, they are trying to get her evaluated for this. Uncont HTN w/ creat 3.7 on admit, was out of BP meds. CT and renal US show good sized kidneys, no atrophy / scarring, no obstruction.  Sediment not c/w GN, +gran cast which would suggest ATN. Urine lytes c/w ATN and/or CKD. UPC ratio is 2.7. Rales/ edema resolved w/ IV lasix x 2-3 days, will hold lasix now.  Denies nausea today. Per the sister she feels patient would want everything done (e.g., dialysis) if she were to progress to ESRD. Will follow.  2. HTN uncontrolled - BP 220/111 on presentation, then dropped to 110 / 70 range on po meds. Lowered BP meds yest, goal SBP for now 140 - 180.  3. 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 Anemia - Hb 9.4, normocytic. Could be CKD related.         Rob Thai Hemrick 08/16/2020, 11:41 AM   Recent Labs  Lab 08/14/20 0719 08/15/20 0239 08/16/20 0014  K 4.7 4.7 4.7  BUN 62* 67* 67*  CREATININE 3.85* 4.40* 4.78*  CALCIUM 9.2 8.7* 8.4*  PHOS  --  6.0*  --   HGB 9.4* 9.4*  --    Inpatient medications: . amLODipine  2.5 mg Oral BID  . aspirin EC  81 mg Oral  Daily  . atorvastatin  40 mg Oral Daily  . carvedilol  6.25 mg Oral BID WC  . Chlorhexidine Gluconate Cloth  6 each Topical Daily  . clopidogrel  75 mg Oral Daily  . FLUoxetine  80 mg Oral Daily  . heparin injection (subcutaneous)  5,000 Units Subcutaneous Q8H  . melatonin  3 mg Oral QHS  . pantoprazole  40 mg Oral Daily    acetaminophen **OR** acetaminophen, ondansetron **OR** ondansetron (ZOFRAN) IV, oxyCODONE

## 2020-08-16 NOTE — Progress Notes (Signed)
Cross-coverage note:   Notified of MRI brain findings that include small acute or subacute infarction within the right centrum semiovale.   HPI: She has hx of depression/anxiety, facial tic, HTN, suspected dementia per family, h/o CABG, and CKD who recently moved from [place], has not been taking her medications for at least a month, presented 3 days ago with urinary retention and flank pain, also complained of a few days of dizziness and difficulty with balance, and was admitted with severe hypertension, confusion, and suspected AKI on CKD.   S: Ongoing left flank pain, no headache, no chest pain or palpitations.   O: Sleeping, easily woken. Oriented to person and place only. CN II-XII grossly intact. Left grip strength 4/5, RUE and b/l LE strength 5/5. Sensation intact. Abd soft, non-distended, tender at left flank without rebound pain or guarding.   A: BP is now controlled. Nnephrology is following for her renal failure. Unclear etiology of left flank pain, no acute finding on CT abd earlier this admission and exam benign.     MRI notable for small acute/subacute right centrum semiovale infarct, remote lacunar infarcts, and mild chronic microangiopathic changes. She has been in sinus rhythm, had echo this admission with no apparent cardiogenic source of CVA, and had A1c of 6.3%.   P: Discussed MRI findings with neurology, appreciate their recommendations. Planning for MRA head and neck without contrast, fasting lipid panel, continue risk factor management, therapy assessments.

## 2020-08-16 NOTE — Progress Notes (Addendum)
PROGRESS NOTE    Karenann Mcgrory  GBT:[id number] DOB: 27-Jun-1956 DOA: 08/13/2020 PCP: Medicine, [facility]   Brief Narrative: 64 year old with past medical history questionable for hypertension, dementia possible prior CABG presented to the ED for generalized weakness and difficulty ambulating.  Patient was found to be mildly confused.  Blood pressure was 218/109.  Patient was admitted with hypertensive urgency and renal failure with a creatinine of 3.7.  Chest x-ray showed vascular congestion and possible interstitial edema.  Patient presented to the ED with flank pain, questionable urinary retention, bladder scan only showed 30 cc.    Assessment & Plan:   Principal Problem:   Hypertensive emergency Active Problems:   Essential hypertension   Memory deficit   AKI (acute kidney injury) ([place])   History of ischemic heart disease   Depression   Hypertensive urgency  1-Hypertensive urgency: Blood pressure better controlled.  Continue with norvasc and coreg.   AKI, On CKD stage IV Sister will bring records. Prior records cr at 2.5 GFR 23. Cr 4 range.  Nephrology following.   Weakness , dizziness; Confusion.  TSH and B 12 normal.  Plan to get MRI brain.  PT evaluation.   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   Lower extremity pain: Doppler 12/28 -negative  GERD;  Continue with protonix  Depression/schizophrenia Continue with prozac.   Abdominal pain;  Renal US negative for hydronephrosis.  CT renal protocol, negative for intraabdominal pathology Will order sorbitol , in case constipation.   Questionable history of dementia; She will need evaluation with neurology outpatient.   Estimated body mass index is 31.89 kg/m as calculated from the following:   Height as of this encounter: 5\' 3"  (1.6 m).   Weight as of this encounter: 81.7 kg.   DVT prophylaxis: Heparin  Code Status: Full  code Family Communication: care discussed with sister per patient request.  Disposition Plan:  Status is: Inpatient  Remains inpatient appropriate because:Ongoing diagnostic testing needed not appropriate for outpatient work up   Dispo: The patient is from: Home              Anticipated d/c is to: to be determine              Anticipated d/c date is: 2 days              Patient currently is not medically stable to d/c.        Consultants:   Nephrology   Procedures:   Renal US, negative for hydroneprhosis   Antimicrobials:    Subjective: She is alert, oriented to person and place. Report left side abdominal pain.    Objective: Vitals:   08/15/20 1759 08/15/20 2132 08/16/20 0210 08/16/20 0551  BP: 123/75 (!) 152/82 (!) 144/91 (!) 141/86  Pulse: 72 76 78 74  Resp: 16 17 18 17   Temp: 98.2 F (36.8 C) 98.4 F (36.9 C) 98.2 F (36.8 C) 98.2 F (36.8 C)  TempSrc: Oral     SpO2: 99% 97% 94% 97%  Weight:  81.7 kg    Height:        Intake/Output Summary (Last 24 hours) at 08/16/2020 0757 Last data filed at 08/16/2020 0753 Gross per 24 hour  Intake 660 ml  Output 1650 ml  Net -990 ml   Filed Weights   08/13/20 1508 08/15/20 2132  Weight: 81.6 kg 81.7 kg    Examination:  General exam: Appears calm and comfortable  Respiratory system:  CTA Cardiovascular system: S1 & S2 heard, RRR. No JVD, murmurs, rubs, gallops or clicks. No pedal edema. Gastrointestinal system: Abdomen is nondistended, soft and mild tender. No organomegaly or masses felt. Normal bowel sounds heard. Central nervous system: Alert and oriented. Extremities:  No edema  Data Reviewed: I have personally reviewed following labs and imaging studies  CBC: Recent Labs  Lab 08/13/20 1618 08/14/20 0719 08/15/20 0239  WBC 8.0 7.0 9.1  NEUTROABS 4.3  --  6.4  HGB 10.5* 9.4* 9.4*  HCT 32.8* 29.3* 29.2*  MCV 89.6 91.3 89.8  PLT 167 141* 433*   Basic Metabolic Panel: Recent Labs  Lab  08/13/20 1618 08/14/20 0719 08/15/20 0239 08/16/20 0014  NA 139 140 137 134*  K 4.4 4.7 4.7 4.7  CL 109 110 108 106  CO2 22 20* 18* 16*  GLUCOSE 118* 119* 115* 121*  BUN 65* 62* 67* 67*  CREATININE 3.72* 3.85* 4.40* 4.78*  CALCIUM 9.2 9.2 8.7* 8.4*  MG  --   --  2.0  --   PHOS  --   --  6.0*  --    GFR: Estimated Creatinine Clearance: 12.2 mL/min (A) (by C-G formula based on SCr of 4.78 mg/dL (H)). Liver Function Tests: Recent Labs  Lab 08/13/20 1618 08/15/20 0239  AST 14* 9*  ALT 15 11  ALKPHOS 68 51  BILITOT 0.4 0.5  PROT 7.4 6.5  ALBUMIN 3.8 3.2*   No results for input(s): LIPASE, AMYLASE in the last 168 hours. No results for input(s): AMMONIA in the last 168 hours. Coagulation Profile: No results for input(s): INR, PROTIME in the last 168 hours. Cardiac Enzymes: No results for input(s): CKTOTAL, CKMB, CKMBINDEX, TROPONINI in the last 168 hours. BNP (last 3 results) No results for input(s): PROBNP in the last 8760 hours. HbA1C: Recent Labs    08/13/20 1618  HGBA1C 6.3*   CBG: Recent Labs  Lab 08/14/20 1151 08/14/20 1625 08/14/20 2040 08/15/20 0001 08/15/20 0807  GLUCAP 127* 118* 176* 113* 103*   Lipid Profile: No results for input(s): CHOL, HDL, LDLCALC, TRIG, CHOLHDL, LDLDIRECT in the last 72 hours. Thyroid Function Tests: Recent Labs    08/13/20 2328  TSH 2.066   Anemia Panel: Recent Labs    08/13/20 2328  VITAMINB12 420   Sepsis Labs: No results for input(s): PROCALCITON, LATICACIDVEN in the last 168 hours.  Recent Results (from the past 240 hour(s))  SARS CORONAVIRUS 2 (TAT 6-24 HRS) Nasopharyngeal Nasopharyngeal Swab     Status: None   Collection Time: 08/13/20  8:22 PM   Specimen: Nasopharyngeal Swab  Result Value Ref Range Status   SARS Coronavirus 2 NEGATIVE NEGATIVE Final    Comment: (NOTE) SARS-CoV-2 target nucleic acids are NOT DETECTED.  The SARS-CoV-2 RNA is generally detectable in upper and lower respiratory specimens  during the acute phase of infection. Negative results do not preclude SARS-CoV-2 infection, do not rule out co-infections with other pathogens, and should not be used as the sole basis for treatment or other patient management decisions. Negative results must be combined with clinical observations, patient history, and epidemiological information. The expected result is Negative.  Fact Sheet for Patients: SugarRoll.be  Fact Sheet for Healthcare Providers: https://www.woods-mathews.com/  This test is not yet approved or cleared by the [place] FDA and  has been authorized for detection and/or diagnosis of SARS-CoV-2 by FDA under an Emergency Use Authorization (EUA). This EUA will remain  in effect (meaning this test can be  used) for the duration of the COVID-19 declaration under Se ction 564(b)(1) of the Act, 21 U.S.C. section 360bbb-3(b)(1), unless the authorization is terminated or revoked sooner.  Performed at [facility], [place] [address] [place], [place] [address]   MRSA PCR Screening     Status: None   Collection Time: 08/14/20  3:38 AM   Specimen: Nasopharyngeal  Result Value Ref Range Status   MRSA by PCR NEGATIVE NEGATIVE Final    Comment:        The GeneXpert MRSA Assay (FDA approved for NASAL specimens only), is one component of a comprehensive MRSA colonization surveillance program. It is not intended to diagnose MRSA infection nor to guide or monitor treatment for MRSA infections. Performed at [facility], [place] [address] [place], [place] [address]          Radiology Studies: US RENAL  Result Date: 08/15/2020 CLINICAL DATA:  Evaluate etiology of renal failure. EXAM: RENAL / URINARY TRACT ULTRASOUND COMPLETE COMPARISON:  08/13/2020 FINDINGS: Right Kidney: Renal measurements: 10.4 x 4.9 x 5.9 cm = volume: 158 mL. Echogenicity within normal limits. No mass or hydronephrosis visualized.  Left Kidney: Renal measurements: 10.0 x 5.3 x 5.5 cm = volume: 152 mL. Echogenicity within normal limits. No mass or hydronephrosis visualized. Bladder: Appears normal for degree of bladder distention. Other: None. IMPRESSION: 1. Normal renal sonogram. Electronically Signed   By: Kerby Moors M.D.   On: 08/15/2020 16:51   ECHOCARDIOGRAM COMPLETE  Result Date: 08/14/2020    ECHOCARDIOGRAM REPORT   Patient Name:   DENIM START Date of Exam: 08/14/2020 Medical Rec #:  [id number]    Height:       63.0 in Accession #:    [id number]   Weight:       180.0 lb Date of Birth:  04-Apr-1956    BSA:          1.849 m Patient Age:    14 years     BP:           154/87 mmHg Patient Gender: F            HR:           72 bpm. Exam Location:  Inpatient Procedure: 2D Echo Indications:    Congestive Heart Failure  History:        Patient has no prior history of Echocardiogram examinations.  Sonographer:    Mikki Santee RDCS (AE) Referring Phys: [id number] AMY N [facility] IMPRESSIONS  1. Left ventricular ejection fraction, by estimation, is 50 to 55%. The left ventricle has low normal function. The left ventricle demonstrates regional wall motion abnormalities (see scoring diagram/findings for description). There is mild concentric left ventricular hypertrophy. Left ventricular diastolic parameters are consistent with Grade II diastolic dysfunction (pseudonormalization). Elevated left atrial pressure. There is mild hypokinesis of the left ventricular, basal-mid inferior wall and inferoseptal wall.  2. Right ventricular systolic function is normal. The right ventricular size is normal. There is normal pulmonary artery systolic pressure.  3. Left atrial size was mildly dilated.  4. The mitral valve is normal in structure. Moderate mitral valve regurgitation.  5. The aortic valve is tricuspid. Aortic valve regurgitation is not visualized. No aortic stenosis is present.  6. The inferior vena cava is normal in size with greater than 50%  respiratory variability, suggesting right atrial pressure of 3 mmHg. Comparison(s): No prior Echocardiogram. FINDINGS  Left Ventricle: Left ventricular ejection fraction, by estimation, is 50 to 55%. The  left ventricle has low normal function. The left ventricle demonstrates regional wall motion abnormalities. Mild hypokinesis of the left ventricular, basal-mid inferior  wall and inferoseptal wall. The left ventricular internal cavity size was normal in size. There is mild concentric left ventricular hypertrophy. Abnormal (paradoxical) septal motion consistent with post-operative status. Left ventricular diastolic parameters are consistent with Grade II diastolic dysfunction (pseudonormalization). Elevated left atrial pressure. Right Ventricle: The right ventricular size is normal. No increase in right ventricular wall thickness. Right ventricular systolic function is normal. There is normal pulmonary artery systolic pressure. The tricuspid regurgitant velocity is 1.88 m/s, and  with an assumed right atrial pressure of 3 mmHg, the estimated right ventricular systolic pressure is 21.2 mmHg. Left Atrium: Left atrial size was mildly dilated. Right Atrium: Right atrial size was normal in size. Pericardium: There is no evidence of pericardial effusion. Mitral Valve: The mitral valve is normal in structure. Moderate mitral valve regurgitation, with centrally-directed jet. Tricuspid Valve: The tricuspid valve is normal in structure. Tricuspid valve regurgitation is trivial. Aortic Valve: The aortic valve is tricuspid. Aortic valve regurgitation is not visualized. No aortic stenosis is present. Pulmonic Valve: The pulmonic valve was normal in structure. Pulmonic valve regurgitation is not visualized. Aorta: The aortic root and ascending aorta are structurally normal, with no evidence of dilitation. Venous: The inferior vena cava is normal in size with greater than 50% respiratory variability, suggesting right atrial  pressure of 3 mmHg. IAS/Shunts: No atrial level shunt detected by color flow Doppler.  LEFT VENTRICLE PLAX 2D LVIDd:         4.30 cm  Diastology LVIDs:         3.20 cm  LV e' medial:    5.03 cm/s LV PW:         1.30 cm  LV E/e' medial:  20.3 LV IVS:        1.20 cm  LV e' lateral:   4.54 cm/s LVOT diam:     2.20 cm  LV E/e' lateral: 22.5 LV SV:         62 LV SV Index:   34 LVOT Area:     3.80 cm  LEFT ATRIUM             Index       RIGHT ATRIUM           Index LA diam:        3.70 cm 2.00 cm/m  RA Area:     11.40 cm LA Vol (A2C):   72.4 ml 39.16 ml/m RA Volume:   19.80 ml  10.71 ml/m LA Vol (A4C):   59.4 ml 32.13 ml/m LA Biplane Vol: 67.2 ml 36.34 ml/m  AORTIC VALVE LVOT Vmax:   69.80 cm/s LVOT Vmean:  47.800 cm/s LVOT VTI:    0.163 m  AORTA Ao Root diam: 3.00 cm MITRAL VALVE                 TRICUSPID VALVE MV Area (PHT): 4.15 cm      TR Peak grad:   14.1 mmHg MV Decel Time: 183 msec      TR Vmax:        188.00 cm/s MR Peak grad:    96.0 mmHg MR Mean grad:    63.0 mmHg   SHUNTS MR Vmax:         490.00 cm/s Systemic VTI:  0.16 m MR Vmean:        380.0 cm/s  Systemic Diam: 2.20  cm MR PISA:         2.26 cm MR PISA Eff ROA: 18 mm MR PISA Radius:  0.60 cm MV E velocity: 102.00 cm/s MV A velocity: 80.60 cm/s MV E/A ratio:  1.27 Mihai Croitoru MD Electronically signed by Sanda Klein MD Signature Date/Time: 08/14/2020/11:49:55 AM    Final    VAS [place] LOWER EXTREMITY VENOUS (DVT)  Result Date: 08/14/2020  Lower Venous DVT Study Indications: Edema.  Risk Factors: None identified. Comparison Study: No prior studies. Performing Technologist: Oliver Hum RVT  Examination Guidelines: A complete evaluation includes B-mode imaging, spectral Doppler, color Doppler, and power Doppler as needed of all accessible portions of each vessel. Bilateral testing is considered an integral part of a complete examination. Limited examinations for reoccurring indications may be performed as noted. The reflux portion of the exam  is performed with the patient in reverse Trendelenburg.  +---------+---------------+---------+-----------+----------+--------------+ RIGHT    CompressibilityPhasicitySpontaneityPropertiesThrombus Aging +---------+---------------+---------+-----------+----------+--------------+ CFV      Full           Yes      Yes                                 +---------+---------------+---------+-----------+----------+--------------+ SFJ      Full                                                        +---------+---------------+---------+-----------+----------+--------------+ FV Prox  Full                                                        +---------+---------------+---------+-----------+----------+--------------+ FV Mid   Full                                                        +---------+---------------+---------+-----------+----------+--------------+ FV DistalFull                                                        +---------+---------------+---------+-----------+----------+--------------+ PFV      Full                                                        +---------+---------------+---------+-----------+----------+--------------+ POP      Full           Yes      Yes                                 +---------+---------------+---------+-----------+----------+--------------+ PTV      Full                                                        +---------+---------------+---------+-----------+----------+--------------+  PERO     Full                                                        +---------+---------------+---------+-----------+----------+--------------+   +---------+---------------+---------+-----------+----------+--------------+ LEFT     CompressibilityPhasicitySpontaneityPropertiesThrombus Aging +---------+---------------+---------+-----------+----------+--------------+ CFV      Full           Yes      Yes                                  +---------+---------------+---------+-----------+----------+--------------+ SFJ      Full                                                        +---------+---------------+---------+-----------+----------+--------------+ FV Prox  Full                                                        +---------+---------------+---------+-----------+----------+--------------+ FV Mid   Full                                                        +---------+---------------+---------+-----------+----------+--------------+ FV DistalFull                                                        +---------+---------------+---------+-----------+----------+--------------+ PFV      Full                                                        +---------+---------------+---------+-----------+----------+--------------+ POP      Full           Yes      Yes                                 +---------+---------------+---------+-----------+----------+--------------+ PTV      Full                                                        +---------+---------------+---------+-----------+----------+--------------+ PERO     Full                                                        +---------+---------------+---------+-----------+----------+--------------+  Summary: RIGHT: - There is no evidence of deep vein thrombosis in the lower extremity.  - No cystic structure found in the popliteal fossa.  LEFT: - There is no evidence of deep vein thrombosis in the lower extremity.  - No cystic structure found in the popliteal fossa.  *See table(s) above for measurements and observations. Electronically signed by Servando Snare MD on 08/14/2020 at 2:12:38 PM.    Final         Scheduled Meds: . amLODipine  2.5 mg Oral BID  . aspirin EC  81 mg Oral Daily  . atorvastatin  40 mg Oral Daily  . carvedilol  6.25 mg Oral BID WC  . Chlorhexidine Gluconate Cloth  6 each Topical Daily  . clopidogrel   75 mg Oral Daily  . FLUoxetine  80 mg Oral Daily  . heparin injection (subcutaneous)  5,000 Units Subcutaneous Q8H  . melatonin  3 mg Oral QHS  . pantoprazole  40 mg Oral Daily   Continuous Infusions:   LOS: 2 days    Time spent: 35 minutes    Latashia Koch A Analis Distler, MD [facility]   If 7PM-7AM, please contact night-coverage [email or web address]  08/16/2020, 7:57 AM

## 2020-08-16 NOTE — TOC Progression Note (Signed)
Transition of Care [facility]) - Progression Note    Patient Details  Name: Christina Nunez MRN: [id number] Date of Birth: 1956-06-17  [facility] CM/SW Contact  Storie Heffern, Juliann Pulse, RN Phone Number: 08/16/2020, 10:30 AM  Clinical Narrative: Received message from [place] are unable to get auth since prior [facility] rep Otila Kluver has been made aware to cancel the auth. Patient/sister chose [facility].      Expected Discharge Plan: Skilled Nursing Facility Barriers to Discharge: Insurance Authorization  Expected Discharge Plan and Services Expected Discharge Plan: [place]   Discharge Planning Services: CM Consult   Living arrangements for the past 2 months: Single Family Home                                       Social Determinants of Health (SDOH) Interventions    Readmission Risk Interventions No flowsheet data found.

## 2020-08-16 NOTE — Plan of Care (Signed)
  Problem: Education: Goal: Knowledge of General Education information will improve Description: Including pain rating scale, medication(s)/side effects and non-pharmacologic comfort measures Outcome: Progressing   Problem: Activity: Goal: Risk for activity intolerance will decrease Outcome: Progressing   Problem: Nutrition: Goal: Adequate nutrition will be maintained Outcome: Progressing   

## 2020-08-16 NOTE — Consult Note (Addendum)
Neurology Consultation  Reason for Consult: Stroke on MRI Referring Physician: Dr. Myna Hidalgo  CC: Weakness and imbalance as noted on the admission H&P  History is obtained from: Essentially chart review  HPI: Tamira Ryland is a 64 y.o. female past medical history of hypertension, dementia, depression/anxiety, possible coronary artery disease status post CABG, admitted to the hospital 08/14/2020 for evaluation and management of weakness and imbalance, attributed likely to deranged renal function as well as hypertensive urgency along with troponinemia, who got an MRI of the brain for evaluation of weakness and debility was noted to have a lacunar-looking infarct on the MRI for which neurological consultation was obtained. According to the chart review, the patient has moved to [facility] from [place] much of her medical records are available to review yet. She has been living with family and had been complaining of imbalance and dizziness intermittently since she moves here. Patient also came in complaining of right low back pain and right-sided abdominal pain which made the family bring her to the ER. Patient has been out of her blood pressure medications ever since moving to [place] in October 2021. Family reports that there has been no acute changes neurologically based on my chart review but that her weakness and debility has progressed since her move in October of this year.  Patient also reiterated the above findings and said that she came to the hospital because of abdominal pain, weakness and dizziness. She denies focal tingling numbness or weakness. She denied visual symptoms. She endorses a headache off and on.  Patient was noted to have severely deranged renal function and worsening of the renal function continues with nephrology currently assisting with management.     LKW: Unclear tpa given?: no, unclear last known well Premorbid modified Rankin scale (mRS): 2-3 based on  chart review  ROS: Unable to reliably ascertain due to patient's mentation  History reviewed. No pertinent past medical history. Noted above in the HPI.  History reviewed. No pertinent family history. Unable to ascertain  Social History:   has no history on file for tobacco use, alcohol use, and drug use. Unable to ascertain  Medications  Current Facility-Administered Medications:  .   stroke: mapping our early stages of recovery book, , Does not apply, Once, Opyd, Ilene Qua, MD .  acetaminophen (TYLENOL) tablet 325 mg, 325 mg, Oral, Q6H PRN, 325 mg at 08/15/20 1557 **OR** acetaminophen (TYLENOL) suppository 325 mg, 325 mg, Rectal, Q6H PRN, Cox, Amy N, DO .  amLODipine (NORVASC) tablet 2.5 mg, 2.5 mg, Oral, BID, Roney Jaffe, MD, 2.5 mg at 08/16/20 2135 .  aspirin EC tablet 81 mg, 81 mg, Oral, Daily, Alekh, Kshitiz, MD, 81 mg at 08/16/20 0848 .  atorvastatin (LIPITOR) tablet 40 mg, 40 mg, Oral, Daily, Cox, Amy N, DO, 40 mg at 08/16/20 0848 .  carvedilol (COREG) tablet 6.25 mg, 6.25 mg, Oral, BID WC, Roney Jaffe, MD, 6.25 mg at 08/16/20 1753 .  Chlorhexidine Gluconate Cloth 2 % PADS 6 each, 6 each, Topical, Daily, Cox, Amy N, DO, 6 each at 08/16/20 2139 .  clopidogrel (PLAVIX) tablet 75 mg, 75 mg, Oral, Daily, Alekh, Kshitiz, MD, 75 mg at 08/16/20 0848 .  [START ON 08/17/2020] COVID-19 mRNA vaccine (Moderna) injection 0.5 mL, 0.5 mL, Intramuscular, ONCE-1600, Regalado, Belkys A, MD .  FLUoxetine (PROZAC) capsule 80 mg, 80 mg, Oral, Daily, Cox, Amy N, DO, 80 mg at 08/16/20 0848 .  heparin injection 5,000 Units, 5,000 Units, Subcutaneous, Q8H, Cox, Amy N, DO,  5,000 Units at 08/16/20 2135 .  melatonin tablet 3 mg, 3 mg, Oral, QHS, Cox, Amy N, DO, 3 mg at 08/16/20 2135 .  ondansetron (ZOFRAN) tablet 4 mg, 4 mg, Oral, Q6H PRN **OR** ondansetron (ZOFRAN) injection 4 mg, 4 mg, Intravenous, Q6H PRN, Cox, Amy N, DO .  oxyCODONE (Oxy IR/ROXICODONE) immediate release tablet 5 mg, 5 mg, Oral,  Q6H PRN, Starla Link, Kshitiz, MD, 5 mg at 08/16/20 2135 .  pantoprazole (PROTONIX) EC tablet 40 mg, 40 mg, Oral, Daily, Cox, Amy N, DO, 40 mg at 08/16/20 0849  Exam: Current vital signs: BP (!) 149/85 (BP Location: Right Arm)   Pulse 75   Temp 99.2 F (37.3 C) (Oral)   Resp 16   Ht 5\' 3"  (1.6 m)   Wt 81.7 kg   SpO2 97%   BMI 31.89 kg/m  Vital signs in last 24 hours: Temp:  [98.2 F (36.8 C)-99.5 F (37.5 C)] 99.2 F (37.3 C) (12/30 2051) Pulse Rate:  [71-78] 75 (12/30 2051) Resp:  [14-18] 16 (12/30 2051) BP: (138-149)/(83-91) 149/85 (12/30 2051) SpO2:  [94 %-100 %] 97 % (12/30 2051) General: Awake alert in no distress HEENT: Normocephalic atraumatic lungs: Clear Cardiovascular: Regular rate rhythm Abdomen nondistended nontender Extremities well perfused Neurological exam She is awake, alert, oriented to self and the fact that she is in [place]. Could not tell me the name of the hospital. She can tell me the month but not the exact date. She told me her age to be 64 years old. She is 64 years old. She was able to name simple objects. She was able to repeat She was able to follow simple commands without hesitation. She had poor attention concentration. Her affect also appeared abnormal-she appeared to get startled when I asked her to raise her arms or raise her legs but would follow commands. Cranial nerves II to XII intact Motor exam: Bilateral upper extremities 5/5, 4+/5 right hip flexor, 5/5 left hip flexors, otherwise lower extremities 5/5. Sensory exam: Intact to touch Coordination: Intact-with no dysmetria bilaterally. Gait testing was deferred NIH stroke scale 1a Level of Conscious.: 0 1b LOC Questions: 1 1c LOC Commands: 0 2 Best Gaze: 0 3 Visual: 0 4 Facial Palsy: 0 5a Motor Arm - left: 0 5b Motor Arm - Right: 0 6a Motor Leg - Left: 0 6b Motor Leg - Right:0 7 Limb Ataxia: 0 8 Sensory: 0 9 Best Language: 0 10 Dysarthria: 0 11 Extinct. and Inatten.:  0 TOTAL: 1  Labs I have reviewed labs in epic and the results pertinent to this consultation are:  CBC    Component Value Date/Time   WBC 9.1 08/15/2020 0239   RBC 3.25 (L) 08/15/2020 0239   HGB 9.4 (L) 08/15/2020 0239   HCT 29.2 (L) 08/15/2020 0239   PLT 139 (L) 08/15/2020 0239   MCV 89.8 08/15/2020 0239   MCH 28.9 08/15/2020 0239   MCHC 32.2 08/15/2020 0239   RDW 15.1 08/15/2020 0239   LYMPHSABS 1.3 08/15/2020 0239   MONOABS 1.1 (H) 08/15/2020 0239   EOSABS 0.3 08/15/2020 0239   BASOSABS 0.0 08/15/2020 0239    CMP     Component Value Date/Time   NA 134 (L) 08/16/2020 0014   K 4.7 08/16/2020 0014   CL 106 08/16/2020 0014   CO2 16 (L) 08/16/2020 0014   GLUCOSE 121 (H) 08/16/2020 0014   BUN 67 (H) 08/16/2020 0014   CREATININE 4.78 (H) 08/16/2020 0014   CALCIUM 8.4 (L) 08/16/2020 0014  PROT 6.5 08/15/2020 0239   ALBUMIN 3.2 (L) 08/15/2020 0239   AST 9 (L) 08/15/2020 0239   ALT 11 08/15/2020 0239   ALKPHOS 51 08/15/2020 0239   BILITOT 0.5 08/15/2020 0239   GFRNONAA 10 (L) 08/16/2020 0014  A1c 6.3 TSH normal  2D echo IMPRESSIONS  1. Left ventricular ejection fraction, by estimation, is 50 to 55%. The  left ventricle has low normal function. The left ventricle demonstrates  regional wall motion abnormalities (see scoring diagram/findings for  description). There is mild concentric  left ventricular hypertrophy. Left ventricular diastolic parameters are  consistent with Grade II diastolic dysfunction (pseudonormalization).  Elevated left atrial pressure. There is mild hypokinesis of the left  ventricular, basal-mid inferior wall and  inferoseptal wall.  2. Right ventricular systolic function is normal. The right ventricular  size is normal. There is normal pulmonary artery systolic pressure.  3. Left atrial size was mildly dilated.  4. The mitral valve is normal in structure. Moderate mitral valve  regurgitation.  5. The aortic valve is tricuspid. Aortic  valve regurgitation is not  visualized. No aortic stenosis is present.  6. The inferior vena cava is normal in size with greater than 50%  respiratory variability, suggesting right atrial pressure of 3 mmHg.   Imaging I have reviewed the images obtained:  CT-scan of the brain-08/13/2020 with chronic small vessel disease without acute intracranial abnormality.  MRI examination of the brain-08/16/2020-a linear focus of restricted diffusion within the right centrum semiovale consistent with small acute/subacute infarct. Remote lacunar infarcts in the left cerebral hemisphere left basal ganglia left thalamus and bilateral centrum semiovale along with coronary artery. Mild chronic microangiopathic changes seen.  Assessment:  64 year old woman, with past medical history of questionable compliance to medications, hypertension and other cerebrovascular risk factors presented for evaluation of weakness and dizziness and noted to have hypertensive emergency for which she is being treated. Also noted to have severely deranged renal function which nephrology is managing at this time. Work-up for weakness involved an MRI of the brain that was done which showed a linear area of restricted diffusion representing an acute/subacute stroke in the right centrum semiovale. Her clinical exam does not correlate with the stroke and the stroke is likely incidental in the setting of multiple risk factors.  Impression: Acute ischemic stroke-likely small vessel etiology given risk factors  Recommendations: Given deranged renal function, would not do CTA head and neck. Rather would prefer MRA of the head and neck without contrast. Continue telemetry Continue frequent neurochecks Currently on dual antiplatelets-continue for now. A1c completed. Obtain lipid panel 2D echo completed. PT OT speech therapy No need for permissive hypertension as the last known well is will be ordered the window for permissive  hypertension. Medical management including management of deranged renal function per primary team as well as nephrology.  Discussed with Dr. Myna Hidalgo. Stroke team will follow.  -- Amie Portland, MD [facility] Pager: [phone] If 7pm to 7am, please call on call as listed on AMION.

## 2020-08-17 ENCOUNTER — Inpatient Hospital Stay (HOSPITAL_COMMUNITY): Payer: Medicaid Other

## 2020-08-17 DIAGNOSIS — I10 Essential (primary) hypertension: Secondary | ICD-10-CM | POA: Diagnosis not present

## 2020-08-17 DIAGNOSIS — I160 Hypertensive urgency: Secondary | ICD-10-CM

## 2020-08-17 DIAGNOSIS — I639 Cerebral infarction, unspecified: Secondary | ICD-10-CM | POA: Diagnosis not present

## 2020-08-17 DIAGNOSIS — I161 Hypertensive emergency: Secondary | ICD-10-CM | POA: Diagnosis not present

## 2020-08-17 LAB — BASIC METABOLIC PANEL
Anion gap: 10 (ref 5–15)
BUN: 67 mg/dL — ABNORMAL HIGH (ref 8–23)
CO2: 21 mmol/L — ABNORMAL LOW (ref 22–32)
Calcium: 8.7 mg/dL — ABNORMAL LOW (ref 8.9–10.3)
Chloride: 104 mmol/L (ref 98–111)
Creatinine, Ser: 4.79 mg/dL — ABNORMAL HIGH (ref 0.44–1.00)
GFR, Estimated: 10 mL/min — ABNORMAL LOW (ref >60)
Glucose, Bld: 109 mg/dL — ABNORMAL HIGH (ref 70–99)
Potassium: 4.6 mmol/L (ref 3.5–5.1)
Sodium: 135 mmol/L (ref 135–145)

## 2020-08-17 LAB — LIPID PANEL
Cholesterol: 190 mg/dL (ref 0–200)
HDL: 37 mg/dL — ABNORMAL LOW (ref >40)
LDL Cholesterol: 128 mg/dL — ABNORMAL HIGH (ref 0–99)
Total CHOL/HDL Ratio: 5.1 RATIO
Triglycerides: 123 mg/dL (ref <150)
VLDL: 25 mg/dL (ref 0–40)

## 2020-08-17 IMAGING — DX DG ABDOMEN 1V
2 series · 2 of 2 positions shown · non-contrast
Comparison: [DATE] CT abdomen/pelvis

CLINICAL DATA: Abdominal pain

EXAM:
ABDOMEN - 1 VIEW

[abdomen kub (1 of 2)]
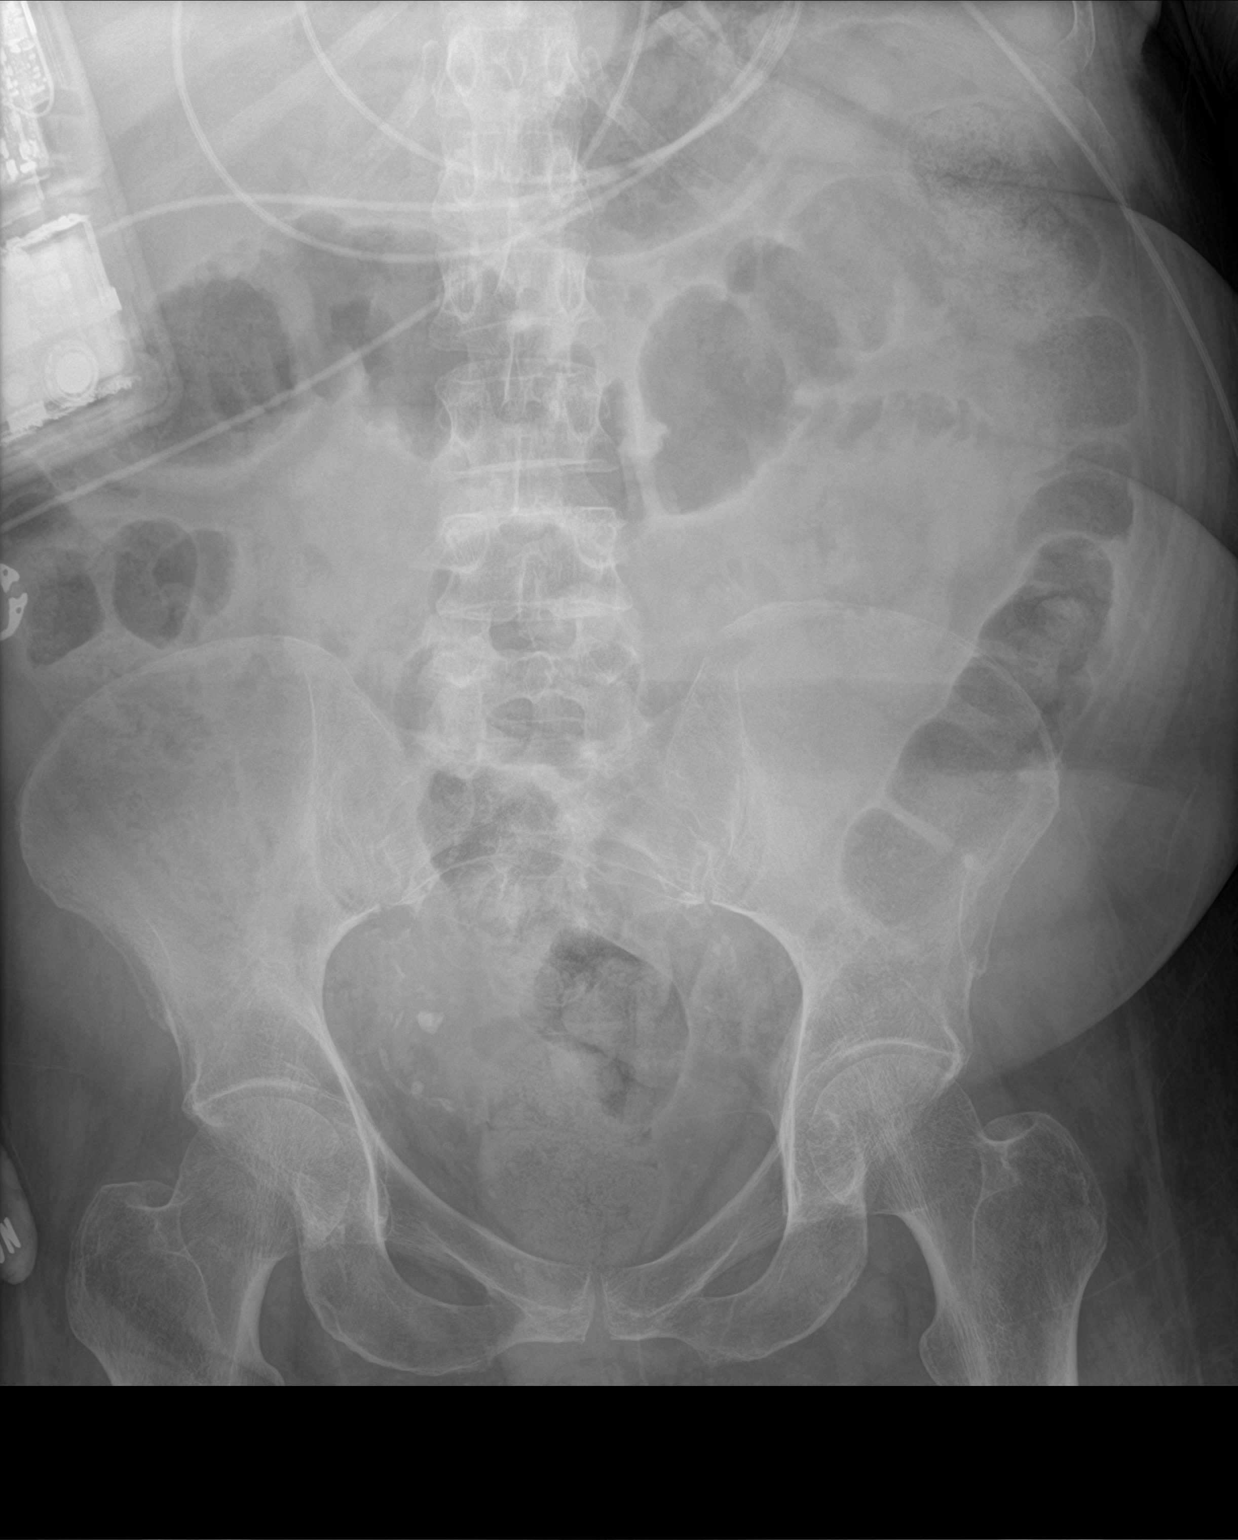

[abdomen kub (2 of 2)]
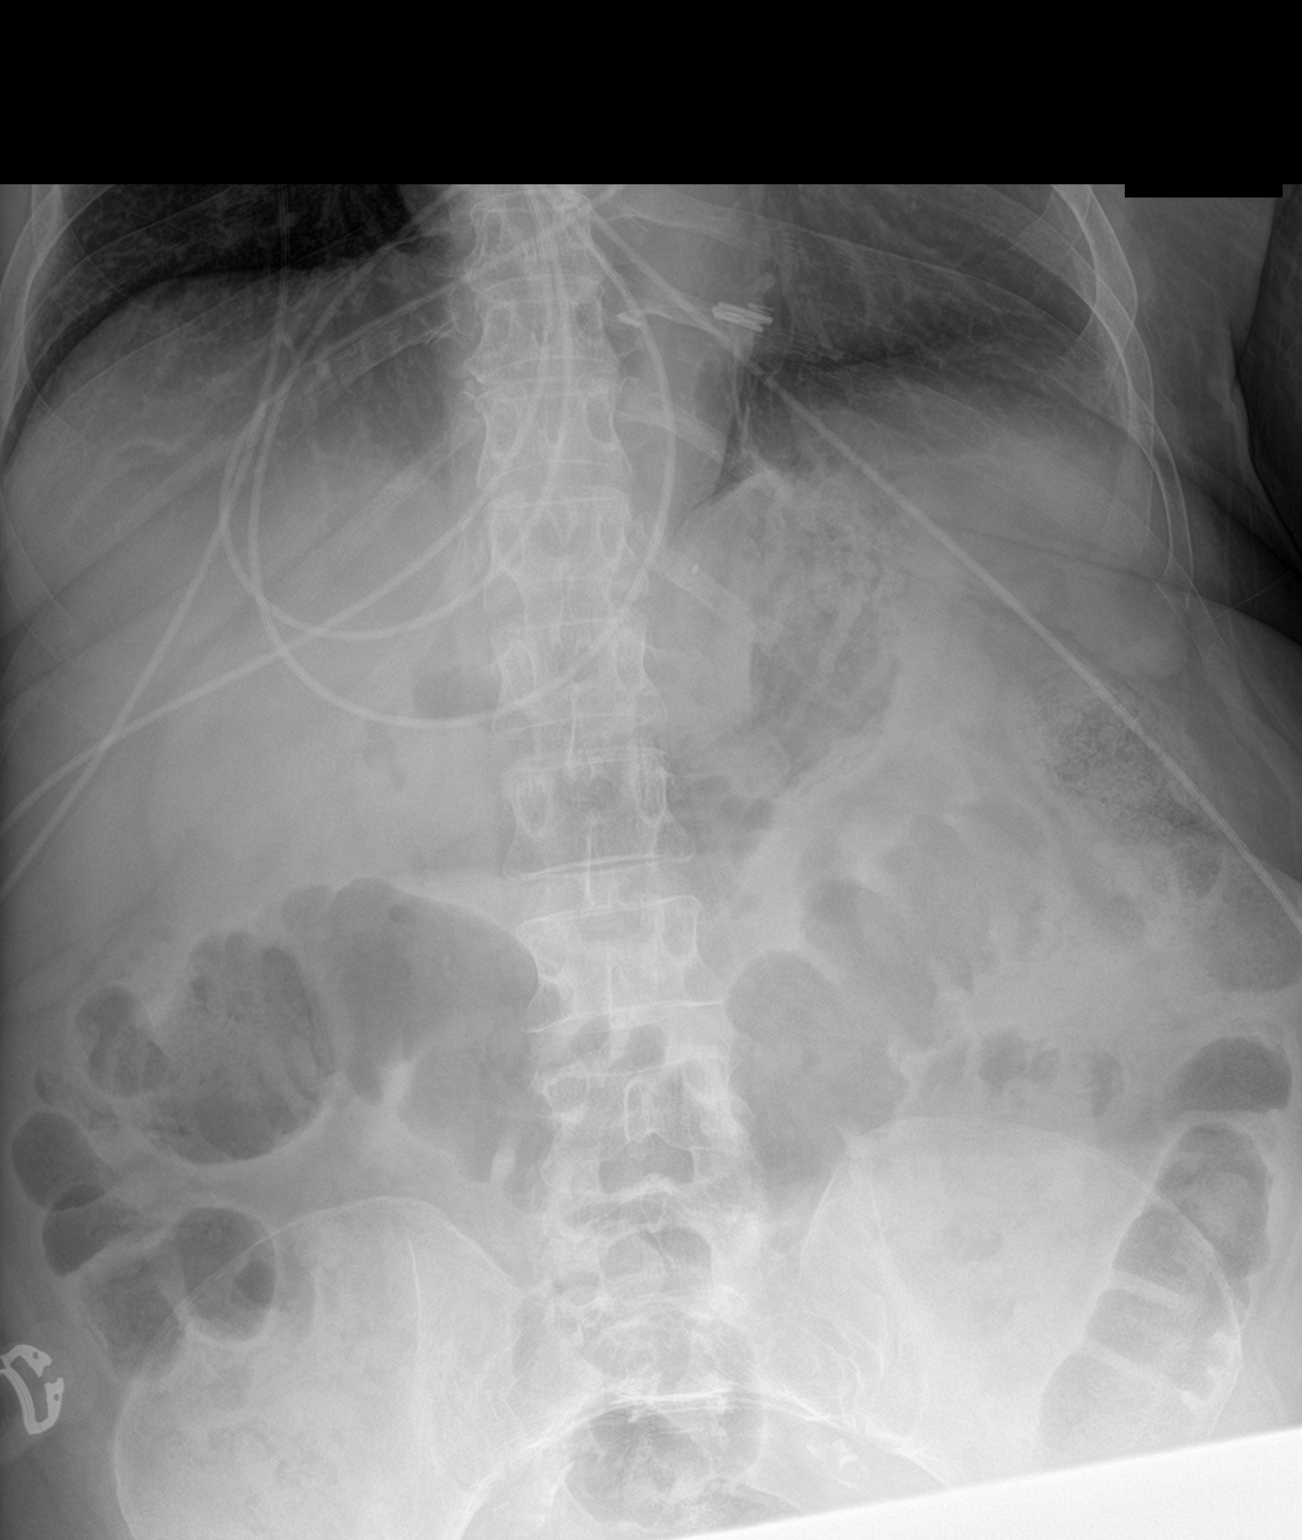

[2 of 2 positions shown; findings below may reference images not displayed]

FINDINGS: No disproportionately dilated small bowel loops. No evidence of
pneumatosis or pneumoperitoneum. Moderate stool and gas throughout
the large bowel. No radiopaque nephrolithiasis. Stable tiny
calcified right pelvic uterine fibroid as seen on recent CT. Clear
lung bases.
IMPRESSION: Nonobstructive bowel gas pattern. Moderate colonic stool and gas,
which may indicate constipation.

## 2020-08-17 IMAGING — MR MR MRA NECK W/O CM
5 series · 43 of 48 positions shown · non-contrast
Comparison: None.

CLINICAL DATA: 64-year-old female with recent delirium common
cephalopathy. Small acute white matter infarct in the anterior left
centrum semiovale on MRI yesterday.

EXAM:
MRA NECK WITHOUT CONTRAST
TECHNIQUE: Angiographic images of the neck were obtained using MRA technique
without intravenous contrast. Carotid stenosis measurements (when
applicable) are obtained utilizing NASCET criteria, using the distal
internal carotid diameter as the denominator.

[Series 9: tof_fl3d_tra_iso · axial · 0.6mm · 0.52mm/px · z∈[-169,-90]mm · 11 of 133 slices shown (1 of 2)]
[im 1/133]
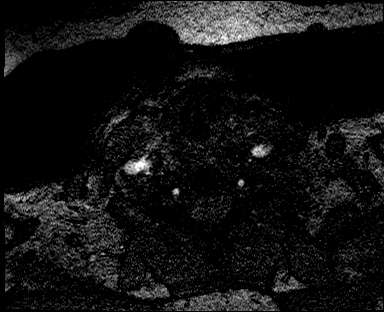
[im 14/133]
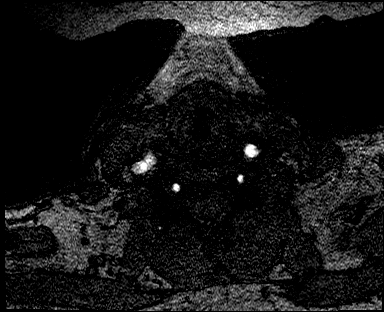
[im 27/133]
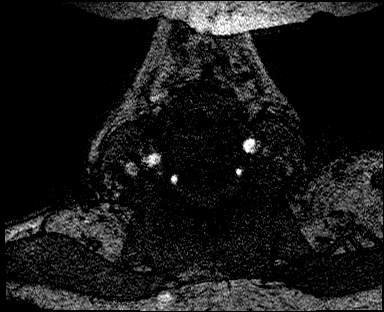
[im 40/133]
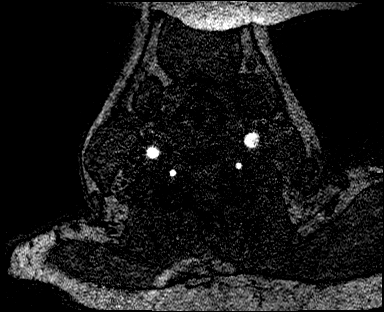
[im 53/133]
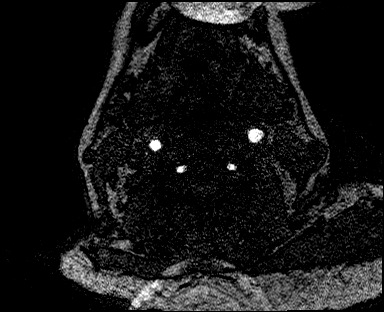
[im 67/133]
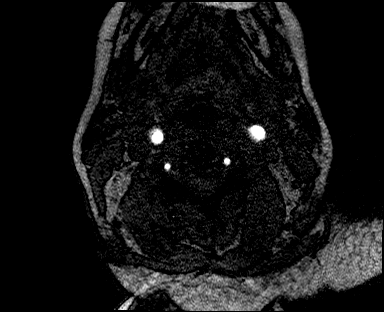
[im 80/133]
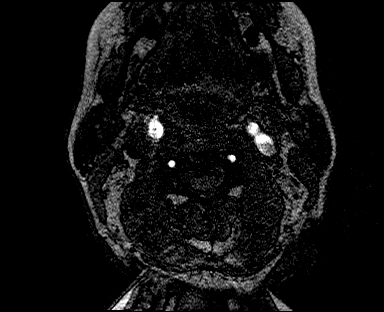
[im 93/133]
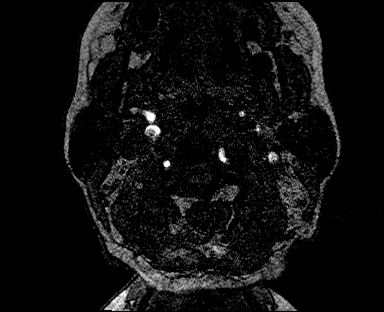
[im 106/133]
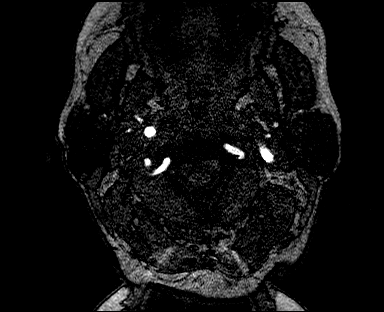
[im 119/133]
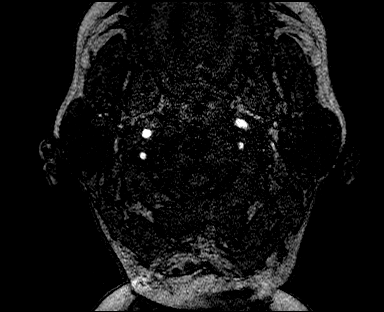
[im 133/133]
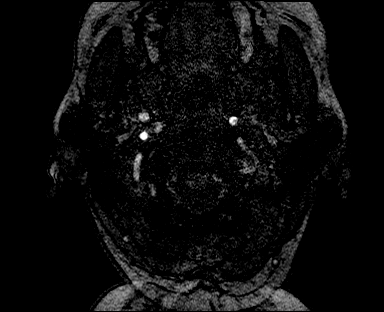

[Series 12: tof_fl2d_tra · axial · 3.0mm · 0.78mm/px · z∈[-246,-58]mm · 8 of 91 slices shown]
[im 1/91]
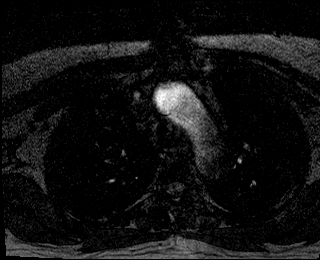
[im 13/91]
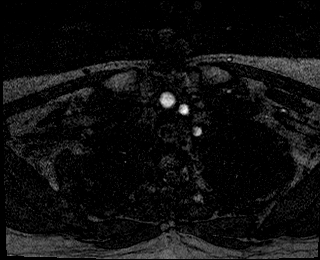
[im 26/91]
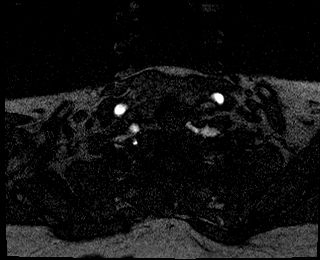
[im 39/91]
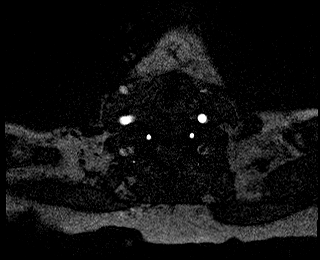
[im 52/91]
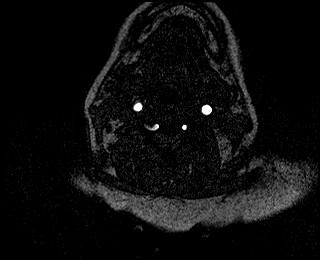
[im 65/91]
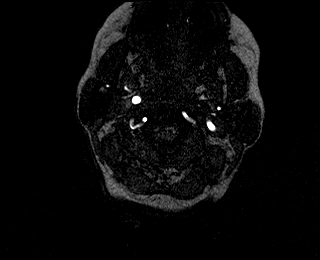
[im 78/91]
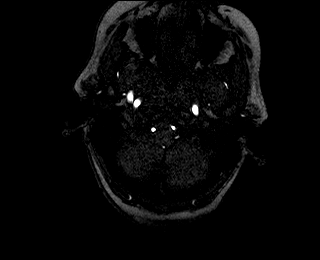
[im 91/91]
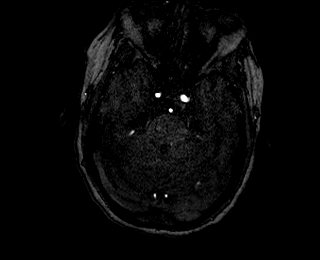

[Series 15: tof_fl2d_tra_mip_tra · axial · 192.0mm · 0.78mm/px · 1 of 1 slices shown]
[im 1/1]
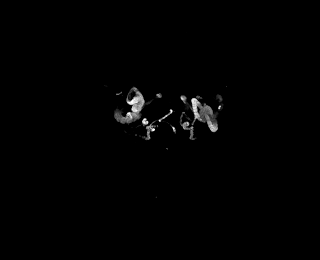

[Series 16: tof_fl2d_tra_mip_radials · axial · 0.78mm/px · 1 of 3 slices shown]
[im 1/3]
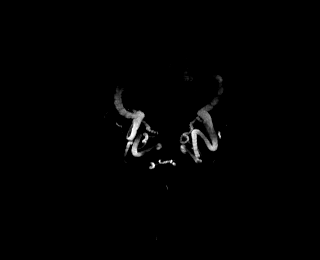

[Series 17: tof_fl3d_tra_iso · axial · 0.6mm · 0.52mm/px · z∈[-233,-63]mm · 22 of 298 slices shown (2 of 2)]
[im 1/298]
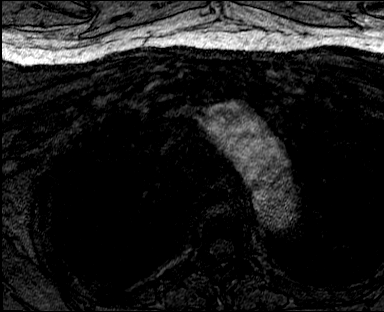
[im 12/298]
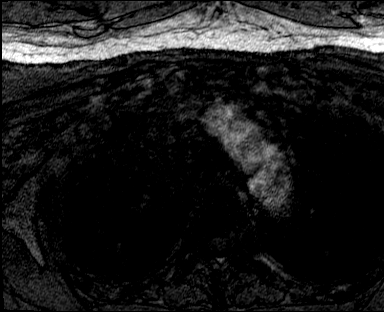
[im 23/298]
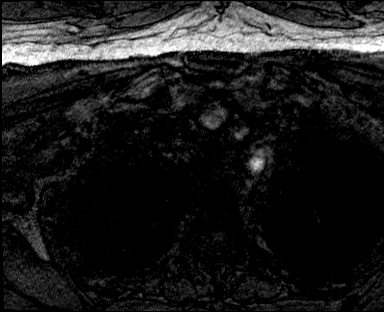
[im 35/298]
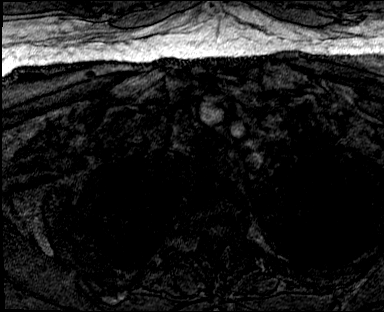
[im 46/298]
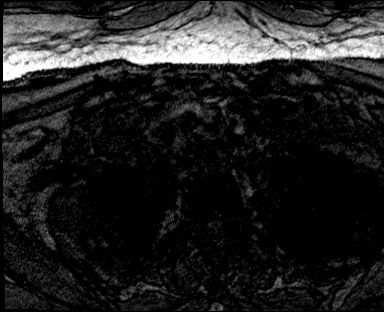
[im 58/298]
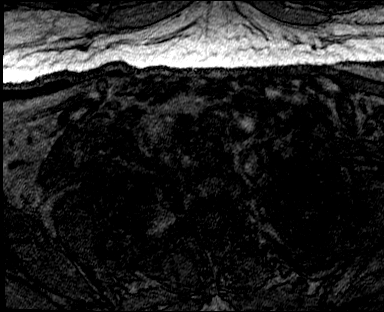
[im 69/298]
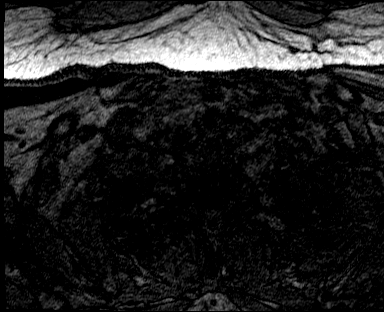
[im 80/298]
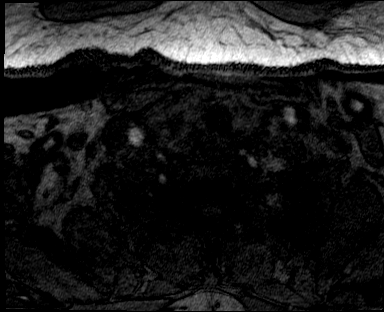
[im 92/298]
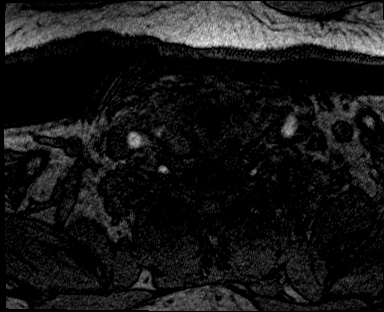
[im 103/298]
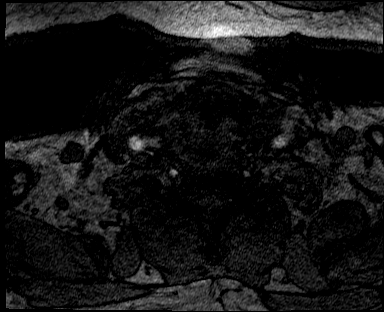
[im 115/298]
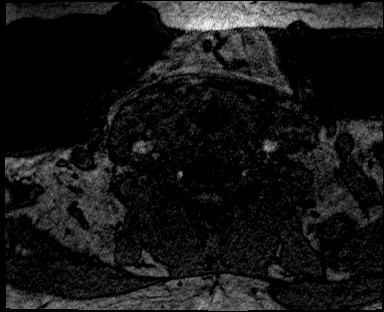
[im 126/298]
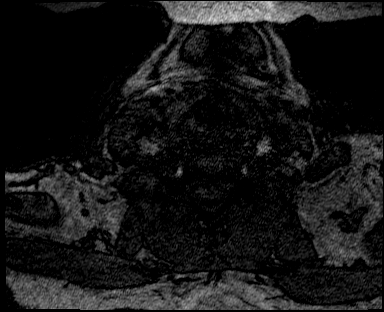
[im 138/298]
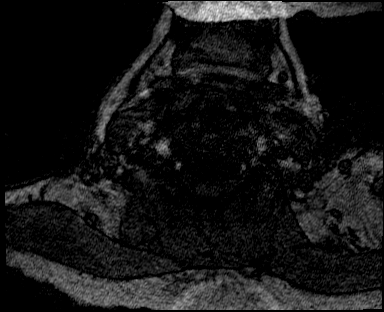
[im 149/298]
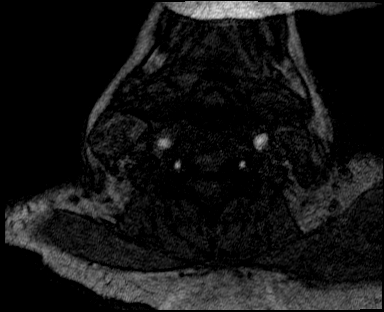
[im 160/298]
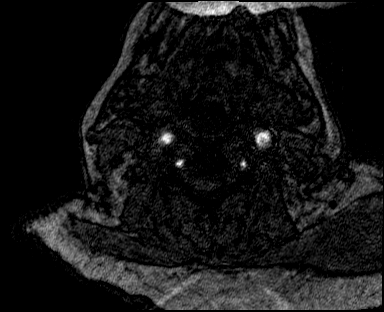
[im 172/298]
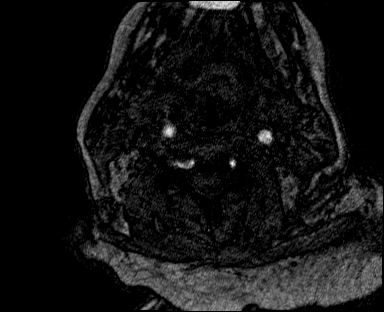
[im 183/298]
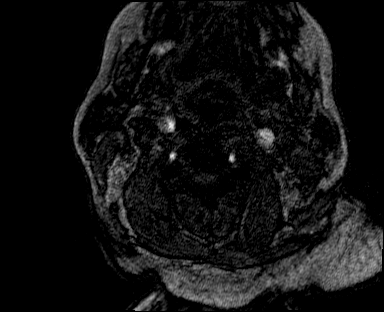
[im 195/298]
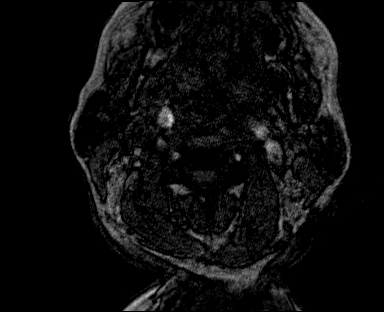
[im 206/298]
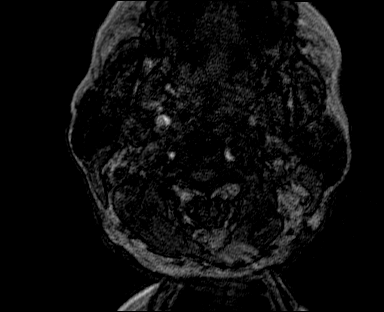
[im 218/298]
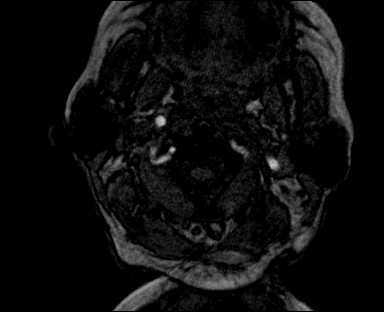
[im 240/298]
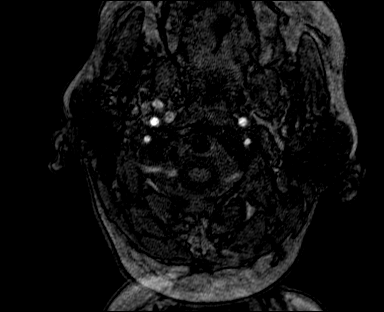
[im 286/298]
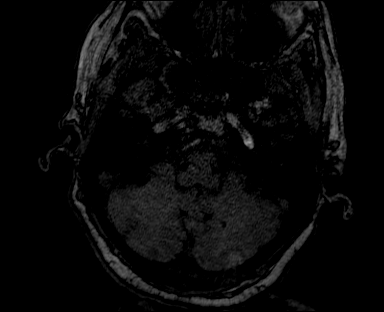

[43 of 48 positions shown; findings below may reference images not displayed]

FINDINGS: 2D and 3D time-of-flight MRA images of the neck.

Antegrade flow in bilateral cervical carotid and vertebral arteries
to the skull base.

Three vessel arch configuration.

Mildly tortuous bilateral CCA is. Carotid bifurcations appear patent
and within normal limits, without evidence of significant stenosis.
Tortuous right ICA distal to the bulb.

Codominant vertebral arteries appear patent to the skull base with
V2 and V3 tortuosity but no evidence of stenosis.
IMPRESSION: Negative noncontrast neck MRA.

## 2020-08-17 IMAGING — MR MR MRA HEAD W/O CM
1 series · 19 of 48 positions shown · non-contrast
Comparison: Brain MRI yesterday.  Neck MRA today.

CLINICAL DATA: 64-year-old female with recent delirium common
cephalopathy. Small acute white matter infarct in the anterior left
centrum semiovale on MRI yesterday.

EXAM:
MRA HEAD WITHOUT CONTRAST
TECHNIQUE: Angiographic images of the Circle of Willis were obtained using MRA
technique without intravenous contrast.

[Series 5: 3d cow · axial · 0.5mm · 0.41mm/px · z∈[-86,-4]mm · 19 of 172 slices shown]
[im 1/172]
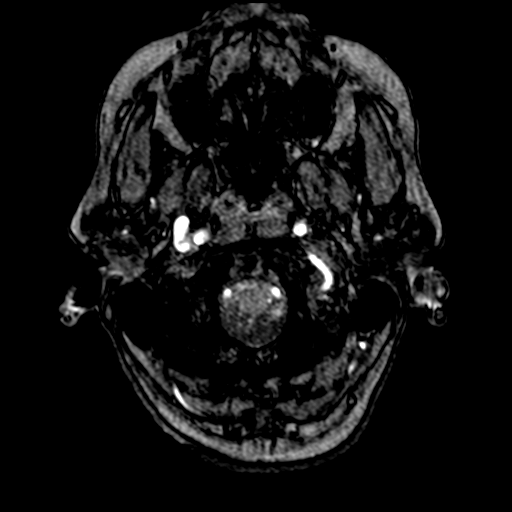
[im 4/172]
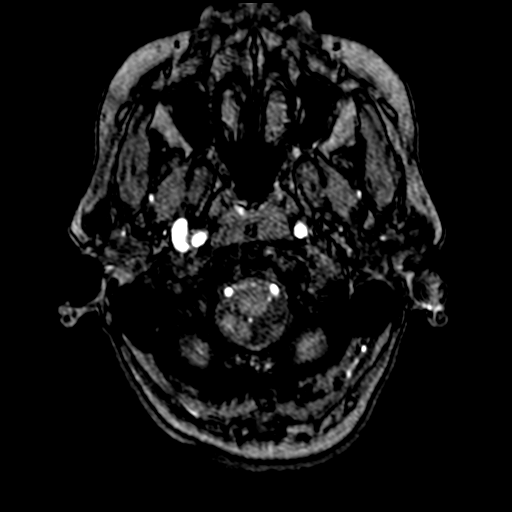
[im 8/172]
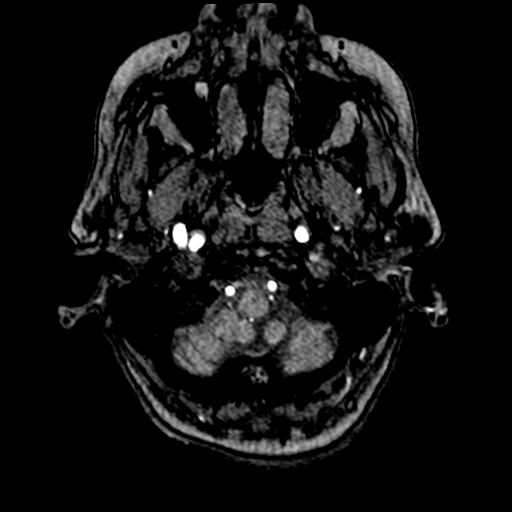
[im 11/172]
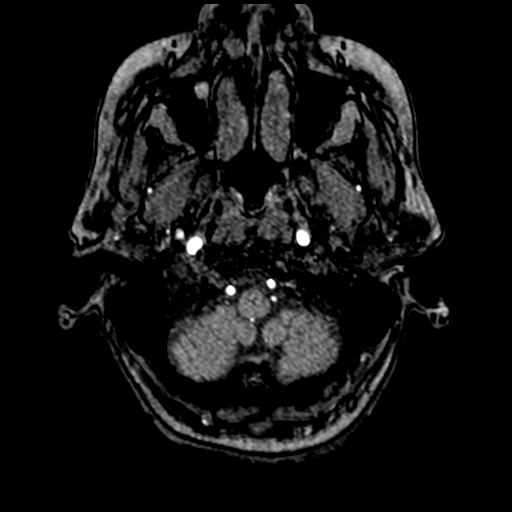
[im 15/172]
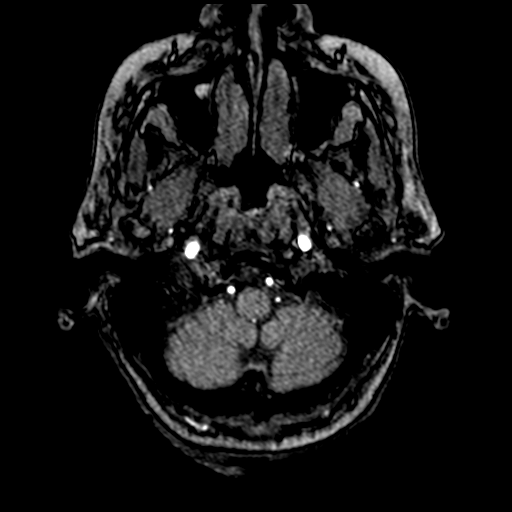
[im 19/172]
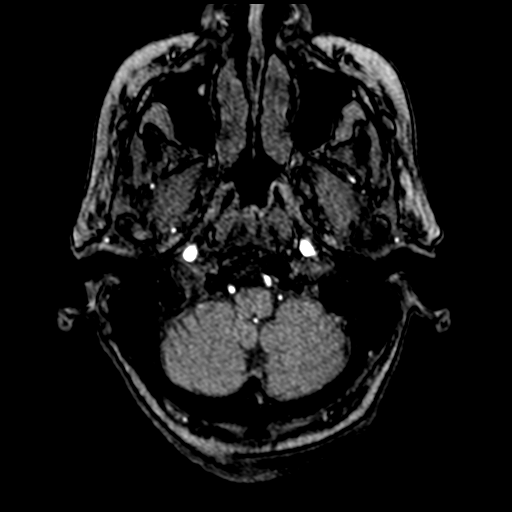
[im 22/172]
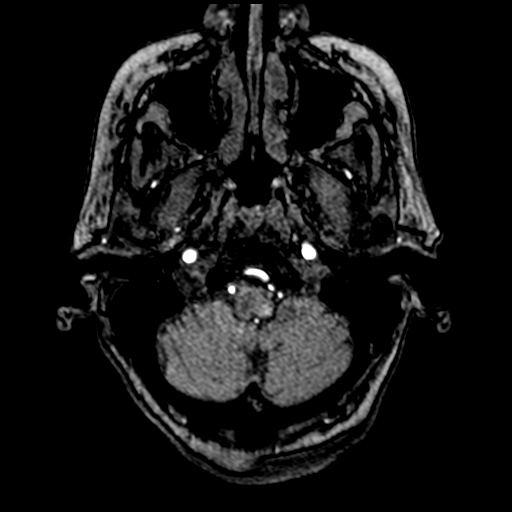
[im 26/172]
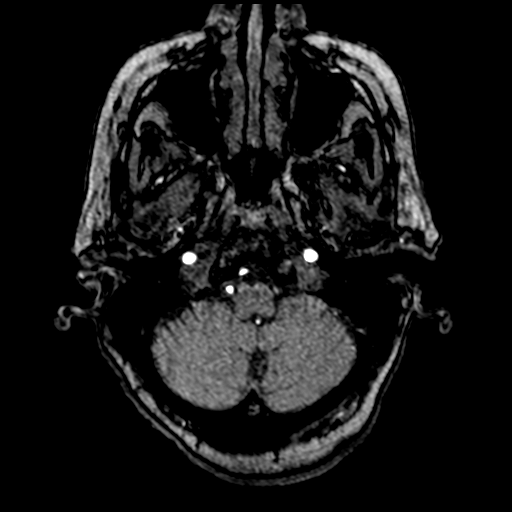
[im 30/172]
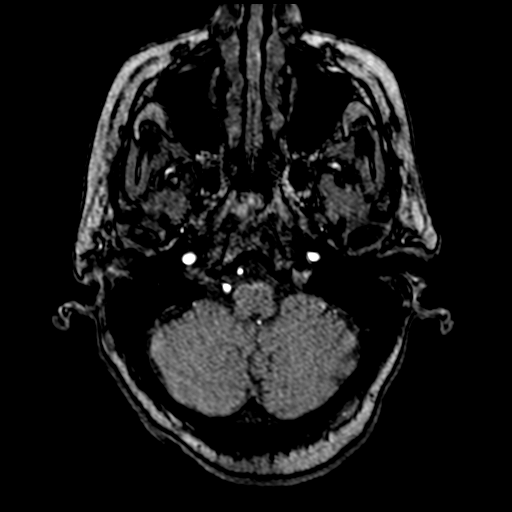
[im 33/172]
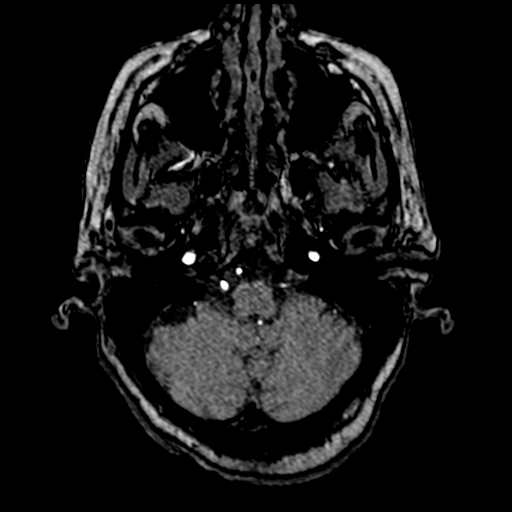
[im 37/172]
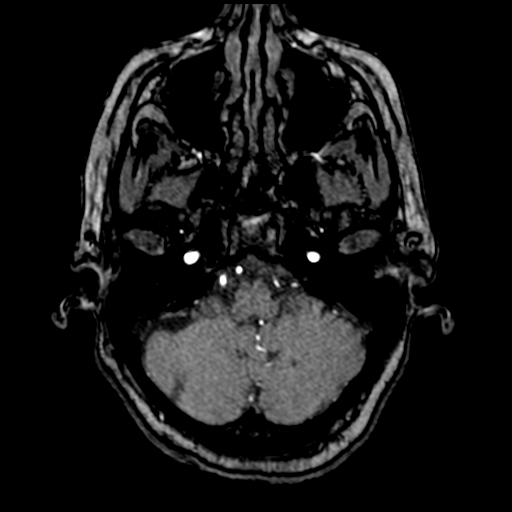
[im 55/172]
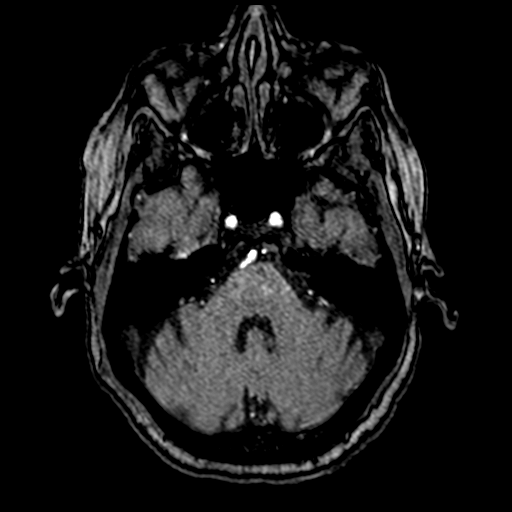
[im 77/172]
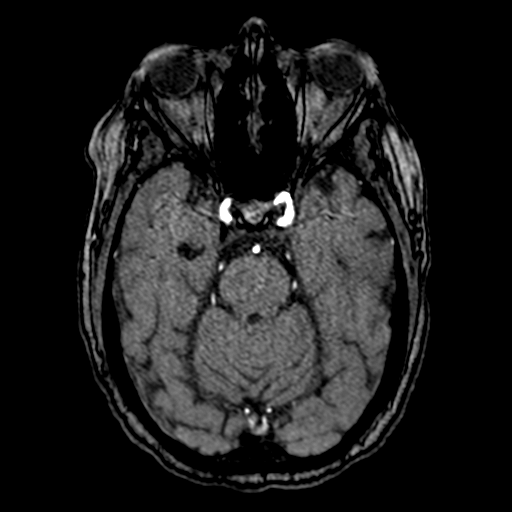
[im 88/172]
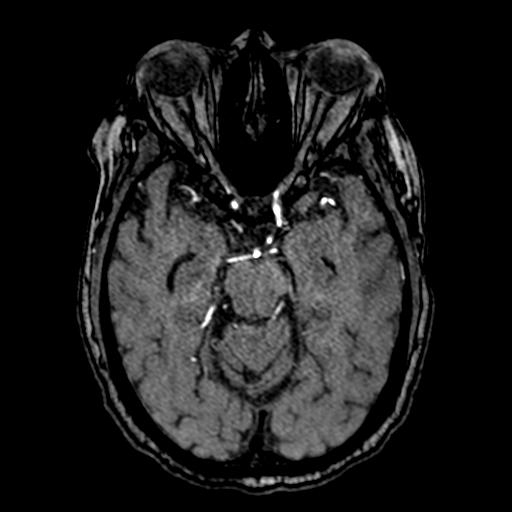
[im 99/172]
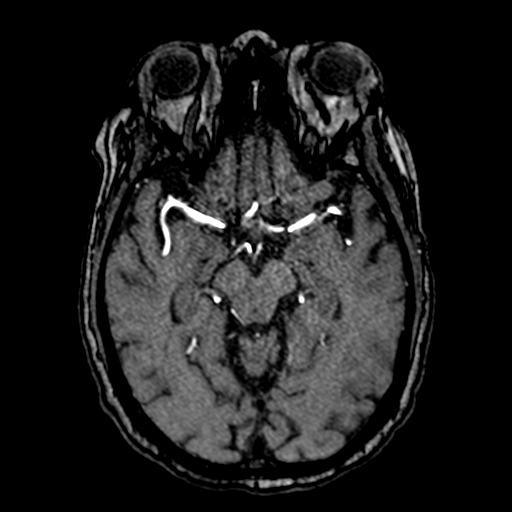
[im 121/172]
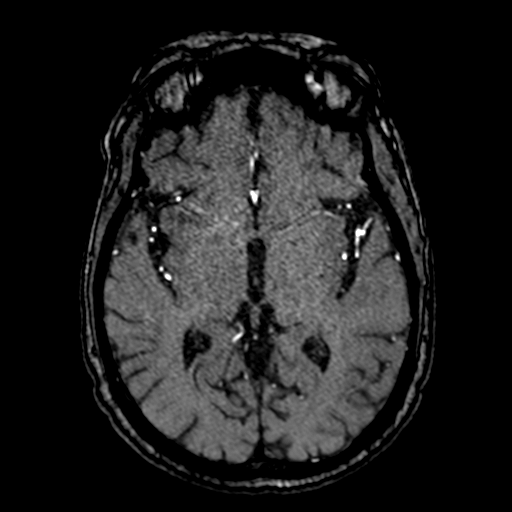
[im 142/172]
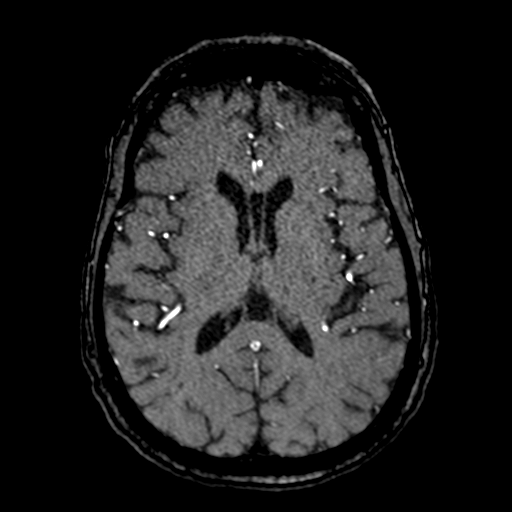
[im 146/172]
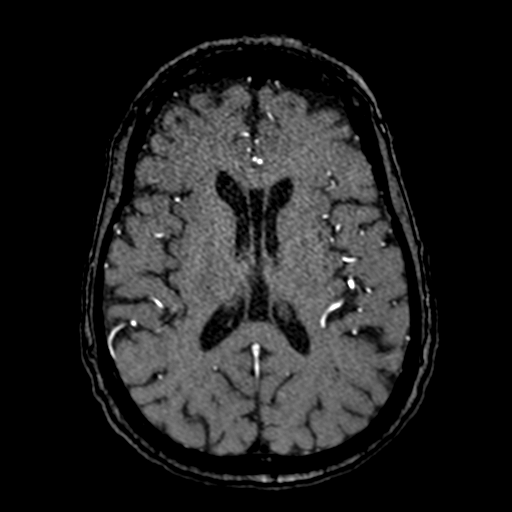
[im 164/172]
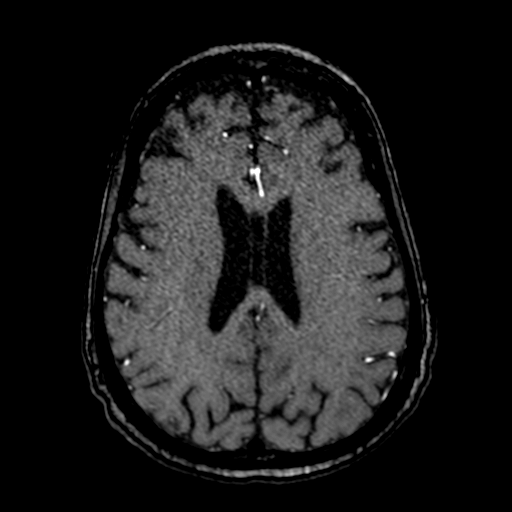

[19 of 48 positions shown; findings below may reference images not displayed]

FINDINGS: Antegrade flow in the posterior circulation with codominant distal
vertebral arteries. No distal vertebral stenosis. Normal left PICA
origin. Dominant appearing right AICA is patent. Patent
vertebrobasilar junction, basilar artery, bilateral AICA and SCA
origins. Mild basilar tortuosity and irregularity without
significant stenosis. Fetal type bilateral PCA origins, both P1
segments are present and tortuous.

Mild bilateral PCA P2 segment irregularity. No significant PCA
stenosis.

Antegrade flow in both ICA siphons. Tortuous right ICA below the
skull base.

Evidence of significant bilateral siphon atherosclerosis from the
distal cavernous through the supraclinoid segments. Moderate left
siphon stenosis at the anterior genu and the supraclinoid segment
distal to the ophthalmic artery. Mild to moderate similar right ICA
siphon stenosis.

Ophthalmic artery and posterior communicating artery origins are
normal. Patent carotid termini. Patent MCA and ACA origins. Mild
stenosis at both ACA origins greater on the left. Diminutive or
absent anterior communicating artery. Bilateral ACA branches are
within normal limits. Both MCA M1 segments and MCA bifurcations are
patent without stenosis. Visible bilateral MCA branches are within
normal limits.
IMPRESSION: 1. No large vessel occlusion.
2. Evidence of advanced atherosclerosis of both ICA siphons, with up
to moderate supraclinoid ICA stenosis greater on the left.
3. Only mild stenoses elsewhere in the anterior and posterior
circulation.

## 2020-08-17 MED ORDER — SORBITOL 70 % SOLN
30.0000 mL | Freq: Once | Status: AC
  Administered 2020-08-17: 30 mL via ORAL
  Filled 2020-08-17: qty 30

## 2020-08-17 NOTE — Progress Notes (Signed)
Dr. Myna Hidalgo notified via Amion of patient's MRI of the brain findings which includes small acute/subacute infract of the right centrum semiovale. MD came to bedside to see patient and orders received in Epic. Will continue to monitor.   Seynabou Fults,RN.

## 2020-08-17 NOTE — Progress Notes (Incomplete)
Dr. Myna Hidalgo informed via [place] that patient may need to be transferred to a Neuro floor as patient require NIHSS assessment every 4 hours per order. MD advised that NIHSS orders have been cancelled and that Neurology is consulted and will recommend further neuro assessment as necessary. Will continue to monitor.   Samuela Fokuo,RN.

## 2020-08-17 NOTE — Progress Notes (Signed)
Patient given warm SSE with Sorbitol this afternoon.  Awaiting results.  KUB reveals compact stool in colon.

## 2020-08-17 NOTE — Progress Notes (Signed)
Nephrology Follow-Up Consult note   Assessment/Recommendations: Christina Nunez is a/an 64 y.o. female with a past medical history significant for depression/anxiety, HTN, dementia, CABG, CKD, admitted for seere HTN, confusion, aki, and stroke.     Non-Oliguric AKI on CKD: Presumptive baseline CKD ~GFR 20 per family. Crt 3.7 on arrival and rose to 4.8. AKI related to overcorrection of HTN outpt severe HTN.  -Crt stabilizing at 4.8 today and UOP improving -Appars euvolemic -BP optimized -Continue to monitor daily Cr, Dose meds for GFR -Monitor Daily I/Os, Daily weight  -Maintain MAP>65 for optimal renal perfusion.  -Avoid nephrotoxic medications including NSAIDs and Vanc/Zosyn combo -Family records indicate prio baseline of 2.5  Hypertension: elevated on admission no much improved. Continue current medications  Stroke: found on MRI. Neuro following appreciate help  Anemia 2/2 CKD: no role for ESA at this time. CTM  CAD: H/o CABG. On plavix and lipitor   Recommendations conveyed to primary service.    [facility] 08/17/2020 11:22 AM  ___________________________________________________________  CC: AKI on CKD  Interval History/Subjective: MRI w/ storke. No other changes. BP controlled. UOP improving.   Medications:  Current Facility-Administered Medications  Medication Dose Route Frequency Provider Last Rate Last Admin  .  stroke: mapping our early stages of recovery book   Does not apply Once Opyd, Ilene Qua, MD      . acetaminophen (TYLENOL) tablet 325 mg  325 mg Oral Q6H PRN Cox, Amy N, DO   325 mg at 08/15/20 1557   Or  . acetaminophen (TYLENOL) suppository 325 mg  325 mg Rectal Q6H PRN Cox, Amy N, DO      . amLODipine (NORVASC) tablet 2.5 mg  2.5 mg Oral BID Roney Jaffe, MD   2.5 mg at 08/16/20 2135  . aspirin EC tablet 81 mg  81 mg Oral Daily Aline August, MD   81 mg at 08/16/20 0848  . atorvastatin (LIPITOR) tablet 40 mg  40 mg  Oral Daily Cox, Amy N, DO   40 mg at 08/16/20 0848  . carvedilol (COREG) tablet 6.25 mg  6.25 mg Oral BID WC Roney Jaffe, MD   6.25 mg at 08/17/20 0811  . Chlorhexidine Gluconate Cloth 2 % PADS 6 each  6 each Topical Daily Cox, Amy N, DO   6 each at 08/16/20 2139  . clopidogrel (PLAVIX) tablet 75 mg  75 mg Oral Daily Aline August, MD   75 mg at 08/16/20 0848  . COVID-19 mRNA vaccine (Moderna) injection 0.5 mL  0.5 mL Intramuscular ONCE-1600 Regalado, Belkys A, MD      . FLUoxetine (PROZAC) capsule 80 mg  80 mg Oral Daily Cox, Amy N, DO   80 mg at 08/16/20 0848  . heparin injection 5,000 Units  5,000 Units Subcutaneous Q8H Cox, Amy N, DO   5,000 Units at 08/17/20 0608  . melatonin tablet 3 mg  3 mg Oral QHS Cox, Amy N, DO   3 mg at 08/16/20 2135  . ondansetron (ZOFRAN) tablet 4 mg  4 mg Oral Q6H PRN Cox, Amy N, DO       Or  . ondansetron (ZOFRAN) injection 4 mg  4 mg Intravenous Q6H PRN Cox, Amy N, DO      . oxyCODONE (Oxy IR/ROXICODONE) immediate release tablet 5 mg  5 mg Oral Q6H PRN Aline August, MD   5 mg at 08/16/20 2135  . pantoprazole (PROTONIX) EC tablet 40 mg  40 mg Oral Daily Cox, Amy N,  DO   40 mg at 08/16/20 0849      Review of Systems: 10 systems reviewed and negative except per interval history/subjective  Physical Exam: Vitals:   08/17/20 0444 08/17/20 0938  BP: 139/83 140/76  Pulse: 72 69  Resp: 18 16  Temp: 98.4 F (36.9 C) 98.7 F (37.1 C)  SpO2: 96% 98%   Total I/O In: 340 [P.O.:240; Other:100] Out: 0   Intake/Output Summary (Last 24 hours) at 08/17/2020 1122 Last data filed at 08/17/2020 0900 Gross per 24 hour  Intake 700 ml  Output 920 ml  Net -220 ml   Constitutional: well-appearing, no acute distress ENMT: ears and nose without scars or lesions, MMM CV: normal rate, no edema Respiratory: bilateral chest rise, normal work of breathing Gastrointestinal: soft, non-tender, no palpable masses or hernias Skin: no visible lesions or  rashes   Test Results I personally reviewed new and old clinical labs and radiology tests Lab Results  Component Value Date   NA 135 08/17/2020   K 4.6 08/17/2020   CL 104 08/17/2020   CO2 21 (L) 08/17/2020   BUN 67 (H) 08/17/2020   CREATININE 4.79 (H) 08/17/2020   CALCIUM 8.7 (L) 08/17/2020   ALBUMIN 3.2 (L) 08/15/2020   PHOS 6.0 (H) 08/15/2020

## 2020-08-17 NOTE — Progress Notes (Addendum)
STROKE TEAM PROGRESS NOTE   INTERVAL HISTORY No family at bedside.  Patient lying in bed, awake alert orientated.  However she complains of abdominal pain more on the left lower quadrant, during encounter, she had to go to bathroom to have bowel movement.  Per Dr. Tyrell Antonio doctors shaking her., patient has severe constipation and currently on bowel regimen. She had no difficulty with walking to bathroom on my observation.  Vitals:   08/16/20 1352 08/16/20 1751 08/16/20 2051 08/17/20 0444  BP: 138/84 (!) 149/83 (!) 149/85 139/83  Pulse: 73 71 75 72  Resp:  14 16 18   Temp: 98.2 F (36.8 C) 99.5 F (37.5 C) 99.2 F (37.3 C) 98.4 F (36.9 C)  TempSrc: Oral Oral Oral Oral  SpO2: 98% 100% 97% 96%  Weight:      Height:       CBC:  Recent Labs  Lab 08/13/20 1618 08/14/20 0719 08/15/20 0239  WBC 8.0 7.0 9.1  NEUTROABS 4.3  --  6.4  HGB 10.5* 9.4* 9.4*  HCT 32.8* 29.3* 29.2*  MCV 89.6 91.3 89.8  PLT 167 141* 182*   Basic Metabolic Panel:  Recent Labs  Lab 08/15/20 0239 08/16/20 0014 08/17/20 0400  NA 137 134* 135  K 4.7 4.7 4.6  CL 108 106 104  CO2 18* 16* 21*  GLUCOSE 115* 121* 109*  BUN 67* 67* 67*  CREATININE 4.40* 4.78* 4.79*  CALCIUM 8.7* 8.4* 8.7*  MG 2.0  --   --   PHOS 6.0*  --   --    Lipid Panel:  Recent Labs  Lab 08/17/20 0400  CHOL 190  TRIG 123  HDL 37*  CHOLHDL 5.1  VLDL 25  LDLCALC 128*   HgbA1c:  Recent Labs  Lab 08/13/20 1618  HGBA1C 6.3*   Urine Drug Screen: No results for input(s): LABOPIA, COCAINSCRNUR, LABBENZ, AMPHETMU, THCU, LABBARB in the last 168 hours.  Alcohol Level No results for input(s): ETH in the last 168 hours.  IMAGING past 24 hours MR ANGIO HEAD WO CONTRAST  Result Date: 08/17/2020 CLINICAL DATA:  64 year old female with recent delirium common cephalopathy. Small acute white matter infarct in the anterior left centrum semiovale on MRI yesterday. EXAM: MRA HEAD WITHOUT CONTRAST TECHNIQUE: Angiographic images of the  Circle of Willis were obtained using MRA technique without intravenous contrast. COMPARISON:  Brain MRI yesterday.  Neck MRA today. FINDINGS: Antegrade flow in the posterior circulation with codominant distal vertebral arteries. No distal vertebral stenosis. Normal left PICA origin. Dominant appearing right AICA is patent. Patent vertebrobasilar junction, basilar artery, bilateral AICA and SCA origins. Mild basilar tortuosity and irregularity without significant stenosis. Fetal type bilateral PCA origins, both P1 segments are present and tortuous. Mild bilateral PCA P2 segment irregularity. No significant PCA stenosis. Antegrade flow in both ICA siphons. Tortuous right ICA below the skull base. Evidence of significant bilateral siphon atherosclerosis from the distal cavernous through the supraclinoid segments. Moderate left siphon stenosis at the anterior genu and the supraclinoid segment distal to the ophthalmic artery. Mild to moderate similar right ICA siphon stenosis. Ophthalmic artery and posterior communicating artery origins are normal. Patent carotid termini. Patent MCA and ACA origins. Mild stenosis at both ACA origins greater on the left. Diminutive or absent anterior communicating artery. Bilateral ACA branches are within normal limits. Both MCA M1 segments and MCA bifurcations are patent without stenosis. Visible bilateral MCA branches are within normal limits. IMPRESSION: 1. No large vessel occlusion. 2. Evidence of advanced atherosclerosis of both  ICA siphons, with up to moderate supraclinoid ICA stenosis greater on the left. 3. Only mild stenoses elsewhere in the anterior and posterior circulation. Electronically Signed   By: Genevie Ann M.D.   On: 08/17/2020 07:28   MR ANGIO NECK WO CONTRAST  Result Date: 08/17/2020 CLINICAL DATA:  64 year old female with recent delirium common cephalopathy. Small acute white matter infarct in the anterior left centrum semiovale on MRI yesterday. EXAM: MRA NECK  WITHOUT CONTRAST TECHNIQUE: Angiographic images of the neck were obtained using MRA technique without intravenous contrast. Carotid stenosis measurements (when applicable) are obtained utilizing NASCET criteria, using the distal internal carotid diameter as the denominator. COMPARISON:  None. FINDINGS: 2D and 3D time-of-flight MRA images of the neck. Antegrade flow in bilateral cervical carotid and vertebral arteries to the skull base. Three vessel arch configuration. Mildly tortuous bilateral CCA is. Carotid bifurcations appear patent and within normal limits, without evidence of significant stenosis. Tortuous right ICA distal to the bulb. Codominant vertebral arteries appear patent to the skull base with V2 and V3 tortuosity but no evidence of stenosis. IMPRESSION: Negative noncontrast neck MRA. Electronically Signed   By: Genevie Ann M.D.   On: 08/17/2020 07:18   MR BRAIN WO CONTRAST  Result Date: 08/16/2020 CLINICAL DATA:  Delirium. EXAM: MRI HEAD WITHOUT CONTRAST TECHNIQUE: Multiplanar, multiecho pulse sequences of the brain and surrounding structures were obtained without intravenous contrast. COMPARISON:  Head CT August 13, 2020. FINDINGS: Brain: Linear focus of restricted diffusion within the right centrum semiovale, consistent with acute/subacute infarct. Remote lacunar infarcts in the left cerebellar hemisphere, left basal ganglia region, left thalamus and bilateral centrum semiovale and corona radiata. Scattered foci of T2 hyperintensity are seen within the white matter of the cerebral hemispheres and within the pons, nonspecific, most likely related to chronic microangiopathic changes. No hemorrhage, hydrocephalus, extra-axial collection or mass lesion. Vascular: Normal flow voids. Skull and upper cervical spine: Normal marrow signal. Sinuses/Orbits: Negative. IMPRESSION: 1. Linear focus of restricted diffusion within the right centrum semiovale, consistent with small acute/subacute infarct. 2. Remote  lacunar infarcts in the left cerebellar hemisphere, left basal ganglia region, left thalamus and bilateral centrum semiovale and corona radiata. 3. Mild chronic microangiopathic changes. Electronically Signed   By: Pedro Earls M.D.   On: 08/16/2020 20:40    PHYSICAL EXAM  Temp:  [98.4 F (36.9 C)-99.2 F (37.3 C)] 99 F (37.2 C) (12/31 1733) Pulse Rate:  [69-75] 70 (12/31 1733) Resp:  [16-18] 17 (12/31 1733) BP: (139-149)/(76-85) 142/79 (12/31 1733) SpO2:  [96 %-100 %] 100 % (12/31 1733)  General - Well nourished, well developed, in no apparent distress. Abdominal pain more on the left lower quadrant, during encounter, she had to go to bathroom to have bowel movement.   Ophthalmologic - fundi not visualized due to noncooperation.  Cardiovascular - Regular rhythm and rate.  Mental Status -  Level of arousal and orientation to time, place, and person were intact. Language including expression, naming, repetition, comprehension was assessed and found intact.  Cranial Nerves II - XII - II - Visual field intact OU. III, IV, VI - Extraocular movements intact. V - Facial sensation intact bilaterally. VII - Facial movement intact bilaterally. VIII - Hearing & vestibular intact bilaterally. X - Palate elevates symmetrically. XI - Chin turning & shoulder shrug intact bilaterally. XII - Tongue protrusion intact.  Motor Strength - The patient's strength was normal in all extremities and pronator drift was absent.  Bulk was normal and fasciculations were  absent.   Motor Tone - Muscle tone was assessed at the neck and appendages and was normal.  Reflexes - The patient's reflexes were symmetrical in all extremities and she had no pathological reflexes.  Sensory - Light touch, temperature/pinprick were assessed and were symmetrical.    Coordination - The patient had normal movements in the hands with no ataxia or dysmetria.  Tremor was absent.  Gait and Station - on  walking to bathroom, patient has normal gait, no fall tendency.   ASSESSMENT/PLAN [name] is a 64 y.o. female with history of hypertension, dementia, depression/anxiety, possible coronary artery disease status post CABG admitted to the hospital 08/14/2020 for evaluation and management of weakness and imbalance since Oct when she moved to [facility], attributed to deranged renal function, hypertensive urgency (out of meds), and troponinemia. MRI of the brain noted a lacunar-looking infarct.   Stroke: acute R centrum semiovale lacunar infarct likely secondary to small vs large vessel disease source  CT head No acute abnormality. Small vessel disease.   MRI  R centrum semiovale infarct. Old L cerebellar, L basal ganglia, L thalamic, B centrum semiovale, B corona radiata infarcts. Small vessel disease.   MRA head & neck No LVO. B ICA siphon advanced atherosclerosis. L>R moderate supraclinoid ICA stenosis. Mild stenoses throughout.   LE Doppler  No DVT  2D Echo EF 50-55%. No source of embolus   LDL 128  HgbA1c 6.3  VTE prophylaxis - Heparin 5000 units sq tid   aspirin 81 mg daily and clopidogrel 75 mg daily prior to admission, now on aspirin 81 mg daily and clopidogrel 75 mg daily.   Therapy recommendations:  SNF  Disposition:  pending   Encephalopathy   [place] we[place] mental status as reported  Likely due to AKI on CKD, hypertensive urgency and baseline cognitive impairment  Not able to be explained by the left lacunar infarct  Continue treat for underlying condition  Hypertensive Urgency  BP as high as 218/209  Now Stable . Long-term BP goal normotensive  Hyperlipidemia  Home meds:  No statin  Now on lipitor 40  LDL 128, goal < 70  Continue statin at discharge  AKI on CKD  Pt came from [place] - medical record not available yet  Creatinine 3.72 ... -> 4.79  Nephrology on board  Other Stroke Risk Factors  Obesity, Body mass index is 31.89 kg/m., BMI >/= 30  associated with increased stroke risk, recommend weight loss, diet and exercise as appropriate   Coronary artery disease s/p CABG 2 yrs ago  Other Active Problems  Baseline memory deficit (1-2 yrs) - medical record identifiable from [place]  Anemia, Hgb 10.5->9.4, likely r/t CKD  GERD  Depression/Schizophrenia - medical record not yet available from [place]  Abdominal pain due to constipation - on bowel regimen  Hospital day # 3  Neurology will sign off. Please call with questions. Pt will follow up with stroke clinic NP at [facility] in about 4 weeks. Thanks for the consult.  Rosalin Hawking, MD PhD Stroke Neurology 08/17/2020 6:02 PM  To contact Stroke Continuity provider, please refer to [email or web address] After hours, contact General Neurology

## 2020-08-17 NOTE — Plan of Care (Signed)
  Problem: Education: Goal: Knowledge of General Education information will improve Description: Including pain rating scale, medication(s)/side effects and non-pharmacologic comfort measures Outcome: Progressing   Problem: Pain Managment: Goal: General experience of comfort will improve Outcome: Progressing   

## 2020-08-17 NOTE — Progress Notes (Addendum)
PROGRESS NOTE    Christina Nunez  ELF:[id number] DOB: 02/25/1956 DOA: 08/13/2020 PCP: Medicine, [facility]   Brief Narrative: 64 year old with past medical history questionable for hypertension, dementia possible prior CABG presented to the ED for generalized weakness and difficulty ambulating.  Patient was found to be mildly confused.  Blood pressure was 218/109.  Patient was admitted with hypertensive urgency and renal failure with a creatinine of 3.7.  Chest x-ray showed vascular congestion and possible interstitial edema.  Patient presented to the ED with flank pain, questionable urinary retention, bladder scan only showed 30 cc.    Assessment & Plan:   Principal Problem:   Hypertensive emergency Active Problems:   Essential hypertension   Memory deficit   AKI (acute kidney injury) ([place])   History of ischemic heart disease   Depression   Hypertensive urgency   Acute ischemic stroke ([place])  1-Hypertensive urgency: Blood pressure better controlled.  Continue with norvasc and coreg.   Right Centrum Semiovale infarct  Probably related to small vessel diseases.  On aspirin.  Follow neurology recommendation for anticoagulation.  MRA; no large vessel occlusion.  Bilateral ICA siphon advanced atherosclerosis left more than right moderate supraclinoid ICA stenosis.  LDL 128, HbA1c 6.3 Started on Plavix.  ECHO; normal EF, Diastolic dysfunction mitral regurgitation. No thrombus.   AKI, On CKD stage IV Sister will bring records. Prior records cr at 2.5 GFR 23. Cr 4 range.  Nephrology following.  Worsening renal function could be related to rapid correction of HTN,.  Stable. Cr 4.7  Weakness , dizziness; Confusion.  TSH and B 12 normal.  Plan to get MRI brain.  PT evaluation.   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   Lower extremity pain: Doppler 12/28 -negative  GERD;  Continue with  protonix  Depression/schizophrenia Continue with prozac.   Abdominal pain;  Renal US negative for hydronephrosis.  CT renal protocol, negative for intraabdominal pathology Check KUB. Might need to repeat sorbitol.   Questionable history of dementia; She will need evaluation with neurology outpatient.   Covid vaccine; will be given during admission; sister agree.   Estimated body mass index is 31.89 kg/m as calculated from the following:   Height as of this encounter: 5\' 3"  (1.6 m).   Weight as of this encounter: 81.7 kg.   DVT prophylaxis: Heparin  Code Status: Full code Family Communication: care discussed with sister per patient request.  Disposition Plan:  Status is: Inpatient  Remains inpatient appropriate because:Ongoing diagnostic testing needed not appropriate for outpatient work up   Dispo: The patient is from: Home              Anticipated d/c is to: to be determine              Anticipated d/c date is: 2 days              Patient currently is not medically stable to d/c.        Consultants:   Nephrology   Procedures:   Renal US, negative for hydroneprhosis   Antimicrobials:    Subjective: She is alert, oriented to person and place. She was able to ambulate with assistance of PT today   Objective: Vitals:   08/16/20 1751 08/16/20 2051 08/17/20 0444 08/17/20 0938  BP: (!) 149/83 (!) 149/85 139/83 140/76  Pulse: 71 75 72 69  Resp: 14 16 18 16   Temp: 99.5 F (37.5 C) 99.2 F (  37.3 C) 98.4 F (36.9 C) 98.7 F (37.1 C)  TempSrc: Oral Oral Oral Oral  SpO2: 100% 97% 96% 98%  Weight:      Height:        Intake/Output Summary (Last 24 hours) at 08/17/2020 1136 Last data filed at 08/17/2020 0900 Gross per 24 hour  Intake 700 ml  Output 920 ml  Net -220 ml   Filed Weights   08/13/20 1508 08/15/20 2132  Weight: 81.6 kg 81.7 kg    Examination:  General exam: NAD Respiratory system: CTA Cardiovascular system: S 1, S 2  RRR Gastrointestinal system: BS present, soft, nt Central nervous system: alert Extremities: no edema  Data Reviewed: I have personally reviewed following labs and imaging studies  CBC: Recent Labs  Lab 08/13/20 1618 08/14/20 0719 08/15/20 0239  WBC 8.0 7.0 9.1  NEUTROABS 4.3  --  6.4  HGB 10.5* 9.4* 9.4*  HCT 32.8* 29.3* 29.2*  MCV 89.6 91.3 89.8  PLT 167 141* 854*   Basic Metabolic Panel: Recent Labs  Lab 08/13/20 1618 08/14/20 0719 08/15/20 0239 08/16/20 0014 08/17/20 0400  NA 139 140 137 134* 135  K 4.4 4.7 4.7 4.7 4.6  CL 109 110 108 106 104  CO2 22 20* 18* 16* 21*  GLUCOSE 118* 119* 115* 121* 109*  BUN 65* 62* 67* 67* 67*  CREATININE 3.72* 3.85* 4.40* 4.78* 4.79*  CALCIUM 9.2 9.2 8.7* 8.4* 8.7*  MG  --   --  2.0  --   --   PHOS  --   --  6.0*  --   --    GFR: Estimated Creatinine Clearance: 12.2 mL/min (A) (by C-G formula based on SCr of 4.79 mg/dL (H)). Liver Function Tests: Recent Labs  Lab 08/13/20 1618 08/15/20 0239  AST 14* 9*  ALT 15 11  ALKPHOS 68 51  BILITOT 0.4 0.5  PROT 7.4 6.5  ALBUMIN 3.8 3.2*   No results for input(s): LIPASE, AMYLASE in the last 168 hours. No results for input(s): AMMONIA in the last 168 hours. Coagulation Profile: No results for input(s): INR, PROTIME in the last 168 hours. Cardiac Enzymes: No results for input(s): CKTOTAL, CKMB, CKMBINDEX, TROPONINI in the last 168 hours. BNP (last 3 results) No results for input(s): PROBNP in the last 8760 hours. HbA1C: No results for input(s): HGBA1C in the last 72 hours. CBG: Recent Labs  Lab 08/14/20 1151 08/14/20 1625 08/14/20 2040 08/15/20 0001 08/15/20 0807  GLUCAP 127* 118* 176* 113* 103*   Lipid Profile: Recent Labs    08/17/20 0400  CHOL 190  HDL 37*  LDLCALC 128*  TRIG 123  CHOLHDL 5.1   Thyroid Function Tests: No results for input(s): TSH, T4TOTAL, FREET4, T3FREE, THYROIDAB in the last 72 hours. Anemia Panel: No results for input(s): VITAMINB12,  FOLATE, FERRITIN, TIBC, IRON, RETICCTPCT in the last 72 hours. Sepsis Labs: No results for input(s): PROCALCITON, LATICACIDVEN in the last 168 hours.  Recent Results (from the past 240 hour(s))  SARS CORONAVIRUS 2 (TAT 6-24 HRS) Nasopharyngeal Nasopharyngeal Swab     Status: None   Collection Time: 08/13/20  8:22 PM   Specimen: Nasopharyngeal Swab  Result Value Ref Range Status   SARS Coronavirus 2 NEGATIVE NEGATIVE Final    Comment: (NOTE) SARS-CoV-2 target nucleic acids are NOT DETECTED.  The SARS-CoV-2 RNA is generally detectable in upper and lower respiratory specimens during the acute phase of infection. Negative results do not preclude SARS-CoV-2 infection, do not rule out co-infections with other  pathogens, and should not be used as the sole basis for treatment or other patient management decisions. Negative results must be combined with clinical observations, patient history, and epidemiological information. The expected result is Negative.  Fact Sheet for Patients: SugarRoll.be  Fact Sheet for Healthcare Providers: https://www.woods-mathews.com/  This test is not yet approved or cleared by the [place] FDA and  has been authorized for detection and/or diagnosis of SARS-CoV-2 by FDA under an Emergency Use Authorization (EUA). This EUA will remain  in effect (meaning this test can be used) for the duration of the COVID-19 declaration under Se ction 564(b)(1) of the Act, 21 U.S.C. section 360bbb-3(b)(1), unless the authorization is terminated or revoked sooner.  Performed at [facility], [place] [address], [place], [place] [address]   MRSA PCR Screening     Status: None   Collection Time: 08/14/20  3:38 AM   Specimen: Nasopharyngeal  Result Value Ref Range Status   MRSA by PCR NEGATIVE NEGATIVE Final    Comment:        The GeneXpert MRSA Assay (FDA approved for NASAL specimens only), is one component of  a comprehensive MRSA colonization surveillance program. It is not intended to diagnose MRSA infection nor to guide or monitor treatment for MRSA infections. Performed at [facility], [place] [address] [place], [place] [address]          Radiology Studies: MR ANGIO HEAD WO CONTRAST  Result Date: 08/17/2020 CLINICAL DATA:  64 year old female with recent delirium common cephalopathy. Small acute white matter infarct in the anterior left centrum semiovale on MRI yesterday. EXAM: MRA HEAD WITHOUT CONTRAST TECHNIQUE: Angiographic images of the Circle of Willis were obtained using MRA technique without intravenous contrast. COMPARISON:  Brain MRI yesterday.  Neck MRA today. FINDINGS: Antegrade flow in the posterior circulation with codominant distal vertebral arteries. No distal vertebral stenosis. Normal left PICA origin. Dominant appearing right AICA is patent. Patent vertebrobasilar junction, basilar artery, bilateral AICA and SCA origins. Mild basilar tortuosity and irregularity without significant stenosis. Fetal type bilateral PCA origins, both P1 segments are present and tortuous. Mild bilateral PCA P2 segment irregularity. No significant PCA stenosis. Antegrade flow in both ICA siphons. Tortuous right ICA below the skull base. Evidence of significant bilateral siphon atherosclerosis from the distal cavernous through the supraclinoid segments. Moderate left siphon stenosis at the anterior genu and the supraclinoid segment distal to the ophthalmic artery. Mild to moderate similar right ICA siphon stenosis. Ophthalmic artery and posterior communicating artery origins are normal. Patent carotid termini. Patent MCA and ACA origins. Mild stenosis at both ACA origins greater on the left. Diminutive or absent anterior communicating artery. Bilateral ACA branches are within normal limits. Both MCA M1 segments and MCA bifurcations are patent without stenosis. Visible bilateral MCA  branches are within normal limits. IMPRESSION: 1. No large vessel occlusion. 2. Evidence of advanced atherosclerosis of both ICA siphons, with up to moderate supraclinoid ICA stenosis greater on the left. 3. Only mild stenoses elsewhere in the anterior and posterior circulation. Electronically Signed   By: Genevie Ann M.D.   On: 08/17/2020 07:28   MR ANGIO NECK WO CONTRAST  Result Date: 08/17/2020 CLINICAL DATA:  64 year old female with recent delirium common cephalopathy. Small acute white matter infarct in the anterior left centrum semiovale on MRI yesterday. EXAM: MRA NECK WITHOUT CONTRAST TECHNIQUE: Angiographic images of the neck were obtained using MRA technique without intravenous contrast. Carotid stenosis measurements (when applicable) are obtained utilizing NASCET criteria,  using the distal internal carotid diameter as the denominator. COMPARISON:  None. FINDINGS: 2D and 3D time-of-flight MRA images of the neck. Antegrade flow in bilateral cervical carotid and vertebral arteries to the skull base. Three vessel arch configuration. Mildly tortuous bilateral CCA is. Carotid bifurcations appear patent and within normal limits, without evidence of significant stenosis. Tortuous right ICA distal to the bulb. Codominant vertebral arteries appear patent to the skull base with V2 and V3 tortuosity but no evidence of stenosis. IMPRESSION: Negative noncontrast neck MRA. Electronically Signed   By: Genevie Ann M.D.   On: 08/17/2020 07:18   MR BRAIN WO CONTRAST  Result Date: 08/16/2020 CLINICAL DATA:  Delirium. EXAM: MRI HEAD WITHOUT CONTRAST TECHNIQUE: Multiplanar, multiecho pulse sequences of the brain and surrounding structures were obtained without intravenous contrast. COMPARISON:  Head CT August 13, 2020. FINDINGS: Brain: Linear focus of restricted diffusion within the right centrum semiovale, consistent with acute/subacute infarct. Remote lacunar infarcts in the left cerebellar hemisphere, left basal ganglia  region, left thalamus and bilateral centrum semiovale and corona radiata. Scattered foci of T2 hyperintensity are seen within the white matter of the cerebral hemispheres and within the pons, nonspecific, most likely related to chronic microangiopathic changes. No hemorrhage, hydrocephalus, extra-axial collection or mass lesion. Vascular: Normal flow voids. Skull and upper cervical spine: Normal marrow signal. Sinuses/Orbits: Negative. IMPRESSION: 1. Linear focus of restricted diffusion within the right centrum semiovale, consistent with small acute/subacute infarct. 2. Remote lacunar infarcts in the left cerebellar hemisphere, left basal ganglia region, left thalamus and bilateral centrum semiovale and corona radiata. 3. Mild chronic microangiopathic changes. Electronically Signed   By: Pedro Earls M.D.   On: 08/16/2020 20:40   US RENAL  Result Date: 08/15/2020 CLINICAL DATA:  Evaluate etiology of renal failure. EXAM: RENAL / URINARY TRACT ULTRASOUND COMPLETE COMPARISON:  08/13/2020 FINDINGS: Right Kidney: Renal measurements: 10.4 x 4.9 x 5.9 cm = volume: 158 mL. Echogenicity within normal limits. No mass or hydronephrosis visualized. Left Kidney: Renal measurements: 10.0 x 5.3 x 5.5 cm = volume: 152 mL. Echogenicity within normal limits. No mass or hydronephrosis visualized. Bladder: Appears normal for degree of bladder distention. Other: None. IMPRESSION: 1. Normal renal sonogram. Electronically Signed   By: Kerby Moors M.D.   On: 08/15/2020 16:51        Scheduled Meds: .  stroke: mapping our early stages of recovery book   Does not apply Once  . amLODipine  2.5 mg Oral BID  . aspirin EC  81 mg Oral Daily  . atorvastatin  40 mg Oral Daily  . carvedilol  6.25 mg Oral BID WC  . Chlorhexidine Gluconate Cloth  6 each Topical Daily  . clopidogrel  75 mg Oral Daily  . COVID-19 mRNA vaccine (Moderna)  0.5 mL Intramuscular ONCE-1600  . FLUoxetine  80 mg Oral Daily  . heparin  injection (subcutaneous)  5,000 Units Subcutaneous Q8H  . melatonin  3 mg Oral QHS  . pantoprazole  40 mg Oral Daily   Continuous Infusions:   LOS: 3 days    Time spent: 35 minutes    Arieon Corcoran A Lavada Langsam, MD [facility]   If 7PM-7AM, please contact night-coverage [email or web address]  08/17/2020, 11:36 AM

## 2020-08-17 NOTE — TOC Progression Note (Addendum)
Transition of Care [facility]) - Progression Note    Patient Details  Name: Shaasia Odle MRN: [id number] Date of Birth: [birth year]  Transition of Care [facility]) CM/SW Contact  Sharlet Salina Mila Homer, LCSW Phone Number: 08/17/2020, 2:43 PM  Clinical Narrative:  Ebony Hail, admissions liaison for [facility] and [place] contacted this morning regarding patient.  Per Ebony Hail, patient will dc to [facility] once auth received and she clarified that patient does not have to be vaccinated to admit to [facility], however she is still waiting for [place] to cancel their auth request.   Call made to [place], admissions Mudlogger at [facility] and request made to cancel auth request on patient. Per Loma Boston she will try to get this done today. 2:20 pm: text sent to [facility] to determine if auth had been cancelled. She replied that the phone just rings and the insurance may be closed for the holiday. 2:50 pm: Ebony Hail contacted and updated.  CSW will follow-up with Teena at [facility] on Monday.  3:27 pm: MD talked with CSW regarding SNF placement status. CSW explained that [place] must cancel their authorization request and facility admissions director had been contacted. tried to reach [facility] and phone just rang. Dr. Tyrell Antonio will update daughter.    Expected Discharge Plan: Skilled Nursing Facility Barriers to Discharge: Insurance Authorization  Expected Discharge Plan and Services Expected Discharge Plan: [place]   Discharge Planning Services: CM Consult   Living arrangements for the past 2 months: Single Family Home                                       Social Determinants of Health (SDOH) Interventions    Readmission Risk Interventions No flowsheet data found.

## 2020-08-17 NOTE — Progress Notes (Signed)
Physical Therapy Treatment Patient Details Name: Christina Nunez MRN: [id number] DOB: November 11, 1955 Today's Date: 08/17/2020    History of Present Illness 64 yo female presenting to ED with wekaness, imbalance, and right lower back/right sided abdominal pain. MRI on 12/30 showing right centrum semiovale, consistent with small acute/subacute infarct and lacunar infarcts in the left cerebellar hemisphere, left basal ganglia region, left thalamus and bilateral centrum semiovale and corona radiata. 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 Pt recently moved to [place] from [place]    PT Comments    Patient received in bed, very pleasant and cooperative. Much less ataxic today, and able to progress gait distance quite a bit today, generally steady with RW/min guard but does get unsteady turning. Does have a bit of cognitive impairment today- see section for details, needed multiple cues for safety and navigation during session. Able to find her way back to her room with (unplanned by therapist) visual cues from nutrition staff  But question if she would have been able to without cues otherwise. Left up in recliner with all needs met, chair alarm active. Continue to recommend SNF.   Follow Up Recommendations  SNF;Supervision/Assistance - 24 hour     Equipment Recommendations  Rolling walker with 5" wheels    Recommendations for Other Services       Precautions / Restrictions Precautions Precautions: Fall Restrictions Weight Bearing Restrictions: No    Mobility  Bed Mobility Overal bed mobility: Modified Independent             General bed mobility comments: increased time  Transfers Overall transfer level: Needs assistance Equipment used: Rolling walker (2 wheeled) Transfers: Sit to/from Stand Sit to Stand: Min guard         General transfer comment: min guard for safety, no true physical assist  given  Ambulation/Gait Ambulation/Gait assistance: Min guard;Min assist Gait Distance (Feet): 200 Feet Assistive device: Rolling walker (2 wheeled) Gait Pattern/deviations: Step-through pattern;Decreased step length - right;Decreased step length - left;Decreased stride length;Trunk flexed;Drifts right/left Gait velocity: decr   General Gait Details: more steady today with RW, does have moderate L drift and possibly some reduced awareness of obstacles on her left side, cues to avoid obstacles on the left. Overall min guard but needed MinA to maintain balance when making a U-turn. Also needed MinA for walking about 66f in room without device due to unsteadiness.   Stairs             Wheelchair Mobility    Modified Rankin (Stroke Patients Only)       Balance Overall balance assessment: Needs assistance Sitting-balance support: Feet supported;Bilateral upper extremity supported Sitting balance-Leahy Scale: Good     Standing balance support: No upper extremity supported;During functional activity Standing balance-Leahy Scale: Fair Standing balance comment: static fair, dynamic poor                            Cognition Arousal/Alertness: Awake/alert Behavior During Therapy: Flat affect Overall Cognitive Status: No family/caregiver present to determine baseline cognitive functioning Area of Impairment: Attention;Memory;Following commands;Safety/judgement;Awareness;Problem solving                   Current Attention Level: Sustained Memory: Decreased short-term memory;Decreased recall of precautions Following Commands: Follows one step commands consistently;Follows one step commands with increased time Safety/Judgement: Decreased awareness of safety;Decreased awareness of deficits Awareness: Intellectual Problem Solving: Slow processing;Decreased initiation;Requires verbal cues;Difficulty sequencing  General Comments: very pleasant but with definite  cognitive impairment- on one occasion able to tell us what her room # was but when asked a second and third time after distractions perseverated on "5M13" (which was the room # used to orient her to where to look for room numbers in the first place) and needed reminders of her room number. Was able to use visual cues from nutrition staff to identify her room (unplanned by therapist). Seems to have some decreased awareness L side in general.      Exercises      General Comments        Pertinent Vitals/Pain Pain Assessment: Faces Faces Pain Scale: No hurt Pain Intervention(s): Limited activity within patient's tolerance;Monitored during session    Home Living                      Prior Function            PT Goals (current goals can now be found in the care plan section) Acute Rehab PT Goals Patient Stated Goal: eat breakfast PT Goal Formulation: With patient Time For Goal Achievement: 08/21/20 Potential to Achieve Goals: Good Progress towards PT goals: Progressing toward goals    Frequency    Min 2X/week      PT Plan Discharge plan needs to be updated    Co-evaluation              AM-PAC PT "6 Clicks" Mobility   Outcome Measure  Help needed turning from your back to your side while in a flat bed without using bedrails?: A Little Help needed moving from lying on your back to sitting on the side of a flat bed without using bedrails?: A Little Help needed moving to and from a bed to a chair (including a wheelchair)?: A Little Help needed standing up from a chair using your arms (e.g., wheelchair or bedside chair)?: A Little Help needed to walk in hospital room?: A Little Help needed climbing 3-5 steps with a railing? : A Little 6 Click Score: 18    End of Session Equipment Utilized During Treatment: Gait belt Activity Tolerance: Patient tolerated treatment well Patient left: in chair;with call bell/phone within reach;with chair alarm set Nurse  Communication: Mobility status PT Visit Diagnosis: Other abnormalities of gait and mobility (R26.89);Unsteadiness on feet (R26.81)     Time: 4315-4008 PT Time Calculation (min) (ACUTE ONLY): 23 min  Charges:  $Gait Training: 8-22 mins (co-tx with OT)                     Windell Norfolk, DPT, PN1   Supplemental Physical Therapist [place]    Pager [phone] Acute Rehab Office [phone]

## 2020-08-17 NOTE — Evaluation (Signed)
Speech Language Pathology Evaluation Patient Details Name: Christina Nunez MRN: [id number] DOB: 27-Apr-1956 Today's Date: 08/17/2020 Time: 4818-5631 SLP Time Calculation (min) (ACUTE ONLY): 13 min  Problem List:  Patient Active Problem List   Diagnosis Date Noted  . Acute ischemic stroke ([place]) 08/16/2020  . Memory deficit 08/14/2020  . AKI (acute kidney injury) ([place]) 08/14/2020  . History of ischemic heart disease 08/14/2020  . Depression 08/14/2020  . Hypertensive urgency 08/14/2020  . Essential hypertension 08/13/2020  . Hypertensive emergency 08/13/2020   Past Medical History: History reviewed. No pertinent past medical history. Past Surgical History: History reviewed. No pertinent surgical history. HPI:  64 yo female presented to ED with weakness, imbalance, and right lower back/right sided abdominal pain. MRI on 12/30 showing lacunar stroke right centrum semiovale, consistent with small acute/subacute infarct and lacunar infarcts in the left cerebellar hemisphere, left basal ganglia region, left thalamus and bilateral centrum semiovale and corona radiata. 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 Pt recently moved to [place] from [place].  Lived for 20 years in [place] and worked as a Radio broadcast assistant. Per neuro notes, clinical symptoms do not correlate with neuro findings.   Assessment / Plan / Recommendation Clinical Impression  Pt was pleasant and communicative.  She demonstrated normal expressive and receptive language, generally fluent speech with occasional hesitations, no dysarthria.  Basic verbal problem solving, selective attention, and verbal recall were [facility]. Awareness and higher-level problem solving with mild impairments. She could not read menu, indicating her glasses prescription was not current so that she just avoids reading. She presented with facial/vocal tics that dissipated when she was fully engaged  in conversation.  Hx indicates baseline memory deficits, dementia, but no family present to provide details.  She may benefit from further SLP assessment at next level of care (SNF rehab pending per SW notes).  No further acute care SLP f/u warranted.    SLP Assessment  SLP Recommendation/Assessment: All further Speech Lanaguage Pathology  needs can be addressed in the next venue of care SLP Visit Diagnosis: Cognitive communication deficit (R41.841)    Follow Up Recommendations  Skilled Nursing facility               SLP Evaluation Cognition  Overall Cognitive Status: No family/caregiver present to determine baseline cognitive functioning Arousal/Alertness: Awake/alert Orientation Level: Oriented X4 Attention: Selective Selective Attention: Appears intact Selective Attention Impairment: Verbal basic Memory: Impaired Memory Impairment: Retrieval deficit Awareness: Impaired Problem Solving: Appears intact Problem Solving Impairment: Verbal basic       Comprehension  Auditory Comprehension Overall Auditory Comprehension: Appears within functional limits for tasks assessed Yes/No Questions: Within Functional Limits Commands: Within Functional Limits Visual Recognition/Discrimination Discrimination: Within Function Limits Reading Comprehension Reading Status: Unable to assess (comment) (pt stated glasses did not allow her to read small print)    Expression Expression Primary Mode of Expression: Verbal Verbal Expression Overall Verbal Expression: Appears within functional limits for tasks assessed Initiation: No impairment Level of Generative/Spontaneous Verbalization: Conversation Repetition: No impairment Naming: No impairment Written Expression Dominant Hand: Right Written Expression: Not tested   Oral / Motor  Oral Motor/Sensory Function Overall Oral Motor/Sensory Function: Within functional limits Motor Speech Overall Motor Speech: Appears within functional limits  for tasks assessed   GO                   Christina Nunez L. Christina Nunez, Christina Nunez Office number [phone] Pager [phone]  Christina Nunez  Christina Nunez 08/17/2020, 4:25 PM

## 2020-08-17 NOTE — Evaluation (Addendum)
Occupational Therapy Evaluation Patient Details Name: Christina Nunez MRN: [id number] DOB: 09/25/1955 Today's Date: 08/17/2020    History of Present Illness 64 yo female presenting to ED with wekaness, imbalance, and right lower back/right sided abdominal pain. MRI on 12/30 showing right centrum semiovale, consistent with small acute/subacute infarct and lacunar infarcts in the left cerebellar hemisphere, left basal ganglia region, left thalamus and bilateral centrum semiovale and corona radiata. 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 Pt recently moved to [place] from [place]   Clinical Impression   PTA, pt was living with her sister and performed BADLs. Pt currently requiring Min Guard-Min A for LB ADLs, ADLs in standing, and functional mobility with RW. Pt presenting with decreased cognition, balance, strength, and safety awareness. Pt reporting vision deficits but with further testing, information inconsistent. Additionally, unsure of pt baseline cognition, however, pt presenting with tick/sneezing noises, slight embellishment of symptoms, and poor safety. Pt would benefit from further acute OT to facilitate safe dc. Recommend dc to SNF for further OT to optimize safety, independence with ADLs, and return to PLOF.     Follow Up Recommendations  SNF    Equipment Recommendations  None recommended by OT    Recommendations for Other Services       Precautions / Restrictions Precautions Precautions: Fall Precaution Comments: monitor BP Restrictions Weight Bearing Restrictions: No      Mobility Bed Mobility Overal bed mobility: Modified Independent             General bed mobility comments: increased time    Transfers Overall transfer level: Needs assistance Equipment used: Rolling walker (2 wheeled) Transfers: Sit to/from Stand Sit to Stand: Min guard         General transfer comment: min guard  for safety, no true physical assist given    Balance Overall balance assessment: Needs assistance Sitting-balance support: Feet supported;Bilateral upper extremity supported Sitting balance-Leahy Scale: Good     Standing balance support: No upper extremity supported;During functional activity Standing balance-Leahy Scale: Fair Standing balance comment: static fair, dynamic poor                           ADL either performed or assessed with clinical judgement   ADL Overall ADL's : Needs assistance/impaired Eating/Feeding: Set up;Sitting   Grooming: Supervision/safety;Set up;Sitting   Upper Body Bathing: Set up;Supervision/ safety   Lower Body Bathing: Minimal assistance;Min guard;Sit to/from stand   Upper Body Dressing : Set up;Supervision/safety;Sitting   Lower Body Dressing: Minimal assistance;Min guard;Sit to/from stand   Toilet Transfer: Minimal assistance;Min guard;RW;Ambulation (simulated to recliner)           Functional mobility during ADLs: Min guard;Minimal assistance;Rolling walker General ADL Comments: Pt performing ADLs and functional mobility with Min guard-Min A. Pt presenting with inconsistent behaviors with [facility] balance, vision, or memory and then reporting poor     Vision Baseline Vision/History: Wears glasses Wears Glasses: At all times Patient Visual Report: No change from baseline Vision Assessment?: Yes Eye Alignment: Within Functional Limits Ocular Range of Motion: Within Functional Limits Alignment/Gaze Preference: Within Defined Limits Tracking/Visual Pursuits: Decreased smoothness of horizontal tracking;Decreased smoothness of vertical tracking Additional Comments: Pt reporting vision deficits, but with further testing, information inconsistent. Pt reporting that the christmas tree in hallway looked like it was floating away. Slight decreased in smooth tracking but feel this is more due to attention than vision     Perception  Praxis      Pertinent Vitals/Pain Pain Assessment: Faces Faces Pain Scale: Hurts a little bit Pain Location: R flank Pain Descriptors / Indicators: Grimacing;Discomfort;Sharp;Stabbing Pain Intervention(s): Monitored during session;Limited activity within patient's tolerance;Repositioned     Hand Dominance Right   Extremity/Trunk Assessment Upper Extremity Assessment Upper Extremity Assessment: Generalized weakness (Pt with inconsistent presentation. During formal MMT pt weak and embellishing symptoms. Then able to perform grooming task without difficulty)   Lower Extremity Assessment Lower Extremity Assessment: Defer to PT evaluation   Cervical / Trunk Assessment Cervical / Trunk Assessment: Normal   Communication Communication Communication: No difficulties   Cognition Arousal/Alertness: Awake/alert Behavior During Therapy: Flat affect Overall Cognitive Status: No family/caregiver present to determine baseline cognitive functioning Area of Impairment: Attention;Memory;Following commands;Safety/judgement;Awareness;Problem solving                   Current Attention Level: Sustained Memory: Decreased short-term memory;Decreased recall of precautions Following Commands: Follows one step commands consistently;Follows one step commands with increased time Safety/Judgement: Decreased awareness of safety;Decreased awareness of deficits Awareness: Intellectual Problem Solving: Slow processing;Decreased initiation;Requires verbal cues;Difficulty sequencing General Comments: very pleasant but with definite cognitive impairment- on one occasion able to tell us what her room # was but when asked a second and third time after distractions perseverated on "5M13" (which was the room # used to orient her to where to look for room numbers in the first place) and needed reminders of her room number. Was able to use visual cues from nutrition staff to identify her room (unplanned by  therapist). Seems to have some decreased awareness L side in general.   General Comments       Exercises     Shoulder Instructions      Home Living Family/patient expects to be discharged to:: Private residence Living Arrangements: Other relatives (Sister) Available Help at Discharge: Family;Available PRN/intermittently Type of Home: Apartment Home Access: Stairs to enter Entrance Stairs-Number of Steps: 6   Home Layout: Two level;Bed/bath upstairs Alternate Level Stairs-Number of Steps: 12   Bathroom Shower/Tub: Teacher, early years/pre: Standard     Home Equipment: None   Additional Comments: Information from chart review      Prior Functioning/Environment Level of Independence: Needs assistance  Gait / Transfers Assistance Needed: ambulatory without assistive device ADL's / Homemaking Assistance Needed: assist getting in/out of tub and with bathing            OT Problem List: Pain      OT Treatment/Interventions: Self-care/ADL training;Therapeutic exercise;Energy conservation;DME and/or AE instruction;Therapeutic activities;Patient/family education    OT Goals(Current goals can be found in the care plan section) Acute Rehab OT Goals Patient Stated Goal: eat breakfast OT Goal Formulation: With patient Time For Goal Achievement: 08/31/20 Potential to Achieve Goals: Good  OT Frequency: Min 2X/week   Barriers to D/C:            Co-evaluation              AM-PAC OT "6 Clicks" Daily Activity     Outcome Measure Help from another person eating meals?: None Help from another person taking care of personal grooming?: None Help from another person toileting, which includes using toliet, bedpan, or urinal?: None Help from another person bathing (including washing, rinsing, drying)?: A Little Help from another person to put on and taking off regular upper body clothing?: None Help from another person to put on and taking off regular lower body  clothing?: None 6  Click Score: 23   End of Session Nurse Communication: Mobility status;Other (comment) (BP)  Activity Tolerance: Patient tolerated treatment well Patient left: in chair;with call bell/phone within reach;with chair alarm set  OT Visit Diagnosis: Pain Pain - Right/Left: Right Pain - part of body:  (flank)                Time: 9794-8016 OT Time Calculation (min): 23 min Charges:  OT General Charges $OT Visit: 1 Visit OT Evaluation $OT Eval Moderate Complexity: [place], OTR/L Acute Rehab Pager: [phone] Office: [place] 08/17/2020, 3:57 PM

## 2020-08-18 LAB — BASIC METABOLIC PANEL
Anion gap: 11 (ref 5–15)
BUN: 66 mg/dL — ABNORMAL HIGH (ref 8–23)
CO2: 19 mmol/L — ABNORMAL LOW (ref 22–32)
Calcium: 8.6 mg/dL — ABNORMAL LOW (ref 8.9–10.3)
Chloride: 105 mmol/L (ref 98–111)
Creatinine, Ser: 4.92 mg/dL — ABNORMAL HIGH (ref 0.44–1.00)
GFR, Estimated: 9 mL/min — ABNORMAL LOW (ref >60)
Glucose, Bld: 170 mg/dL — ABNORMAL HIGH (ref 70–99)
Potassium: 4.2 mmol/L (ref 3.5–5.1)
Sodium: 135 mmol/L (ref 135–145)

## 2020-08-18 MED ORDER — SODIUM BICARBONATE 650 MG PO TABS
650.0000 mg | ORAL_TABLET | Freq: Two times a day (BID) | ORAL | Status: DC
  Administered 2020-08-18 – 2020-08-26 (×17): 650 mg via ORAL
  Filled 2020-08-18 (×17): qty 1

## 2020-08-18 NOTE — Progress Notes (Addendum)
PROGRESS NOTE    Christina Nunez  NIO:[id number] DOB: 08/22/1955 DOA: 08/13/2020 PCP: Medicine, [facility]   Brief Narrative: 65 year old with past medical history questionable for hypertension, dementia possible prior CABG presented to the ED for generalized weakness and difficulty ambulating.  Patient was found to be mildly confused.  Blood pressure was 218/109.  Patient was admitted with hypertensive urgency and renal failure with a creatinine of 3.7.  Chest x-ray showed vascular congestion and possible interstitial edema.  Patient presented to the ED with flank pain, questionable urinary retention, bladder scan only showed 30 cc.    Assessment & Plan:   Principal Problem:   Hypertensive emergency Active Problems:   Essential hypertension   Memory deficit   AKI (acute kidney injury) ([place])   History of ischemic heart disease   Depression   Hypertensive urgency   Acute ischemic stroke ([place])  1-Hypertensive urgency: Blood pressure better controlled.  Continue with norvasc and coreg.   Right Centrum Semiovale infarct  Probably related to small vessel diseases.  On aspirin and plavix Follow neurology recommendation for anticoagulation.  MRA; no large vessel occlusion.  Bilateral ICA siphon advanced atherosclerosis left more than right moderate supraclinoid ICA stenosis.  LDL 128, HbA1c 6.3 Risk factors modifications.  ECHO; normal EF, Diastolic dysfunction mitral regurgitation. No thrombus.   AKI, On CKD stage IV Sister will bring records. Prior records cr at 2.5 GFR 23. Cr 4 range.  Nephrology following.  Worsening renal function could be related to rapid correction of HTN,.  Stable. Cr 4.7--4.9 Nephrology sign off.   Weakness , dizziness; Confusion.  TSH and B 12 normal.  MRI positive for stroke,. Neurology think stroke was incidental.  PT evaluation.  Needs Rehab.   History of ischemic heart disease with status post CABG Continue with plavix,  lipitor, coreg.   Lower extremity pain: Doppler 12/28 -negative  GERD;  Continue with protonix  Depression/schizophrenia Continue with prozac.   Abdominal pain; Chronic Renal US negative for hydronephrosis.  CT renal protocol, negative for intraabdominal pathology [facility] constipation.  Had BM.  She report she has had abdominal pain for years.   Questionable history of dementia; She will need evaluation with neurology outpatient.   Covid vaccine; will be given during admission; sister agree.   Estimated body mass index is 31.89 kg/m as calculated from the following:   Height as of this encounter: 5\' 3"  (1.6 m).   Weight as of this encounter: 81.7 kg.   DVT prophylaxis: Heparin  Code Status: Full code Family Communication: care discussed with sister per patient request. 12/31 Disposition Plan:  Status is: Inpatient  Remains inpatient appropriate because:Ongoing diagnostic testing needed not appropriate for outpatient work up   Dispo: The patient is from: Home              Anticipated d/c is to: to be determine              Anticipated d/c date is: 1 day              Patient currently is medically stable to d/c. Awaiting SNF        Consultants:   Nephrology   Procedures:   Renal US, negative for hydroneprhosis   Antimicrobials:    Subjective: Alert, had BM. Abdominal pain is chronic. She has had this pain for years.   Objective: Vitals:   08/17/20 0938 08/17/20 1733 08/17/20 2034 08/18/20 0527  BP: 140/76 (!) 142/79 (!) 133/96 130/70  Pulse: 69  70 66 72  Resp: 16 17 16 16   Temp: 98.7 F (37.1 C) 99 F (37.2 C) 98.1 F (36.7 C) 99.1 F (37.3 C)  TempSrc: Oral Oral Oral Oral  SpO2: 98% 100% 100% 97%  Weight:      Height:        Intake/Output Summary (Last 24 hours) at 08/18/2020 1222 Last data filed at 08/18/2020 3710 Gross per 24 hour  Intake 700 ml  Output 100 ml  Net 600 ml   Filed Weights   08/13/20 1508 08/15/20 2132  Weight: 81.6 kg  81.7 kg    Examination:  General exam: NAD Respiratory system: CTA Cardiovascular system: S 1, S 2 RRR Gastrointestinal system: BS present, soft, nt Central nervous system: Alert, following command Extremities: no edema  Data Reviewed: I have personally reviewed following labs and imaging studies  CBC: Recent Labs  Lab 08/13/20 1618 08/14/20 0719 08/15/20 0239  WBC 8.0 7.0 9.1  NEUTROABS 4.3  --  6.4  HGB 10.5* 9.4* 9.4*  HCT 32.8* 29.3* 29.2*  MCV 89.6 91.3 89.8  PLT 167 141* 626*   Basic Metabolic Panel: Recent Labs  Lab 08/14/20 0719 08/15/20 0239 08/16/20 0014 08/17/20 0400 08/18/20 0222  NA 140 137 134* 135 135  K 4.7 4.7 4.7 4.6 4.2  CL 110 108 106 104 105  CO2 20* 18* 16* 21* 19*  GLUCOSE 119* 115* 121* 109* 170*  BUN 62* 67* 67* 67* 66*  CREATININE 3.85* 4.40* 4.78* 4.79* 4.92*  CALCIUM 9.2 8.7* 8.4* 8.7* 8.6*  MG  --  2.0  --   --   --   PHOS  --  6.0*  --   --   --    GFR: Estimated Creatinine Clearance: 11.8 mL/min (A) (by C-G formula based on SCr of 4.92 mg/dL (H)). Liver Function Tests: Recent Labs  Lab 08/13/20 1618 08/15/20 0239  AST 14* 9*  ALT 15 11  ALKPHOS 68 51  BILITOT 0.4 0.5  PROT 7.4 6.5  ALBUMIN 3.8 3.2*   No results for input(s): LIPASE, AMYLASE in the last 168 hours. No results for input(s): AMMONIA in the last 168 hours. Coagulation Profile: No results for input(s): INR, PROTIME in the last 168 hours. Cardiac Enzymes: No results for input(s): CKTOTAL, CKMB, CKMBINDEX, TROPONINI in the last 168 hours. BNP (last 3 results) No results for input(s): PROBNP in the last 8760 hours. HbA1C: No results for input(s): HGBA1C in the last 72 hours. CBG: Recent Labs  Lab 08/14/20 1151 08/14/20 1625 08/14/20 2040 08/15/20 0001 08/15/20 0807  GLUCAP 127* 118* 176* 113* 103*   Lipid Profile: Recent Labs    08/17/20 0400  CHOL 190  HDL 37*  LDLCALC 128*  TRIG 123  CHOLHDL 5.1   Thyroid Function Tests: No results for  input(s): TSH, T4TOTAL, FREET4, T3FREE, THYROIDAB in the last 72 hours. Anemia Panel: No results for input(s): VITAMINB12, FOLATE, FERRITIN, TIBC, IRON, RETICCTPCT in the last 72 hours. Sepsis Labs: No results for input(s): PROCALCITON, LATICACIDVEN in the last 168 hours.  Recent Results (from the past 240 hour(s))  SARS CORONAVIRUS 2 (TAT 6-24 HRS) Nasopharyngeal Nasopharyngeal Swab     Status: None   Collection Time: 08/13/20  8:22 PM   Specimen: Nasopharyngeal Swab  Result Value Ref Range Status   SARS Coronavirus 2 NEGATIVE NEGATIVE Final    Comment: (NOTE) SARS-CoV-2 target nucleic acids are NOT DETECTED.  The SARS-CoV-2 RNA is generally detectable in upper and lower respiratory specimens during  the acute phase of infection. Negative results do not preclude SARS-CoV-2 infection, do not rule out co-infections with other pathogens, and should not be used as the sole basis for treatment or other patient management decisions. Negative results must be combined with clinical observations, patient history, and epidemiological information. The expected result is Negative.  Fact Sheet for Patients: SugarRoll.be  Fact Sheet for Healthcare Providers: https://www.woods-mathews.com/  This test is not yet approved or cleared by the [place] FDA and  has been authorized for detection and/or diagnosis of SARS-CoV-2 by FDA under an Emergency Use Authorization (EUA). This EUA will remain  in effect (meaning this test can be used) for the duration of the COVID-19 declaration under Se ction 564(b)(1) of the Act, 21 U.S.C. section 360bbb-3(b)(1), unless the authorization is terminated or revoked sooner.  Performed at [facility], [place] [address], [place], [place] [address]   MRSA PCR Screening     Status: None   Collection Time: 08/14/20  3:38 AM   Specimen: Nasopharyngeal  Result Value Ref Range Status   MRSA by PCR NEGATIVE NEGATIVE  Final    Comment:        The GeneXpert MRSA Assay (FDA approved for NASAL specimens only), is one component of a comprehensive MRSA colonization surveillance program. It is not intended to diagnose MRSA infection nor to guide or monitor treatment for MRSA infections. Performed at [facility] [place] [address], [place], [place] [address]          Radiology Studies: DG Abd 1 View  Result Date: 08/17/2020 CLINICAL DATA:  Abdominal pain EXAM: ABDOMEN - 1 VIEW COMPARISON:  08/13/2020 CT abdomen/pelvis FINDINGS: No disproportionately dilated small bowel loops. No evidence of pneumatosis or pneumoperitoneum. Moderate stool and gas throughout the large bowel. No radiopaque nephrolithiasis. Stable tiny calcified right pelvic uterine fibroid as seen on recent CT. Clear lung bases. IMPRESSION: Nonobstructive bowel gas pattern. Moderate colonic stool and gas, which may indicate constipation. Electronically Signed   By: Ilona Sorrel M.D.   On: 08/17/2020 12:55   MR ANGIO HEAD WO CONTRAST  Result Date: 08/17/2020 CLINICAL DATA:  65 year old female with recent delirium common cephalopathy. Small acute white matter infarct in the anterior left centrum semiovale on MRI yesterday. EXAM: MRA HEAD WITHOUT CONTRAST TECHNIQUE: Angiographic images of the Circle of Willis were obtained using MRA technique without intravenous contrast. COMPARISON:  Brain MRI yesterday.  Neck MRA today. FINDINGS: Antegrade flow in the posterior circulation with codominant distal vertebral arteries. No distal vertebral stenosis. Normal left PICA origin. Dominant appearing right AICA is patent. Patent vertebrobasilar junction, basilar artery, bilateral AICA and SCA origins. Mild basilar tortuosity and irregularity without significant stenosis. Fetal type bilateral PCA origins, both P1 segments are present and tortuous. Mild bilateral PCA P2 segment irregularity. No significant PCA stenosis. Antegrade flow in both  ICA siphons. Tortuous right ICA below the skull base. Evidence of significant bilateral siphon atherosclerosis from the distal cavernous through the supraclinoid segments. Moderate left siphon stenosis at the anterior genu and the supraclinoid segment distal to the ophthalmic artery. Mild to moderate similar right ICA siphon stenosis. Ophthalmic artery and posterior communicating artery origins are normal. Patent carotid termini. Patent MCA and ACA origins. Mild stenosis at both ACA origins greater on the left. Diminutive or absent anterior communicating artery. Bilateral ACA branches are within normal limits. Both MCA M1 segments and MCA bifurcations are patent without stenosis. Visible bilateral MCA branches are within normal limits. IMPRESSION: 1. No large  vessel occlusion. 2. Evidence of advanced atherosclerosis of both ICA siphons, with up to moderate supraclinoid ICA stenosis greater on the left. 3. Only mild stenoses elsewhere in the anterior and posterior circulation. Electronically Signed   By: Genevie Ann M.D.   On: 08/17/2020 07:28   MR ANGIO NECK WO CONTRAST  Result Date: 08/17/2020 CLINICAL DATA:  65 year old female with recent delirium common cephalopathy. Small acute white matter infarct in the anterior left centrum semiovale on MRI yesterday. EXAM: MRA NECK WITHOUT CONTRAST TECHNIQUE: Angiographic images of the neck were obtained using MRA technique without intravenous contrast. Carotid stenosis measurements (when applicable) are obtained utilizing NASCET criteria, using the distal internal carotid diameter as the denominator. COMPARISON:  None. FINDINGS: 2D and 3D time-of-flight MRA images of the neck. Antegrade flow in bilateral cervical carotid and vertebral arteries to the skull base. Three vessel arch configuration. Mildly tortuous bilateral CCA is. Carotid bifurcations appear patent and within normal limits, without evidence of significant stenosis. Tortuous right ICA distal to the bulb.  Codominant vertebral arteries appear patent to the skull base with V2 and V3 tortuosity but no evidence of stenosis. IMPRESSION: Negative noncontrast neck MRA. Electronically Signed   By: Genevie Ann M.D.   On: 08/17/2020 07:18   MR BRAIN WO CONTRAST  Result Date: 08/16/2020 CLINICAL DATA:  Delirium. EXAM: MRI HEAD WITHOUT CONTRAST TECHNIQUE: Multiplanar, multiecho pulse sequences of the brain and surrounding structures were obtained without intravenous contrast. COMPARISON:  Head CT August 13, 2020. FINDINGS: Brain: Linear focus of restricted diffusion within the right centrum semiovale, consistent with acute/subacute infarct. Remote lacunar infarcts in the left cerebellar hemisphere, left basal ganglia region, left thalamus and bilateral centrum semiovale and corona radiata. Scattered foci of T2 hyperintensity are seen within the white matter of the cerebral hemispheres and within the pons, nonspecific, most likely related to chronic microangiopathic changes. No hemorrhage, hydrocephalus, extra-axial collection or mass lesion. Vascular: Normal flow voids. Skull and upper cervical spine: Normal marrow signal. Sinuses/Orbits: Negative. IMPRESSION: 1. Linear focus of restricted diffusion within the right centrum semiovale, consistent with small acute/subacute infarct. 2. Remote lacunar infarcts in the left cerebellar hemisphere, left basal ganglia region, left thalamus and bilateral centrum semiovale and corona radiata. 3. Mild chronic microangiopathic changes. Electronically Signed   By: Pedro Earls M.D.   On: 08/16/2020 20:40        Scheduled Meds: . amLODipine  2.5 mg Oral BID  . aspirin EC  81 mg Oral Daily  . atorvastatin  40 mg Oral Daily  . carvedilol  6.25 mg Oral BID WC  . Chlorhexidine Gluconate Cloth  6 each Topical Daily  . clopidogrel  75 mg Oral Daily  . FLUoxetine  80 mg Oral Daily  . heparin injection (subcutaneous)  5,000 Units Subcutaneous Q8H  . melatonin  3 mg  Oral QHS  . pantoprazole  40 mg Oral Daily  . sodium bicarbonate  650 mg Oral BID   Continuous Infusions:   LOS: 4 days    Time spent: 35 minutes    Chirstina Haan A Korynne Dols, MD [facility]   If 7PM-7AM, please contact night-coverage [email or web address]  08/18/2020, 12:22 PM

## 2020-08-18 NOTE — Plan of Care (Signed)
  Problem: Activity: Goal: Risk for activity intolerance will decrease Outcome: Progressing   Problem: Nutrition: Goal: Adequate nutrition will be maintained Outcome: Progressing   

## 2020-08-18 NOTE — Plan of Care (Signed)
  Problem: Education: Goal: Knowledge of General Education information will improve Description: Including pain rating scale, medication(s)/side effects and non-pharmacologic comfort measures Outcome: Progressing   Problem: Elimination: Goal: Will not experience complications related to bowel motility Outcome: Progressing   

## 2020-08-18 NOTE — Progress Notes (Signed)
Nephrology Follow-Up Consult note   Assessment/Recommendations: Christina Nunez is a/an 65 y.o. female with a past medical history significant for depression/anxiety, HTN, dementia, CABG, CKD, admitted for seere HTN, confusion, aki, and stroke.     Non-Oliguric AKI on CKD: Presumptive baseline CKD ~GFR 20 per family. Crt 3.7 on arrival and rose to 4.8. Family records indicated a baseline Crt of 2.5 but this may not be accurate of current baseline. May have had some ATN form overcorrection of HTN but also simply controlling BP likely caused Crt to rise which is permissible. Given stability in Crt we will sign off.  -Crt stabilizing at 4.8-4.9 today and UOP improving -Should be referred for outpt nephrology eval -Appars euvolemic -BP optimized -Continue to monitor daily Cr, Dose meds for GFR -Monitor Daily I/Os, Daily weight  -Maintain MAP>65 for optimal renal perfusion.  -Avoid nephrotoxic medications including NSAIDs and Vanc/Zosyn combo Hypertension: elevated on admission no much improved. Continue current medications  Stroke: found on MRI. Neuro following appreciate help  Anemia 2/2 CKD: hgb 9.4 no role for ESA at this time. CTM  CAD: H/o CABG. On plavix and lipitor  We will sign off at this time. Please contact on call nephrology if further assistance needed.   Recommendations conveyed to primary service.    [facility] 08/18/2020 9:14 AM  ___________________________________________________________  CC: AKI on CKD  Interval History/Subjective: No changes. Patient stable. No complaints   Medications:  Current Facility-Administered Medications  Medication Dose Route Frequency Provider Last Rate Last Admin  . acetaminophen (TYLENOL) tablet 325 mg  325 mg Oral Q6H PRN Cox, Amy N, DO   325 mg at 08/15/20 1557   Or  . acetaminophen (TYLENOL) suppository 325 mg  325 mg Rectal Q6H PRN Cox, Amy N, DO      . amLODipine (NORVASC) tablet 2.5 mg  2.5 mg  Oral BID Roney Jaffe, MD   2.5 mg at 08/18/20 0857  . aspirin EC tablet 81 mg  81 mg Oral Daily Aline August, MD   81 mg at 08/18/20 0858  . atorvastatin (LIPITOR) tablet 40 mg  40 mg Oral Daily Cox, Amy N, DO   40 mg at 08/18/20 0857  . carvedilol (COREG) tablet 6.25 mg  6.25 mg Oral BID WC Roney Jaffe, MD   6.25 mg at 08/18/20 0857  . Chlorhexidine Gluconate Cloth 2 % PADS 6 each  6 each Topical Daily Cox, Amy N, DO   6 each at 08/16/20 2139  . clopidogrel (PLAVIX) tablet 75 mg  75 mg Oral Daily Aline August, MD   75 mg at 08/18/20 0857  . FLUoxetine (PROZAC) capsule 80 mg  80 mg Oral Daily Cox, Amy N, DO   80 mg at 08/18/20 0858  . heparin injection 5,000 Units  5,000 Units Subcutaneous Q8H Cox, Amy N, DO   5,000 Units at 08/18/20 0524  . melatonin tablet 3 mg  3 mg Oral QHS Cox, Amy N, DO   3 mg at 08/17/20 2301  . ondansetron (ZOFRAN) tablet 4 mg  4 mg Oral Q6H PRN Cox, Amy N, DO       Or  . ondansetron (ZOFRAN) injection 4 mg  4 mg Intravenous Q6H PRN Cox, Amy N, DO      . oxyCODONE (Oxy IR/ROXICODONE) immediate release tablet 5 mg  5 mg Oral Q6H PRN Aline August, MD   5 mg at 08/17/20 1237  . pantoprazole (PROTONIX) EC tablet 40 mg  40 mg  Oral Daily Cox, Amy N, DO   40 mg at 08/18/20 0857  . sodium bicarbonate tablet 650 mg  650 mg Oral BID Reesa Chew, MD   650 mg at 08/18/20 6484      Review of Systems: 10 systems reviewed and negative except per interval history/subjective  Physical Exam: Vitals:   08/17/20 2034 08/18/20 0527  BP: (!) 133/96 130/70  Pulse: 66 72  Resp: 16 16  Temp: 98.1 F (36.7 C) 99.1 F (37.3 C)  SpO2: 100% 97%   No intake/output data recorded.  Intake/Output Summary (Last 24 hours) at 08/18/2020 0914 Last data filed at 08/18/2020 0700 Gross per 24 hour  Intake 460 ml  Output 100 ml  Net 360 ml   Constitutional: well-appearing, no acute distress ENMT: ears and nose without scars or lesions, MMM CV: normal rate, no  edema Respiratory: bilateral chest rise, normal work of breathing Gastrointestinal: soft, non-tender, no palpable masses or hernias Skin: no visible lesions or rashes   Test Results I personally reviewed new and old clinical labs and radiology tests Lab Results  Component Value Date   NA 135 08/18/2020   K 4.2 08/18/2020   CL 105 08/18/2020   CO2 19 (L) 08/18/2020   BUN 66 (H) 08/18/2020   CREATININE 4.92 (H) 08/18/2020   CALCIUM 8.6 (L) 08/18/2020   ALBUMIN 3.2 (L) 08/15/2020   PHOS 6.0 (H) 08/15/2020

## 2020-08-18 NOTE — Plan of Care (Signed)
  Problem: Education: Goal: Knowledge of General Education information will improve Description Including pain rating scale, medication(s)/side effects and non-pharmacologic comfort measures Outcome: Progressing   

## 2020-08-19 LAB — CBC
HCT: 25.5 % — ABNORMAL LOW (ref 36.0–46.0)
Hemoglobin: 8.8 g/dL — ABNORMAL LOW (ref 12.0–15.0)
MCH: 29.6 pg (ref 26.0–34.0)
MCHC: 34.5 g/dL (ref 30.0–36.0)
MCV: 85.9 fL (ref 80.0–100.0)
Platelets: 156 10*3/uL (ref 150–400)
RBC: 2.97 MIL/uL — ABNORMAL LOW (ref 3.87–5.11)
RDW: 14.7 % (ref 11.5–15.5)
WBC: 6.4 10*3/uL (ref 4.0–10.5)
nRBC: 0 % (ref 0.0–0.2)

## 2020-08-19 LAB — BASIC METABOLIC PANEL
Anion gap: 10 (ref 5–15)
BUN: 60 mg/dL — ABNORMAL HIGH (ref 8–23)
CO2: 19 mmol/L — ABNORMAL LOW (ref 22–32)
Calcium: 8.6 mg/dL — ABNORMAL LOW (ref 8.9–10.3)
Chloride: 107 mmol/L (ref 98–111)
Creatinine, Ser: 4.55 mg/dL — ABNORMAL HIGH (ref 0.44–1.00)
GFR, Estimated: 10 mL/min — ABNORMAL LOW (ref >60)
Glucose, Bld: 115 mg/dL — ABNORMAL HIGH (ref 70–99)
Potassium: 4.1 mmol/L (ref 3.5–5.1)
Sodium: 136 mmol/L (ref 135–145)

## 2020-08-19 NOTE — Progress Notes (Signed)
PROGRESS NOTE    Christina Nunez  YFV:[id number] DOB: Jul 11, 1956 DOA: 08/13/2020 PCP: Medicine, [facility]   Brief Narrative: 65 year old with past medical history questionable for hypertension, dementia possible prior CABG presented to the ED for generalized weakness and difficulty ambulating.  Patient was found to be mildly confused.  Blood pressure was 218/109.  Patient was admitted with hypertensive urgency and renal failure with a creatinine of 3.7.  Chest x-ray showed vascular congestion and possible interstitial edema.  Patient presented to the ED with flank pain, questionable urinary retention, bladder scan only showed 30 cc.    Assessment & Plan:   Principal Problem:   Hypertensive emergency Active Problems:   Essential hypertension   Memory deficit   AKI (acute kidney injury) ([place])   History of ischemic heart disease   Depression   Hypertensive urgency   Acute ischemic stroke ([place])  1-Hypertensive urgency: Blood pressure better controlled.  Continue with norvasc and coreg.   Right Centrum Semiovale infarct  Probably related to small vessel diseases.  On aspirin and plavix Follow neurology recommendation for anticoagulation.  MRA; no large vessel occlusion.  Bilateral ICA siphon advanced atherosclerosis left more than right moderate supraclinoid ICA stenosis.  LDL 128, HbA1c 6.3 Risk factors modifications.  ECHO; normal EF, Diastolic dysfunction mitral regurgitation. No thrombus.   AKI, On CKD stage IV Sister will bring records. Prior records cr at 2.5 GFR 23. Cr 4 range.  Nephrology following.  Worsening renal function could be related to rapid correction of HTN,.  Stable. Cr 4.7--4.9--4.5 stable.  Nephrology sign off.   Weakness , dizziness; Confusion.  TSH and B 12 normal.  MRI positive for stroke,. Neurology think stroke was incidental.  PT evaluation.  Needs Rehab.   History of ischemic heart disease with status post CABG Continue  with plavix, lipitor, coreg.   Lower extremity pain: Doppler 12/28 -negative  GERD;  Continue with protonix  Depression/schizophrenia Continue with prozac.   Abdominal pain; Chronic Renal US negative for hydronephrosis.  CT renal protocol, negative for intraabdominal pathology KUBl constipation.  Had BM.  She report she has had abdominal pain for years.   Questionable history of dementia; She will need evaluation with neurology outpatient.   Covid vaccine; will be given during admission; sister agree.   Estimated body mass index is 31.91 kg/m as calculated from the following:   Height as of this encounter: 5\' 3"  (1.6 m).   Weight as of this encounter: 81.7 kg.   DVT prophylaxis: Heparin  Code Status: Full code Family Communication: care discussed with sister per patient request. 12/01 Disposition Plan:  Status is: Inpatient  Remains inpatient appropriate because:Ongoing diagnostic testing needed not appropriate for outpatient work up   Dispo: The patient is from: Home              Anticipated d/c is to: to be determine              Anticipated d/c date is: 1 day              Patient currently is medically stable to d/c. Awaiting SNF        Consultants:   Nephrology   Procedures:   Renal US, negative for hydroneprhosis   Antimicrobials:    Subjective: No new complaints. She had BM  Objective: Vitals:   08/18/20 2137 08/19/20 0433 08/19/20 0500 08/19/20 1029  BP: (!) 141/83 124/67  (!) 146/81  Pulse: 72 69  71  Resp: 17 17  18  Temp: 98.8 F (37.1 C) 98.4 F (36.9 C)  (!) 100.9 F (38.3 C)  TempSrc: Oral Oral  Oral  SpO2: 100% 100%  100%  Weight: 81.7 kg  81.7 kg   Height:        Intake/Output Summary (Last 24 hours) at 08/19/2020 1425 Last data filed at 08/19/2020 1100 Gross per 24 hour  Intake 240 ml  Output 0 ml  Net 240 ml   Filed Weights   08/15/20 2132 08/18/20 2137 08/19/20 0500  Weight: 81.7 kg 81.7 kg 81.7 kg     Examination:  General exam: NAD Respiratory system: CTA Cardiovascular system: S 1, S 2  RRR Gastrointestinal system: BS present, soft, nt Central nervous system: Alert, following command Extremities: No edema  Data Reviewed: I have personally reviewed following labs and imaging studies  CBC: Recent Labs  Lab 08/13/20 1618 08/14/20 0719 08/15/20 0239 08/19/20 0433  WBC 8.0 7.0 9.1 6.4  NEUTROABS 4.3  --  6.4  --   HGB 10.5* 9.4* 9.4* 8.8*  HCT 32.8* 29.3* 29.2* 25.5*  MCV 89.6 91.3 89.8 85.9  PLT 167 141* 139* 854   Basic Metabolic Panel: Recent Labs  Lab 08/15/20 0239 08/16/20 0014 08/17/20 0400 08/18/20 0222 08/19/20 0433  NA 137 134* 135 135 136  K 4.7 4.7 4.6 4.2 4.1  CL 108 106 104 105 107  CO2 18* 16* 21* 19* 19*  GLUCOSE 115* 121* 109* 170* 115*  BUN 67* 67* 67* 66* 60*  CREATININE 4.40* 4.78* 4.79* 4.92* 4.55*  CALCIUM 8.7* 8.4* 8.7* 8.6* 8.6*  MG 2.0  --   --   --   --   PHOS 6.0*  --   --   --   --    GFR: Estimated Creatinine Clearance: 12.8 mL/min (A) (by C-G formula based on SCr of 4.55 mg/dL (H)). Liver Function Tests: Recent Labs  Lab 08/13/20 1618 08/15/20 0239  AST 14* 9*  ALT 15 11  ALKPHOS 68 51  BILITOT 0.4 0.5  PROT 7.4 6.5  ALBUMIN 3.8 3.2*   No results for input(s): LIPASE, AMYLASE in the last 168 hours. No results for input(s): AMMONIA in the last 168 hours. Coagulation Profile: No results for input(s): INR, PROTIME in the last 168 hours. Cardiac Enzymes: No results for input(s): CKTOTAL, CKMB, CKMBINDEX, TROPONINI in the last 168 hours. BNP (last 3 results) No results for input(s): PROBNP in the last 8760 hours. HbA1C: No results for input(s): HGBA1C in the last 72 hours. CBG: Recent Labs  Lab 08/14/20 1151 08/14/20 1625 08/14/20 2040 08/15/20 0001 08/15/20 0807  GLUCAP 127* 118* 176* 113* 103*   Lipid Profile: Recent Labs    08/17/20 0400  CHOL 190  HDL 37*  LDLCALC 128*  TRIG 123  CHOLHDL 5.1    Thyroid Function Tests: No results for input(s): TSH, T4TOTAL, FREET4, T3FREE, THYROIDAB in the last 72 hours. Anemia Panel: No results for input(s): VITAMINB12, FOLATE, FERRITIN, TIBC, IRON, RETICCTPCT in the last 72 hours. Sepsis Labs: No results for input(s): PROCALCITON, LATICACIDVEN in the last 168 hours.  Recent Results (from the past 240 hour(s))  SARS CORONAVIRUS 2 (TAT 6-24 HRS) Nasopharyngeal Nasopharyngeal Swab     Status: None   Collection Time: 08/13/20  8:22 PM   Specimen: Nasopharyngeal Swab  Result Value Ref Range Status   SARS Coronavirus 2 NEGATIVE NEGATIVE Final    Comment: (NOTE) SARS-CoV-2 target nucleic acids are NOT DETECTED.  The SARS-CoV-2 RNA is generally detectable  in upper and lower respiratory specimens during the acute phase of infection. Negative results do not preclude SARS-CoV-2 infection, do not rule out co-infections with other pathogens, and should not be used as the sole basis for treatment or other patient management decisions. Negative results must be combined with clinical observations, patient history, and epidemiological information. The expected result is Negative.  Fact Sheet for Patients: SugarRoll.be  Fact Sheet for Healthcare Providers: https://www.woods-mathews.com/  This test is not yet approved or cleared by the [place] FDA and  has been authorized for detection and/or diagnosis of SARS-CoV-2 by FDA under an Emergency Use Authorization (EUA). This EUA will remain  in effect (meaning this test can be used) for the duration of the COVID-19 declaration under Se ction 564(b)(1) of the Act, 21 U.S.C. section 360bbb-3(b)(1), unless the authorization is terminated or revoked sooner.  Performed at [facility], [place] [address], [place], [place] [address]   MRSA PCR Screening     Status: None   Collection Time: 08/14/20  3:38 AM   Specimen: Nasopharyngeal  Result Value Ref  Range Status   MRSA by PCR NEGATIVE NEGATIVE Final    Comment:        The GeneXpert MRSA Assay (FDA approved for NASAL specimens only), is one component of a comprehensive MRSA colonization surveillance program. It is not intended to diagnose MRSA infection nor to guide or monitor treatment for MRSA infections. Performed at [facility], [place] [address] [place], [place] [address]          Radiology Studies: No results found.      Scheduled Meds: . amLODipine  2.5 mg Oral BID  . aspirin EC  81 mg Oral Daily  . atorvastatin  40 mg Oral Daily  . carvedilol  6.25 mg Oral BID WC  . Chlorhexidine Gluconate Cloth  6 each Topical Daily  . clopidogrel  75 mg Oral Daily  . FLUoxetine  80 mg Oral Daily  . heparin injection (subcutaneous)  5,000 Units Subcutaneous Q8H  . melatonin  3 mg Oral QHS  . pantoprazole  40 mg Oral Daily  . sodium bicarbonate  650 mg Oral BID   Continuous Infusions:   LOS: 5 days    Time spent: 35 minutes    Christina Lortie A Taimane Stimmel, MD [facility]   If 7PM-7AM, please contact night-coverage [email or web address]  08/19/2020, 2:25 PM

## 2020-08-19 NOTE — Plan of Care (Signed)
  Problem: Education: Goal: Knowledge of General Education information will improve Description: Including pain rating scale, medication(s)/side effects and non-pharmacologic comfort measures Outcome: Progressing   Problem: Activity: Goal: Risk for activity intolerance will decrease Outcome: Progressing   Problem: Nutrition: Goal: Adequate nutrition will be maintained Outcome: Progressing   

## 2020-08-20 DIAGNOSIS — I10 Essential (primary) hypertension: Secondary | ICD-10-CM | POA: Diagnosis not present

## 2020-08-20 LAB — URINALYSIS, ROUTINE W REFLEX MICROSCOPIC
Bilirubin Urine: NEGATIVE
Glucose, UA: NEGATIVE mg/dL
Hgb urine dipstick: NEGATIVE
Ketones, ur: NEGATIVE mg/dL
Leukocytes,Ua: NEGATIVE
Nitrite: NEGATIVE
Protein, ur: >=300 mg/dL — AB
Specific Gravity, Urine: 1.014 (ref 1.005–1.030)
pH: 5 (ref 5.0–8.0)

## 2020-08-20 LAB — CBC
HCT: 26.1 % — ABNORMAL LOW (ref 36.0–46.0)
Hemoglobin: 8.6 g/dL — ABNORMAL LOW (ref 12.0–15.0)
MCH: 28.5 pg (ref 26.0–34.0)
MCHC: 33 g/dL (ref 30.0–36.0)
MCV: 86.4 fL (ref 80.0–100.0)
Platelets: 161 10*3/uL (ref 150–400)
RBC: 3.02 MIL/uL — ABNORMAL LOW (ref 3.87–5.11)
RDW: 14.6 % (ref 11.5–15.5)
WBC: 6 10*3/uL (ref 4.0–10.5)
nRBC: 0 % (ref 0.0–0.2)

## 2020-08-20 LAB — BASIC METABOLIC PANEL
Anion gap: 11 (ref 5–15)
BUN: 57 mg/dL — ABNORMAL HIGH (ref 8–23)
CO2: 19 mmol/L — ABNORMAL LOW (ref 22–32)
Calcium: 8.2 mg/dL — ABNORMAL LOW (ref 8.9–10.3)
Chloride: 104 mmol/L (ref 98–111)
Creatinine, Ser: 4.31 mg/dL — ABNORMAL HIGH (ref 0.44–1.00)
GFR, Estimated: 11 mL/min — ABNORMAL LOW (ref >60)
Glucose, Bld: 105 mg/dL — ABNORMAL HIGH (ref 70–99)
Potassium: 4.1 mmol/L (ref 3.5–5.1)
Sodium: 134 mmol/L — ABNORMAL LOW (ref 135–145)

## 2020-08-20 NOTE — Progress Notes (Signed)
PROGRESS NOTE    Christina Nunez  RRN:[id number] DOB: 29-Apr-1956 DOA: 08/13/2020 PCP: Medicine, [facility]   Brief Narrative: 65 year old with past medical history questionable for hypertension, dementia possible prior CABG presented to the ED for generalized weakness and difficulty ambulating.  Patient was found to be mildly confused.  Blood pressure was 218/109.  Patient was admitted with hypertensive urgency and renal failure with a creatinine of 3.7.  Chest x-ray showed vascular congestion and possible interstitial edema.  Patient presented to the ED with flank pain, questionable urinary retention, bladder scan only showed 30 cc.    Assessment & Plan:   Principal Problem:   Hypertensive emergency Active Problems:   Essential hypertension   Memory deficit   AKI (acute kidney injury) ([place])   History of ischemic heart disease   Depression   Hypertensive urgency   Acute ischemic stroke ([place])  1-Hypertensive urgency: Blood pressure better controlled.  Continue with norvasc and coreg.   Right Centrum Semiovale infarct  Probably related to small vessel diseases.  On aspirin and plavix Follow neurology recommendation for anticoagulation.  MRA; no large vessel occlusion.  Bilateral ICA siphon advanced atherosclerosis left more than right moderate supraclinoid ICA stenosis.  LDL 128, HbA1c 6.3 Risk factors modifications.  ECHO; normal EF, Diastolic dysfunction mitral regurgitation. No thrombus.   AKI, On CKD stage IV Sister will bring records. Prior records cr at 2.5 GFR 23. Cr 4 range.  Nephrology following.  Worsening renal function could be related to rapid correction of HTN,.  Stable. Cr 4.7--4.9--4.5--4.3 stable.  Nephrology sign off.   Low grade fever:  Fever yesterday.  Will send UA.  Denies cough.   Weakness , dizziness; Confusion.  TSH and B 12 normal.  MRI positive for stroke,. Neurology think stroke was incidental.  PT evaluation.  Needs  Rehab.   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   Lower extremity pain: Doppler 12/28 -negative  GERD;  Continue with protonix  Depression/schizophrenia Continue with prozac.   Abdominal pain; Chronic Renal US negative for hydronephrosis.  CT renal protocol, negative for intraabdominal pathology KUBl constipation.  Had BM.  She report she has had abdominal pain for years.   Questionable history of dementia; She will need evaluation with neurology outpatient.   Covid vaccine; will be given during admission; sister agree.   Estimated body mass index is 31.91 kg/m as calculated from the following:   Height as of this encounter: 5\' 3"  (1.6 m).   Weight as of this encounter: 81.7 kg.   DVT prophylaxis: Heparin  Code Status: Full code Family Communication: care discussed with sister per patient request. 12/01 Disposition Plan:  Status is: Inpatient  Remains inpatient appropriate because:Ongoing diagnostic testing needed not appropriate for outpatient work up   Dispo: The patient is from: Home              Anticipated d/c is to: to be determine              Anticipated d/c date is: 1 day              Patient currently is medically stable to d/c. Awaiting SNF        Consultants:   Nephrology   Procedures:   Renal US, negative for hydroneprhosis   Antimicrobials:    Subjective: She doesn't feel well today, she cant tell me why   Objective: Vitals:   08/19/20 1029 08/19/20 1634 08/19/20 2153  08/20/20 0920  BP: (!) 146/81 (!) 142/80 (!) 144/82 139/83  Pulse: 71 74 69 71  Resp: 18 18 17 18   Temp: (!) 100.9 F (38.3 C) 99.6 F (37.6 C) 99 F (37.2 C) 98.8 F (37.1 C)  TempSrc: Oral Oral Oral Oral  SpO2: 100% 100% 99% 98%  Weight:      Height:        Intake/Output Summary (Last 24 hours) at 08/20/2020 1533 Last data filed at 08/20/2020 1325 Gross per 24 hour  Intake 720 ml  Output 0 ml  Net  720 ml   Filed Weights   08/15/20 2132 08/18/20 2137 08/19/20 0500  Weight: 81.7 kg 81.7 kg 81.7 kg    Examination:  General exam: NAD Respiratory system: CTA Cardiovascular system: S 1, S 2 RRR Gastrointestinal system: BS present, soft, nt Central nervous system: alert, following command Extremities: no edema  Data Reviewed: I have personally reviewed following labs and imaging studies  CBC: Recent Labs  Lab 08/13/20 1618 08/14/20 0719 08/15/20 0239 08/19/20 0433 08/20/20 1155  WBC 8.0 7.0 9.1 6.4 6.0  NEUTROABS 4.3  --  6.4  --   --   HGB 10.5* 9.4* 9.4* 8.8* 8.6*  HCT 32.8* 29.3* 29.2* 25.5* 26.1*  MCV 89.6 91.3 89.8 85.9 86.4  PLT 167 141* 139* 156 580   Basic Metabolic Panel: Recent Labs  Lab 08/15/20 0239 08/16/20 0014 08/17/20 0400 08/18/20 0222 08/19/20 0433 08/20/20 1155  NA 137 134* 135 135 136 134*  K 4.7 4.7 4.6 4.2 4.1 4.1  CL 108 106 104 105 107 104  CO2 18* 16* 21* 19* 19* 19*  GLUCOSE 115* 121* 109* 170* 115* 105*  BUN 67* 67* 67* 66* 60* 57*  CREATININE 4.40* 4.78* 4.79* 4.92* 4.55* 4.31*  CALCIUM 8.7* 8.4* 8.7* 8.6* 8.6* 8.2*  MG 2.0  --   --   --   --   --   PHOS 6.0*  --   --   --   --   --    GFR: Estimated Creatinine Clearance: 13.5 mL/min (A) (by C-G formula based on SCr of 4.31 mg/dL (H)). Liver Function Tests: Recent Labs  Lab 08/13/20 1618 08/15/20 0239  AST 14* 9*  ALT 15 11  ALKPHOS 68 51  BILITOT 0.4 0.5  PROT 7.4 6.5  ALBUMIN 3.8 3.2*   No results for input(s): LIPASE, AMYLASE in the last 168 hours. No results for input(s): AMMONIA in the last 168 hours. Coagulation Profile: No results for input(s): INR, PROTIME in the last 168 hours. Cardiac Enzymes: No results for input(s): CKTOTAL, CKMB, CKMBINDEX, TROPONINI in the last 168 hours. BNP (last 3 results) No results for input(s): PROBNP in the last 8760 hours. HbA1C: No results for input(s): HGBA1C in the last 72 hours. CBG: Recent Labs  Lab 08/14/20 1151  08/14/20 1625 08/14/20 2040 08/15/20 0001 08/15/20 0807  GLUCAP 127* 118* 176* 113* 103*   Lipid Profile: No results for input(s): CHOL, HDL, LDLCALC, TRIG, CHOLHDL, LDLDIRECT in the last 72 hours. Thyroid Function Tests: No results for input(s): TSH, T4TOTAL, FREET4, T3FREE, THYROIDAB in the last 72 hours. Anemia Panel: No results for input(s): VITAMINB12, FOLATE, FERRITIN, TIBC, IRON, RETICCTPCT in the last 72 hours. Sepsis Labs: No results for input(s): PROCALCITON, LATICACIDVEN in the last 168 hours.  Recent Results (from the past 240 hour(s))  SARS CORONAVIRUS 2 (TAT 6-24 HRS) Nasopharyngeal Nasopharyngeal Swab     Status: None   Collection Time: 08/13/20  8:22  PM   Specimen: Nasopharyngeal Swab  Result Value Ref Range Status   SARS Coronavirus 2 NEGATIVE NEGATIVE Final    Comment: (NOTE) SARS-CoV-2 target nucleic acids are NOT DETECTED.  The SARS-CoV-2 RNA is generally detectable in upper and lower respiratory specimens during the acute phase of infection. Negative results do not preclude SARS-CoV-2 infection, do not rule out co-infections with other pathogens, and should not be used as the sole basis for treatment or other patient management decisions. Negative results must be combined with clinical observations, patient history, and epidemiological information. The expected result is Negative.  Fact Sheet for Patients: SugarRoll.be  Fact Sheet for Healthcare Providers: https://www.woods-mathews.com/  This test is not yet approved or cleared by the [place] FDA and  has been authorized for detection and/or diagnosis of SARS-CoV-2 by FDA under an Emergency Use Authorization (EUA). This EUA will remain  in effect (meaning this test can be used) for the duration of the COVID-19 declaration under Se ction 564(b)(1) of the Act, 21 U.S.C. section 360bbb-3(b)(1), unless the authorization is terminated or revoked  sooner.  Performed at [facility], [place] [address], [place], [place] [address]   MRSA PCR Screening     Status: None   Collection Time: 08/14/20  3:38 AM   Specimen: Nasopharyngeal  Result Value Ref Range Status   MRSA by PCR NEGATIVE NEGATIVE Final    Comment:        The GeneXpert MRSA Assay (FDA approved for NASAL specimens only), is one component of a comprehensive MRSA colonization surveillance program. It is not intended to diagnose MRSA infection nor to guide or monitor treatment for MRSA infections. Performed at [facility], [place] [address], [place], [place] [address]          Radiology Studies: No results found.      Scheduled Meds: . amLODipine  2.5 mg Oral BID  . aspirin EC  81 mg Oral Daily  . atorvastatin  40 mg Oral Daily  . carvedilol  6.25 mg Oral BID WC  . Chlorhexidine Gluconate Cloth  6 each Topical Daily  . clopidogrel  75 mg Oral Daily  . FLUoxetine  80 mg Oral Daily  . heparin injection (subcutaneous)  5,000 Units Subcutaneous Q8H  . melatonin  3 mg Oral QHS  . pantoprazole  40 mg Oral Daily  . sodium bicarbonate  650 mg Oral BID   Continuous Infusions:   LOS: 6 days    Time spent: 35 minutes    Katelyne Galster A Nelvin Tomb, MD [facility]   If 7PM-7AM, please contact night-coverage [email or web address]  08/20/2020, 3:33 PM

## 2020-08-20 NOTE — TOC Progression Note (Signed)
Transition of Care [facility]) - Progression Note    Patient Details  Name: Christina Nunez MRN: [id number] Date of Birth: 23-Apr-1956  [facility]) CM/SW Contact  Sharlet Salina Mila Homer, LCSW Phone Number: 08/20/2020, 12:14 PM  Clinical Narrative: Call made to [facility], admissions director at [facility] to regarding cancelling auth request for patient and her VM full. Text sent to [facility] regarding auth. Once Josem Kaufmann is cancelled, [facility] can initiate authorization.      Expected Discharge Plan: Skilled Nursing Facility Barriers to Discharge: Insurance Authorization  Expected Discharge Plan and Services Expected Discharge Plan: [place]   Discharge Planning Services: CM Consult   Living arrangements for the past 2 months: Single Family Home                                       Social Determinants of Health (SDOH) Interventions    Readmission Risk Interventions No flowsheet data found.

## 2020-08-20 NOTE — Plan of Care (Signed)
  Problem: Education: Goal: Knowledge of General Education information will improve Description: Including pain rating scale, medication(s)/side effects and non-pharmacologic comfort measures Outcome: Progressing   Problem: Safety: Goal: Ability to remain free from injury will improve Outcome: Progressing   

## 2020-08-20 NOTE — TOC Progression Note (Signed)
Transition of Care [facility]) - Progression Note    Patient Details  Name: Christina Nunez MRN: [id number] Date of Birth: 05/22/1956  Transition of Care [facility]) CM/SW Contact  Sharlet Salina Mila Homer, LCSW Phone Number: 08/20/2020, 5:05 PM  Clinical Narrative:  Talked with Loma Boston, admissions director at [facility] regarding patient. Per Loma Boston she has not initiated insurance auth on patient, nor did she contact Medicaid or send any information to patient's Medicaid plan. Call made to [facility], admissions director with [facility] and provided information received from [place] with [facility]. Ebony Hail will follow-up with patient's Medicaid plan.   Expected Discharge Plan: Skilled Nursing Facility Barriers to Discharge: Insurance Authorization  Expected Discharge Plan and Services Expected Discharge Plan: [place]   Discharge Planning Services: CM Consult   Living arrangements for the past 2 months: Single Family Home                                     Social Determinants of Health (SDOH) Interventions  No SDPH interventions requested or needed at this time  Readmission Risk Interventions No flowsheet data found.

## 2020-08-21 DIAGNOSIS — I10 Essential (primary) hypertension: Secondary | ICD-10-CM | POA: Diagnosis not present

## 2020-08-21 MED ORDER — AMLODIPINE BESYLATE 2.5 MG PO TABS: 2.5000 mg | ORAL_TABLET | Freq: Two times a day (BID) | ORAL | 0 refills | Status: DC

## 2020-08-21 MED ORDER — CARVEDILOL 6.25 MG PO TABS: 6.2500 mg | ORAL_TABLET | Freq: Two times a day (BID) | ORAL | 3 refills | Status: DC

## 2020-08-21 MED ORDER — ATORVASTATIN CALCIUM 40 MG PO TABS: 40.0000 mg | ORAL_TABLET | Freq: Every day | ORAL | 3 refills | Status: DC

## 2020-08-21 MED ORDER — SODIUM BICARBONATE 650 MG PO TABS: 650.0000 mg | ORAL_TABLET | Freq: Two times a day (BID) | ORAL | 0 refills | Status: DC

## 2020-08-21 MED ORDER — PANTOPRAZOLE SODIUM 40 MG PO TBEC: 40.0000 mg | DELAYED_RELEASE_TABLET | Freq: Every day | ORAL | 0 refills | Status: DC

## 2020-08-21 NOTE — Progress Notes (Signed)
Physical Therapy Treatment Patient Details Name: Christina Nunez MRN: [id number] DOB: 07-24-1956 Today's Date: 08/21/2020    History of Present Illness 65 yo female presenting to ED with wekaness, imbalance, and right lower back/right sided abdominal pain. MRI on 12/30 showing right centrum semiovale, consistent with small acute/subacute infarct and lacunar infarcts in the left cerebellar hemisphere, left basal ganglia region, left thalamus and bilateral centrum semiovale and corona radiata. 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 Pt recently moved to [place] from [place]    PT Comments    Patient received in bed, pleasant but not feeling well today and having increased flank pain which limited session. Politely declines hallway gait training due to pain but agreeable to getting up to chair. Reports dizziness, but VSS on RA. Needed up to MinA to safely get into recliner due to balance impairment. Left up in recliner with all needs met, chair alarm active. Will continue to follow and progress as able.     Follow Up Recommendations  SNF;Supervision/Assistance - 24 hour     Equipment Recommendations  Rolling walker with 5" wheels    Recommendations for Other Services       Precautions / Restrictions Precautions Precautions: Fall Precaution Comments: monitor BP Restrictions Weight Bearing Restrictions: No    Mobility  Bed Mobility Overal bed mobility: Modified Independent             General bed mobility comments: increased time  Transfers Overall transfer level: Needs assistance Equipment used: 1 person hand held assist Transfers: Sit to/from Stand Sit to Stand: Min guard         General transfer comment: min guard for safety, no true physical assist given  Ambulation/Gait Ambulation/Gait assistance: Min assist Gait Distance (Feet): 2 Feet Assistive device: 1 person hand held assist Gait  Pattern/deviations: Step-through pattern;Decreased step length - right;Decreased step length - left;Decreased stride length;Trunk flexed;Drifts right/left Gait velocity: decr   General Gait Details: steady for short in room distances; politely declines out of room ambulation due to increased flank pain this afternoon. MinA for balance in general without BUE support   Stairs             Wheelchair Mobility    Modified Rankin (Stroke Patients Only)       Balance Overall balance assessment: Needs assistance Sitting-balance support: Feet supported;Bilateral upper extremity supported Sitting balance-Leahy Scale: Good     Standing balance support: Single extremity supported;During functional activity Standing balance-Leahy Scale: Fair Standing balance comment: static fair, dynamic poor                            Cognition Arousal/Alertness: Awake/alert Behavior During Therapy: Flat affect Overall Cognitive Status: No family/caregiver present to determine baseline cognitive functioning Area of Impairment: Attention;Memory;Following commands;Safety/judgement;Awareness;Problem solving                   Current Attention Level: Sustained Memory: Decreased short-term memory;Decreased recall of precautions Following Commands: Follows one step commands consistently;Follows one step commands with increased time Safety/Judgement: Decreased awareness of safety;Decreased awareness of deficits Awareness: Intellectual Problem Solving: Slow processing;Decreased initiation;Requires verbal cues;Difficulty sequencing General Comments: pleasant but internally distracted today. Followed simple cues well with extended time. More pain limited than usual today.      Exercises      General Comments General comments (skin integrity, edema, etc.): VSS on RA      Pertinent Vitals/Pain Pain  Assessment: Faces Faces Pain Scale: Hurts little more Pain Location: R flank Pain  Descriptors / Indicators: Grimacing;Discomfort;Sharp;Stabbing Pain Intervention(s): Limited activity within patient's tolerance;Monitored during session;Repositioned    Home Living                      Prior Function            PT Goals (current goals can now be found in the care plan section) Acute Rehab PT Goals Patient Stated Goal: eat breakfast PT Goal Formulation: With patient Time For Goal Achievement: 09/04/20 Potential to Achieve Goals: Good Progress towards PT goals: Not progressing toward goals - comment (limited by pain today)    Frequency    Min 2X/week      PT Plan Current plan remains appropriate    Co-evaluation              AM-PAC PT "6 Clicks" Mobility   Outcome Measure  Help needed turning from your back to your side while in a flat bed without using bedrails?: A Little Help needed moving from lying on your back to sitting on the side of a flat bed without using bedrails?: A Little Help needed moving to and from a bed to a chair (including a wheelchair)?: A Little Help needed standing up from a chair using your arms (e.g., wheelchair or bedside chair)?: A Little Help needed to walk in hospital room?: A Little Help needed climbing 3-5 steps with a railing? : A Little 6 Click Score: 18    End of Session Equipment Utilized During Treatment: Gait belt Activity Tolerance: Patient limited by pain (flank pain) Patient left: in chair;with call bell/phone within reach;with chair alarm set Nurse Communication: Mobility status PT Visit Diagnosis: Other abnormalities of gait and mobility (R26.89);Unsteadiness on feet (R26.81)     Time: 9892-1194 PT Time Calculation (min) (ACUTE ONLY): 10 min  Charges:  $Therapeutic Activity: 8-22 mins                     Windell Norfolk, DPT, PN1   Supplemental Physical Therapist [place]    Pager [phone] Acute Rehab Office [phone]

## 2020-08-21 NOTE — Discharge Summary (Addendum)
Physician Discharge Summary  Whitlee Sluder RJJ:[id number] DOB: [birth year] DOA: 08/13/2020  PCP: Medicine, [place] date: 08/13/2020 Discharge date: 08/21/2020  Admitted From: Home  Disposition:  SNF  Recommendations for Outpatient Follow-up:  1. Follow up with PCP in 1-2 weeks 2. Please obtain BMP/CBC in one week 3. Monitor Volume status, resume lasix as needed.  4. Needs close follow up with Nephrology, Dr Joylene Grapes.  5. Needs follow up with neurology, for further evaluation of Dementia.    Discharge Condition: Stable.  CODE STATUS:  Diet recommendation: Heart Healthy   Brief/Interim Summary: 65 year old with past medical history for hypertension, questionable dementia possible prior CABG presented to the ED for generalized weakness and difficulty ambulating.  Patient was found to be mildly confused.  Blood pressure was 218/109.  Patient was admitted with hypertensive urgency and renal failure with a creatinine of 3.7.  Chest x-ray showed vascular congestion and possible interstitial edema.  Patient presented to the ED with flank pain, questionable urinary retention, bladder scan only showed 30 cc.  She subsequently develops worsening renal failure. Transfer to [facility] for Nephrology evaluation. Her renal function stabilized. Nephrology recommend out patient follow up.  For her confusion, MRI was obtain which showed acute stroke. Neurology think MRI finding was incidental. Patient needs risk factors modifications.   Patient is currently awaiting SNF>    1-Hypertensive urgency: Blood pressure better controlled.  Continue with norvasc and coreg.   Right Centrum Semiovale infarct  Probably related to small vessel diseases.  On aspirin and plavix Follow neurology recommendation for anticoagulation.  MRA; no large vessel occlusion.  Bilateral ICA siphon advanced atherosclerosis left more than right moderate supraclinoid ICA stenosis.  LDL 128, HbA1c  6.3 Risk factors modifications.  ECHO; normal EF, Diastolic dysfunction mitral regurgitation. No thrombus.   AKI, On CKD stage IV Sister will bring records. Prior records cr at 2.5 GFR 23. Cr 4 range.  Nephrology following.  Worsening renal function could be related to rapid correction of HTN,.  Stable. Cr 4.7--4.9--4.5--4.3 stable.  Nephrology sign off.  Holding lasix.   Low grade fever: Resolved.  Had low grade fever; 08/19/2020. UA negative,  Denies cough.  Resolved.   Weakness , dizziness; Confusion.  TSH and B 12 normal.  MRI positive for stroke,. Neurology think stroke was incidental.  PT evaluation.  Needs Rehab.  Check orthostatic vitals.   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   Lower extremity pain: Doppler 12/28 -negative  GERD;  Continue with protonix  Depression/schizophrenia Continue with prozac.   Abdominal pain; Chronic Renal US negative for hydronephrosis.  CT renal protocol, negative for intraabdominal pathology [facility] constipation.  Had BM.  She report she has had abdominal pain for years.   Questionable history of dementia; She will need evaluation with neurology outpatient.   Covid vaccine; Received first covid vaccine inpatient.   Chronic Diastolic Heart Failure; compensated.   Discharge Diagnoses:  Principal Problem:   Hypertensive emergency Active Problems:   Essential hypertension   Memory deficit   AKI (acute kidney injury) ([place])   History of ischemic heart disease   Depression   Hypertensive urgency   Acute ischemic stroke [facility])    Discharge Instructions  Discharge Instructions    Ambulatory referral to Neurology   Complete by: As directed    Follow up with Dr. Leonie Man at [facility] in 4-6 weeks. Too complicated for RN to follow. Thanks.   Diet - low  sodium heart healthy   Complete by: As directed    Increase activity slowly   Complete by: As directed       Allergies as of 08/21/2020   No Known Allergies     Medication List    STOP taking these medications   furosemide 80 MG tablet Commonly known as: LASIX     TAKE these medications   amLODipine 2.5 MG tablet Commonly known as: NORVASC Take 1 tablet (2.5 mg total) by mouth 2 (two) times daily. What changed:   medication strength  how much to take  when to take this   aspirin EC 81 MG tablet Take 81 mg by mouth daily. Swallow whole.   atorvastatin 40 MG tablet Commonly known as: LIPITOR Take 1 tablet (40 mg total) by mouth daily. Start taking on: August 22, 2020   carvedilol 6.25 MG tablet Commonly known as: COREG Take 1 tablet (6.25 mg total) by mouth 2 (two) times daily with a meal.   clopidogrel 75 MG tablet Commonly known as: PLAVIX Take 75 mg by mouth daily.   FLUoxetine 40 MG capsule Commonly known as: PROZAC Take 80 mg by mouth daily.   pantoprazole 40 MG tablet Commonly known as: PROTONIX Take 1 tablet (40 mg total) by mouth daily. Start taking on: August 22, 2020   sodium bicarbonate 650 MG tablet Take 1 tablet (650 mg total) by mouth 2 (two) times daily.   Vitamin D (Ergocalciferol) 1.25 MG (50000 UNIT) Caps capsule Commonly known as: DRISDOL Take 50,000 Units by mouth every 7 (seven) days.       Contact information for follow-up providers    Garvin Fila, MD. Schedule an appointment as soon as possible for a visit in 4 week(s).   Specialties: Neurology, Radiology Contact information: [address] [place] [address] [phone]        Reesa Chew, MD Follow up in 1 week(s).   Specialty: Internal Medicine Why: call office to schedule appointment.  Contact information: [place] [address] [phone]            Contact information for after-discharge care    [facility] .   Service: Skilled Nursing Contact information: [address] [place]  [place] [phone]                 No Known Allergies  Consultations:  Neurology   Nephrology    Procedures/Studies: DG Abd 1 View  Result Date: 08/17/2020 CLINICAL DATA:  Abdominal pain EXAM: ABDOMEN - 1 VIEW COMPARISON:  08/13/2020 CT abdomen/pelvis FINDINGS: No disproportionately dilated small bowel loops. No evidence of pneumatosis or pneumoperitoneum. Moderate stool and gas throughout the large bowel. No radiopaque nephrolithiasis. Stable tiny calcified right pelvic uterine fibroid as seen on recent CT. Clear lung bases. IMPRESSION: Nonobstructive bowel gas pattern. Moderate colonic stool and gas, which may indicate constipation. Electronically Signed   By: Ilona Sorrel M.D.   On: 08/17/2020 12:55   CT Head Wo Contrast  Result Date: 08/13/2020 CLINICAL DATA:  Encephalopathy EXAM: CT HEAD WITHOUT CONTRAST TECHNIQUE: Contiguous axial images were obtained from the base of the skull through the vertex without intravenous contrast. COMPARISON:  None. FINDINGS: Brain: There is no mass, hemorrhage or extra-axial collection. The size and configuration of the ventricles and extra-axial CSF spaces are normal. There is hypoattenuation of the white matter, most commonly indicating chronic small vessel disease. Vascular: No abnormal hyperdensity of the  major intracranial arteries or dural venous sinuses. No intracranial atherosclerosis. Skull: The visualized skull base, calvarium and extracranial soft tissues are normal. Sinuses/Orbits: No fluid levels or advanced mucosal thickening of the visualized paranasal sinuses. No mastoid or middle ear effusion. The orbits are normal. IMPRESSION: Chronic small vessel disease without acute intracranial abnormality. Electronically Signed   By: Ulyses Jarred M.D.   On: 08/13/2020 23:24   MR ANGIO HEAD WO CONTRAST  Result Date: 08/17/2020 CLINICAL DATA:  65 year old female with recent delirium common cephalopathy. Small acute white matter  infarct in the anterior left centrum semiovale on MRI yesterday. EXAM: MRA HEAD WITHOUT CONTRAST TECHNIQUE: Angiographic images of the Circle of Willis were obtained using MRA technique without intravenous contrast. COMPARISON:  Brain MRI yesterday.  Neck MRA today. FINDINGS: Antegrade flow in the posterior circulation with codominant distal vertebral arteries. No distal vertebral stenosis. Normal left PICA origin. Dominant appearing right AICA is patent. Patent vertebrobasilar junction, basilar artery, bilateral AICA and SCA origins. Mild basilar tortuosity and irregularity without significant stenosis. Fetal type bilateral PCA origins, both P1 segments are present and tortuous. Mild bilateral PCA P2 segment irregularity. No significant PCA stenosis. Antegrade flow in both ICA siphons. Tortuous right ICA below the skull base. Evidence of significant bilateral siphon atherosclerosis from the distal cavernous through the supraclinoid segments. Moderate left siphon stenosis at the anterior genu and the supraclinoid segment distal to the ophthalmic artery. Mild to moderate similar right ICA siphon stenosis. Ophthalmic artery and posterior communicating artery origins are normal. Patent carotid termini. Patent MCA and ACA origins. Mild stenosis at both ACA origins greater on the left. Diminutive or absent anterior communicating artery. Bilateral ACA branches are within normal limits. Both MCA M1 segments and MCA bifurcations are patent without stenosis. Visible bilateral MCA branches are within normal limits. IMPRESSION: 1. No large vessel occlusion. 2. Evidence of advanced atherosclerosis of both ICA siphons, with up to moderate supraclinoid ICA stenosis greater on the left. 3. Only mild stenoses elsewhere in the anterior and posterior circulation. Electronically Signed   By: Genevie Ann M.D.   On: 08/17/2020 07:28   MR ANGIO NECK WO CONTRAST  Result Date: 08/17/2020 CLINICAL DATA:  65 year old female with recent  delirium common cephalopathy. Small acute white matter infarct in the anterior left centrum semiovale on MRI yesterday. EXAM: MRA NECK WITHOUT CONTRAST TECHNIQUE: Angiographic images of the neck were obtained using MRA technique without intravenous contrast. Carotid stenosis measurements (when applicable) are obtained utilizing NASCET criteria, using the distal internal carotid diameter as the denominator. COMPARISON:  None. FINDINGS: 2D and 3D time-of-flight MRA images of the neck. Antegrade flow in bilateral cervical carotid and vertebral arteries to the skull base. Three vessel arch configuration. Mildly tortuous bilateral CCA is. Carotid bifurcations appear patent and within normal limits, without evidence of significant stenosis. Tortuous right ICA distal to the bulb. Codominant vertebral arteries appear patent to the skull base with V2 and V3 tortuosity but no evidence of stenosis. IMPRESSION: Negative noncontrast neck MRA. Electronically Signed   By: Genevie Ann M.D.   On: 08/17/2020 07:18   MR BRAIN WO CONTRAST  Result Date: 08/16/2020 CLINICAL DATA:  Delirium. EXAM: MRI HEAD WITHOUT CONTRAST TECHNIQUE: Multiplanar, multiecho pulse sequences of the brain and surrounding structures were obtained without intravenous contrast. COMPARISON:  Head CT August 13, 2020. FINDINGS: Brain: Linear focus of restricted diffusion within the right centrum semiovale, consistent with acute/subacute infarct. Remote lacunar infarcts in the left cerebellar hemisphere, left basal ganglia region, left  thalamus and bilateral centrum semiovale and corona radiata. Scattered foci of T2 hyperintensity are seen within the white matter of the cerebral hemispheres and within the pons, nonspecific, most likely related to chronic microangiopathic changes. No hemorrhage, hydrocephalus, extra-axial collection or mass lesion. Vascular: Normal flow voids. Skull and upper cervical spine: Normal marrow signal. Sinuses/Orbits: Negative.  IMPRESSION: 1. Linear focus of restricted diffusion within the right centrum semiovale, consistent with small acute/subacute infarct. 2. Remote lacunar infarcts in the left cerebellar hemisphere, left basal ganglia region, left thalamus and bilateral centrum semiovale and corona radiata. 3. Mild chronic microangiopathic changes. Electronically Signed   By: Pedro Earls M.D.   On: 08/16/2020 20:40   US RENAL  Result Date: 08/15/2020 CLINICAL DATA:  Evaluate etiology of renal failure. EXAM: RENAL / URINARY TRACT ULTRASOUND COMPLETE COMPARISON:  08/13/2020 FINDINGS: Right Kidney: Renal measurements: 10.4 x 4.9 x 5.9 cm = volume: 158 mL. Echogenicity within normal limits. No mass or hydronephrosis visualized. Left Kidney: Renal measurements: 10.0 x 5.3 x 5.5 cm = volume: 152 mL. Echogenicity within normal limits. No mass or hydronephrosis visualized. Bladder: Appears normal for degree of bladder distention. Other: None. IMPRESSION: 1. Normal renal sonogram. Electronically Signed   By: Kerby Moors M.D.   On: 08/15/2020 16:51   DG Chest Portable 1 View  Result Date: 08/13/2020 CLINICAL DATA:  Abdomen pain hypertension EXAM: PORTABLE CHEST 1 VIEW COMPARISON:  None. FINDINGS: Post sternotomy changes. Cardiomegaly with vascular congestion and mild diffuse interstitial opacity, likely edema. No pneumothorax. IMPRESSION: Cardiomegaly with vascular congestion and probable mild interstitial edema. Electronically Signed   By: Donavan Foil M.D.   On: 08/13/2020 21:32   ECHOCARDIOGRAM COMPLETE  Result Date: 08/14/2020    ECHOCARDIOGRAM REPORT   Patient Name:   MARLAYSIA LENIG Date of Exam: 08/14/2020 Medical Rec #:  [id number]    Height:       63.0 in Accession #:    [id number]   Weight:       180.0 lb Date of Birth:  1955-11-16    BSA:          1.849 m Patient Age:    65 years     BP:           154/87 mmHg Patient Gender: F            HR:           72 bpm. Exam Location:  Inpatient Procedure: 2D  Echo Indications:    Congestive Heart Failure  History:        Patient has no prior history of Echocardiogram examinations.  Sonographer:    Mikki Santee RDCS (AE) Referring Phys: [id number] AMY N [facility] IMPRESSIONS  1. Left ventricular ejection fraction, by estimation, is 50 to 55%. The left ventricle has low normal function. The left ventricle demonstrates regional wall motion abnormalities (see scoring diagram/findings for description). There is mild concentric left ventricular hypertrophy. Left ventricular diastolic parameters are consistent with Grade II diastolic dysfunction (pseudonormalization). Elevated left atrial pressure. There is mild hypokinesis of the left ventricular, basal-mid inferior wall and inferoseptal wall.  2. Right ventricular systolic function is normal. The right ventricular size is normal. There is normal pulmonary artery systolic pressure.  3. Left atrial size was mildly dilated.  4. The mitral valve is normal in structure. Moderate mitral valve regurgitation.  5. The aortic valve is tricuspid. Aortic valve regurgitation is not visualized. No aortic stenosis is present.  6. The inferior vena cava is  normal in size with greater than 50% respiratory variability, suggesting right atrial pressure of 3 mmHg. Comparison(s): No prior Echocardiogram. FINDINGS  Left Ventricle: Left ventricular ejection fraction, by estimation, is 50 to 55%. The left ventricle has low normal function. The left ventricle demonstrates regional wall motion abnormalities. Mild hypokinesis of the left ventricular, basal-mid inferior  wall and inferoseptal wall. The left ventricular internal cavity size was normal in size. There is mild concentric left ventricular hypertrophy. Abnormal (paradoxical) septal motion consistent with post-operative status. Left ventricular diastolic parameters are consistent with Grade II diastolic dysfunction (pseudonormalization). Elevated left atrial pressure. Right Ventricle: The right  ventricular size is normal. No increase in right ventricular wall thickness. Right ventricular systolic function is normal. There is normal pulmonary artery systolic pressure. The tricuspid regurgitant velocity is 1.88 m/s, and  with an assumed right atrial pressure of 3 mmHg, the estimated right ventricular systolic pressure is 28.3 mmHg. Left Atrium: Left atrial size was mildly dilated. Right Atrium: Right atrial size was normal in size. Pericardium: There is no evidence of pericardial effusion. Mitral Valve: The mitral valve is normal in structure. Moderate mitral valve regurgitation, with centrally-directed jet. Tricuspid Valve: The tricuspid valve is normal in structure. Tricuspid valve regurgitation is trivial. Aortic Valve: The aortic valve is tricuspid. Aortic valve regurgitation is not visualized. No aortic stenosis is present. Pulmonic Valve: The pulmonic valve was normal in structure. Pulmonic valve regurgitation is not visualized. Aorta: The aortic root and ascending aorta are structurally normal, with no evidence of dilitation. Venous: The inferior vena cava is normal in size with greater than 50% respiratory variability, suggesting right atrial pressure of 3 mmHg. IAS/Shunts: No atrial level shunt detected by color flow Doppler.  LEFT VENTRICLE PLAX 2D LVIDd:         4.30 cm  Diastology LVIDs:         3.20 cm  LV e' medial:    5.03 cm/s LV PW:         1.30 cm  LV E/e' medial:  20.3 LV IVS:        1.20 cm  LV e' lateral:   4.54 cm/s LVOT diam:     2.20 cm  LV E/e' lateral: 22.5 LV SV:         62 LV SV Index:   34 LVOT Area:     3.80 cm  LEFT ATRIUM             Index       RIGHT ATRIUM           Index LA diam:        3.70 cm 2.00 cm/m  RA Area:     11.40 cm LA Vol (A2C):   72.4 ml 39.16 ml/m RA Volume:   19.80 ml  10.71 ml/m LA Vol (A4C):   59.4 ml 32.13 ml/m LA Biplane Vol: 67.2 ml 36.34 ml/m  AORTIC VALVE LVOT Vmax:   69.80 cm/s LVOT Vmean:  47.800 cm/s LVOT VTI:    0.163 m  AORTA Ao Root diam:  3.00 cm MITRAL VALVE                 TRICUSPID VALVE MV Area (PHT): 4.15 cm      TR Peak grad:   14.1 mmHg MV Decel Time: 183 msec      TR Vmax:        188.00 cm/s MR Peak grad:    96.0 mmHg MR Mean grad:    63.0  mmHg   SHUNTS MR Vmax:         490.00 cm/s Systemic VTI:  0.16 m MR Vmean:        380.0 cm/s  Systemic Diam: 2.20 cm MR PISA:         2.26 cm MR PISA Eff ROA: 18 mm MR PISA Radius:  0.60 cm MV E velocity: 102.00 cm/s MV A velocity: 80.60 cm/s MV E/A ratio:  1.27 Mihai Croitoru MD Electronically signed by Sanda Klein MD Signature Date/Time: 08/14/2020/11:49:55 AM    Final    CT Renal Stone Study  Result Date: 08/13/2020 CLINICAL DATA:  Flank pain, urinary retention EXAM: CT ABDOMEN AND PELVIS WITHOUT CONTRAST TECHNIQUE: Multidetector CT imaging of the abdomen and pelvis was performed following the standard protocol without IV contrast. COMPARISON:  None. FINDINGS: Lower chest: There is a trace right pleural effusion. Patchy areas of consolidation at the lung bases likely reflect atelectasis. No pericardial effusion. Hepatobiliary: Small gallstones are seen layering dependently in the gallbladder. No evidence of acute cholecystitis. Unenhanced imaging of the liver is unremarkable. No biliary dilation. Pancreas: Unremarkable. No pancreatic ductal dilatation or surrounding inflammatory changes. Spleen: Normal in size without focal abnormality. Adrenals/Urinary Tract: No urinary tract calculi or obstructive uropathy. Bladder is unremarkable. The adrenals are normal. Stomach/Bowel: No bowel obstruction or ileus. Normal appendix right lower quadrant. No bowel wall thickening or inflammatory change. Vascular/Lymphatic: Aortic atherosclerosis. No enlarged abdominal or pelvic lymph nodes. Reproductive: Degenerating uterine fibroid right fundal aspect measuring up to 3.6 cm. Otherwise the uterus and adnexal structures are unremarkable. Other: No free fluid or free gas. No abdominal wall hernia.  Musculoskeletal: No acute or destructive bony lesions. Reconstructed images demonstrate no additional findings. IMPRESSION: 1. No urinary tract calculi or obstructive uropathy. 2. Trace right pleural effusion. 3. Cholelithiasis without evidence of acute cholecystitis. 4. Degenerating uterine fibroid. 5. Aortic Atherosclerosis (ICD10-I70.0). Electronically Signed   By: Randa Ngo M.D.   On: 08/13/2020 17:47   VAS [place] LOWER EXTREMITY VENOUS (DVT)  Result Date: 08/14/2020  Lower Venous DVT Study Indications: Edema.  Risk Factors: None identified. Comparison Study: No prior studies. Performing Technologist: Oliver Hum RVT  Examination Guidelines: A complete evaluation includes B-mode imaging, spectral Doppler, color Doppler, and power Doppler as needed of all accessible portions of each vessel. Bilateral testing is considered an integral part of a complete examination. Limited examinations for reoccurring indications may be performed as noted. The reflux portion of the exam is performed with the patient in reverse Trendelenburg.  +---------+---------------+---------+-----------+----------+--------------+ RIGHT    CompressibilityPhasicitySpontaneityPropertiesThrombus Aging +---------+---------------+---------+-----------+----------+--------------+ CFV      Full           Yes      Yes                                 +---------+---------------+---------+-----------+----------+--------------+ SFJ      Full                                                        +---------+---------------+---------+-----------+----------+--------------+ FV Prox  Full                                                        +---------+---------------+---------+-----------+----------+--------------+  FV Mid   Full                                                        +---------+---------------+---------+-----------+----------+--------------+ FV DistalFull                                                         +---------+---------------+---------+-----------+----------+--------------+ PFV      Full                                                        +---------+---------------+---------+-----------+----------+--------------+ POP      Full           Yes      Yes                                 +---------+---------------+---------+-----------+----------+--------------+ PTV      Full                                                        +---------+---------------+---------+-----------+----------+--------------+ PERO     Full                                                        +---------+---------------+---------+-----------+----------+--------------+   +---------+---------------+---------+-----------+----------+--------------+ LEFT     CompressibilityPhasicitySpontaneityPropertiesThrombus Aging +---------+---------------+---------+-----------+----------+--------------+ CFV      Full           Yes      Yes                                 +---------+---------------+---------+-----------+----------+--------------+ SFJ      Full                                                        +---------+---------------+---------+-----------+----------+--------------+ FV Prox  Full                                                        +---------+---------------+---------+-----------+----------+--------------+ FV Mid   Full                                                        +---------+---------------+---------+-----------+----------+--------------+  FV DistalFull                                                        +---------+---------------+---------+-----------+----------+--------------+ PFV      Full                                                        +---------+---------------+---------+-----------+----------+--------------+ POP      Full           Yes      Yes                                  +---------+---------------+---------+-----------+----------+--------------+ PTV      Full                                                        +---------+---------------+---------+-----------+----------+--------------+ PERO     Full                                                        +---------+---------------+---------+-----------+----------+--------------+     Summary: RIGHT: - There is no evidence of deep vein thrombosis in the lower extremity.  - No cystic structure found in the popliteal fossa.  LEFT: - There is no evidence of deep vein thrombosis in the lower extremity.  - No cystic structure found in the popliteal fossa.  *See table(s) above for measurements and observations. Electronically signed by Servando Snare MD on 08/14/2020 at 2:12:38 PM.    Final      Subjective: Feeling better today. Report mild dizziness  Discharge Exam: Vitals:   08/21/20 0628 08/21/20 0916  BP: (!) 153/85 (!) 161/82  Pulse: 71 66  Resp: 18 18  Temp: 98.2 F (36.8 C) 98.4 F (36.9 C)  SpO2: 100% 97%     General: Pt is alert, awake, not in acute distress Cardiovascular: RRR, S1/S2 +, no rubs, no gallops Respiratory: CTA bilaterally, no wheezing, no rhonchi Abdominal: Soft, NT, ND, bowel sounds + Extremities: no edema, no cyanosis    The results of significant diagnostics from this hospitalization (including imaging, microbiology, ancillary and laboratory) are listed below for reference.     Microbiology: Recent Results (from the past 240 hour(s))  SARS CORONAVIRUS 2 (TAT 6-24 HRS) Nasopharyngeal Nasopharyngeal Swab     Status: None   Collection Time: 08/13/20  8:22 PM   Specimen: Nasopharyngeal Swab  Result Value Ref Range Status   SARS Coronavirus 2 NEGATIVE NEGATIVE Final    Comment: (NOTE) SARS-CoV-2 target nucleic acids are NOT DETECTED.  The SARS-CoV-2 RNA is generally detectable in upper and lower respiratory specimens during the acute phase of infection.  Negative results do not preclude SARS-CoV-2 infection, do not rule out co-infections with other pathogens, and should not be used  as the sole basis for treatment or other patient management decisions. Negative results must be combined with clinical observations, patient history, and epidemiological information. The expected result is Negative.  Fact Sheet for Patients: SugarRoll.be  Fact Sheet for Healthcare Providers: https://www.woods-mathews.com/  This test is not yet approved or cleared by the [place] FDA and  has been authorized for detection and/or diagnosis of SARS-CoV-2 by FDA under an Emergency Use Authorization (EUA). This EUA will remain  in effect (meaning this test can be used) for the duration of the COVID-19 declaration under Se ction 564(b)(1) of the Act, 21 U.S.C. section 360bbb-3(b)(1), unless the authorization is terminated or revoked sooner.  Performed at [facility], [place] [address], [place], [place] [address]   MRSA PCR Screening     Status: None   Collection Time: 08/14/20  3:38 AM   Specimen: Nasopharyngeal  Result Value Ref Range Status   MRSA by PCR NEGATIVE NEGATIVE Final    Comment:        The GeneXpert MRSA Assay (FDA approved for NASAL specimens only), is one component of a comprehensive MRSA colonization surveillance program. It is not intended to diagnose MRSA infection nor to guide or monitor treatment for MRSA infections. Performed at [facility], [place] [address] [place], [place] [address]      Labs: BNP (last 3 results) Recent Labs    08/13/20 1618  BNP 482.7*   Basic Metabolic Panel: Recent Labs  Lab 08/15/20 0239 08/16/20 0014 08/17/20 0400 08/18/20 0222 08/19/20 0433 08/20/20 1155  NA 137 134* 135 135 136 134*  K 4.7 4.7 4.6 4.2 4.1 4.1  CL 108 106 104 105 107 104  CO2 18* 16* 21* 19* 19* 19*  GLUCOSE 115* 121* 109* 170* 115* 105*  BUN 67* 67*  67* 66* 60* 57*  CREATININE 4.40* 4.78* 4.79* 4.92* 4.55* 4.31*  CALCIUM 8.7* 8.4* 8.7* 8.6* 8.6* 8.2*  MG 2.0  --   --   --   --   --   PHOS 6.0*  --   --   --   --   --    Liver Function Tests: Recent Labs  Lab 08/15/20 0239  AST 9*  ALT 11  ALKPHOS 51  BILITOT 0.5  PROT 6.5  ALBUMIN 3.2*   No results for input(s): LIPASE, AMYLASE in the last 168 hours. No results for input(s): AMMONIA in the last 168 hours. CBC: Recent Labs  Lab 08/15/20 0239 08/19/20 0433 08/20/20 1155  WBC 9.1 6.4 6.0  NEUTROABS 6.4  --   --   HGB 9.4* 8.8* 8.6*  HCT 29.2* 25.5* 26.1*  MCV 89.8 85.9 86.4  PLT 139* 156 161   Cardiac Enzymes: No results for input(s): CKTOTAL, CKMB, CKMBINDEX, TROPONINI in the last 168 hours. BNP: Invalid input(s): POCBNP CBG: Recent Labs  Lab 08/14/20 1625 08/14/20 2040 08/15/20 0001 08/15/20 0807  GLUCAP 118* 176* 113* 103*   D-Dimer No results for input(s): DDIMER in the last 72 hours. Hgb A1c No results for input(s): HGBA1C in the last 72 hours. Lipid Profile No results for input(s): CHOL, HDL, LDLCALC, TRIG, CHOLHDL, LDLDIRECT in the last 72 hours. Thyroid function studies No results for input(s): TSH, T4TOTAL, T3FREE, THYROIDAB in the last 72 hours.  Invalid input(s): FREET3 Anemia work up No results for input(s): VITAMINB12, FOLATE, FERRITIN, TIBC, IRON, RETICCTPCT in the last 72 hours. Urinalysis    Component Value Date/Time   COLORURINE YELLOW 08/20/2020 1700  APPEARANCEUR HAZY (A) 08/20/2020 1700   LABSPEC 1.014 08/20/2020 1700   PHURINE 5.0 08/20/2020 1700   GLUCOSEU NEGATIVE 08/20/2020 1700   HGBUR NEGATIVE 08/20/2020 1700   BILIRUBINUR NEGATIVE 08/20/2020 1700   KETONESUR NEGATIVE 08/20/2020 1700   PROTEINUR >=300 (A) 08/20/2020 1700   NITRITE NEGATIVE 08/20/2020 1700   LEUKOCYTESUR NEGATIVE 08/20/2020 1700   Sepsis Labs Invalid input(s): PROCALCITONIN,  WBC,  LACTICIDVEN Microbiology Recent Results (from the past 240 hour(s))   SARS CORONAVIRUS 2 (TAT 6-24 HRS) Nasopharyngeal Nasopharyngeal Swab     Status: None   Collection Time: 08/13/20  8:22 PM   Specimen: Nasopharyngeal Swab  Result Value Ref Range Status   SARS Coronavirus 2 NEGATIVE NEGATIVE Final    Comment: (NOTE) SARS-CoV-2 target nucleic acids are NOT DETECTED.  The SARS-CoV-2 RNA is generally detectable in upper and lower respiratory specimens during the acute phase of infection. Negative results do not preclude SARS-CoV-2 infection, do not rule out co-infections with other pathogens, and should not be used as the sole basis for treatment or other patient management decisions. Negative results must be combined with clinical observations, patient history, and epidemiological information. The expected result is Negative.  Fact Sheet for Patients: SugarRoll.be  Fact Sheet for Healthcare Providers: https://www.woods-mathews.com/  This test is not yet approved or cleared by the [place] FDA and  has been authorized for detection and/or diagnosis of SARS-CoV-2 by FDA under an Emergency Use Authorization (EUA). This EUA will remain  in effect (meaning this test can be used) for the duration of the COVID-19 declaration under Se ction 564(b)(1) of the Act, 21 U.S.C. section 360bbb-3(b)(1), unless the authorization is terminated or revoked sooner.  Performed at [facility], [place] [address] [place], [place] [address]   MRSA PCR Screening     Status: None   Collection Time: 08/14/20  3:38 AM   Specimen: Nasopharyngeal  Result Value Ref Range Status   MRSA by PCR NEGATIVE NEGATIVE Final    Comment:        The GeneXpert MRSA Assay (FDA approved for NASAL specimens only), is one component of a comprehensive MRSA colonization surveillance program. It is not intended to diagnose MRSA infection nor to guide or monitor treatment for MRSA infections. Performed at [facility],  [place] [address] [place], [place] [address]      Time coordinating discharge: 40 minutes  SIGNED:   Elmarie Shiley, MD  [facility]

## 2020-08-22 ENCOUNTER — Inpatient Hospital Stay (HOSPITAL_COMMUNITY): Payer: Medicaid Other

## 2020-08-22 IMAGING — DX DG CHEST 1V PORT
1 series · 1 of 1 positions shown · non-contrast
Comparison: [DATE]

CLINICAL DATA: Respiratory failure, hypoxia

EXAM:
PORTABLE CHEST 1 VIEW

[chest ap]
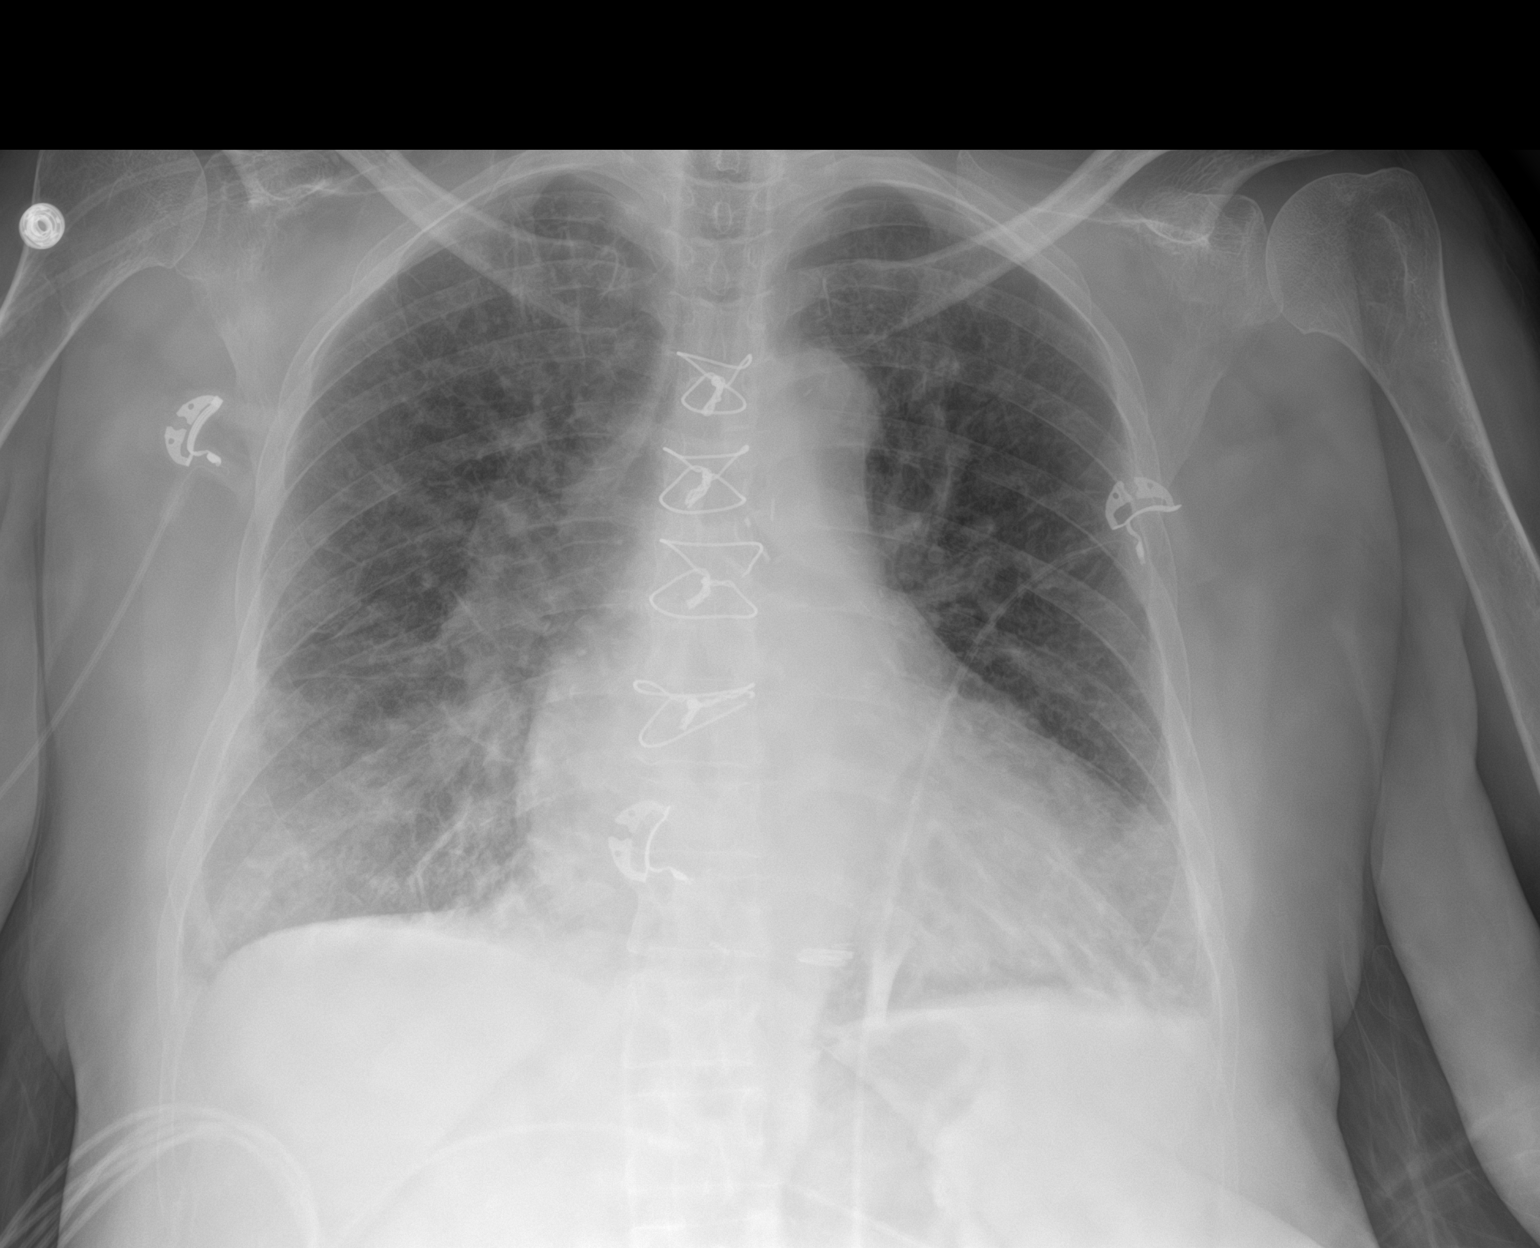

[1 of 1 positions shown; findings below may reference images not displayed]

FINDINGS: Single frontal view of the chest demonstrates stable enlargement of
the cardiac silhouette. There is increased vascular congestion, with
increased interstitial prominence and trace bilateral pleural
effusions. No focal consolidation. No pneumothorax. No acute bony
abnormalities.
IMPRESSION: 1. Findings consistent with mild fluid overload and interstitial
edema.

## 2020-08-22 MED ORDER — FUROSEMIDE 10 MG/ML IJ SOLN
40.0000 mg | Freq: Once | INTRAMUSCULAR | Status: AC
  Administered 2020-08-22: 40 mg via INTRAVENOUS
  Filled 2020-08-22: qty 4

## 2020-08-22 NOTE — Progress Notes (Signed)
Patient seen and examined, no events overnight, no changes from discharge summary as noted by Dr. Tyrell Antonio yesterday -Awaiting SNF for rehab -Discussed with social work team  Christina Polite, MD

## 2020-08-22 NOTE — TOC Progression Note (Signed)
Transition of Care [facility]) - Progression Note    Patient Details  Name: Christina Nunez MRN: [id number] Date of Birth: 10-21-1955  Transition of Care [facility]) CM/SW Contact  Christina Salina Mila Homer, LCSW Phone Number: 08/22/2020, 12:35 PM  Clinical Narrative:  Visited with patient regarding SNF discharge and she asked that her sister Christina Nunez [phone]) be contacted. [name] was contacted while in room with patient. Informed patient and sister that [facility] unable to accept patient, however [facility] may be able to accept her and sister expressed agreement.    Call made to [facility] with [facility] and advised her that patient/sister agreeable to [place]. Per Christina Nunez the business office will do a financial review and then authorization will be started with patient's Medicaid plan. She will also ensure they have an unvaccinated bed. Christina Nunez advised that patient has had one Moderna vaccination and she has to be fully vaccinated to a vaccinated bed. Per sister patient received her first vaccination on 12/31. Sister was contacted and updated.    Expected Discharge Plan: Skilled Nursing Facility Barriers to Discharge: Insurance Authorization  Expected Discharge Plan and Services Expected Discharge Plan: [place]   Discharge Planning Services: CM Consult   Living arrangements for the past 2 months: Single Family Home Expected Discharge Date: 08/21/20                                   Social Determinants of Health (SDOH) Interventions  No SDOH interventions requested or needed at this time  Readmission Risk Interventions No flowsheet data found.

## 2020-08-22 NOTE — Plan of Care (Signed)
  Problem: Education: Goal: Ability to describe self-care measures that may prevent or decrease complications (Diabetes Survival Skills Education) will improve Outcome: Progressing   Problem: Coping: Goal: Will identify appropriate support needs Outcome: Progressing

## 2020-08-22 NOTE — Plan of Care (Signed)
  Problem: Health Behavior/Discharge Planning: Goal: Ability to manage health-related needs will improve Outcome: Progressing   Problem: Activity: Goal: Risk for activity intolerance will decrease Outcome: Progressing   Problem: Nutrition: Goal: Adequate nutrition will be maintained Outcome: Progressing   

## 2020-08-22 NOTE — Plan of Care (Signed)
  Problem: Education: Goal: Knowledge of General Education information will improve Description: Including pain rating scale, medication(s)/side effects and non-pharmacologic comfort measures Outcome: Progressing   Problem: Coping: Goal: Level of anxiety will decrease Outcome: Progressing   Problem: Safety: Goal: Ability to remain free from injury will improve Outcome: Progressing   

## 2020-08-23 ENCOUNTER — Inpatient Hospital Stay (HOSPITAL_COMMUNITY): Payer: Medicaid Other

## 2020-08-23 DIAGNOSIS — I10 Essential (primary) hypertension: Secondary | ICD-10-CM | POA: Diagnosis not present

## 2020-08-23 LAB — BASIC METABOLIC PANEL
Anion gap: 10 (ref 5–15)
BUN: 58 mg/dL — ABNORMAL HIGH (ref 8–23)
CO2: 21 mmol/L — ABNORMAL LOW (ref 22–32)
Calcium: 8.8 mg/dL — ABNORMAL LOW (ref 8.9–10.3)
Chloride: 105 mmol/L (ref 98–111)
Creatinine, Ser: 4.23 mg/dL — ABNORMAL HIGH (ref 0.44–1.00)
GFR, Estimated: 11 mL/min — ABNORMAL LOW (ref >60)
Glucose, Bld: 179 mg/dL — ABNORMAL HIGH (ref 70–99)
Potassium: 4.5 mmol/L (ref 3.5–5.1)
Sodium: 136 mmol/L (ref 135–145)

## 2020-08-23 IMAGING — DX DG CHEST 1V PORT
1 series · 1 of 1 positions shown · non-contrast
Comparison: Chest radiograph dated [DATE].

CLINICAL DATA: 64-year-old female with dyspnea.

EXAM:
PORTABLE CHEST 1 VIEW

[chest ap]
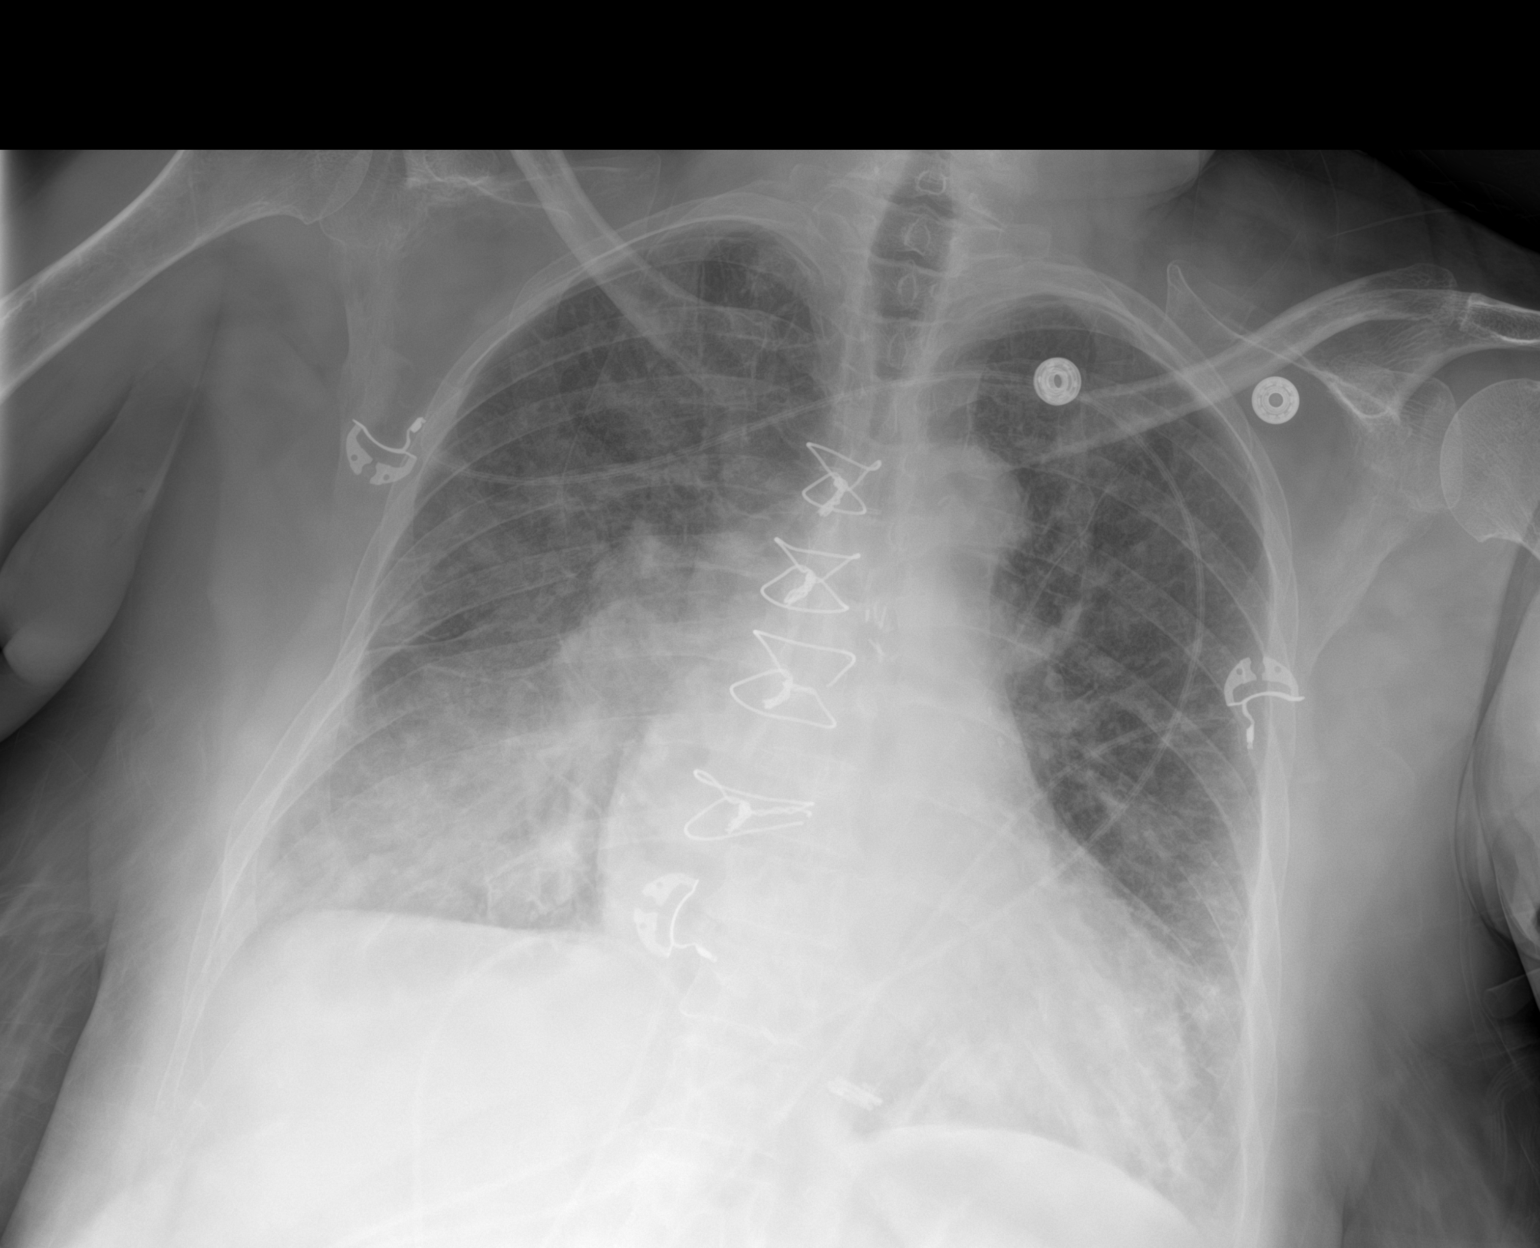

[1 of 1 positions shown; findings below may reference images not displayed]

FINDINGS: Mild cardiomegaly with vascular congestion and edema. Pneumonia is
not excluded clinical correlation is recommended probable small
bilateral pleural effusions. No pneumothorax. Median sternotomy
wires and CABG vascular clips. No acute osseous pathology.
IMPRESSION: Cardiomegaly with findings of CHF. Pneumonia is not excluded.

## 2020-08-23 MED ORDER — MORPHINE SULFATE (PF) 2 MG/ML IV SOLN
1.0000 mg | Freq: Once | INTRAVENOUS | Status: AC
Start: 2020-08-23 — End: 2020-08-23
  Administered 2020-08-23: 1 mg via INTRAVENOUS
  Filled 2020-08-23: qty 1

## 2020-08-23 MED ORDER — ALBUTEROL SULFATE (2.5 MG/3ML) 0.083% IN NEBU
2.5000 mg | INHALATION_SOLUTION | Freq: Once | RESPIRATORY_TRACT | Status: AC | PRN
  Administered 2020-08-23: 2.5 mg via RESPIRATORY_TRACT
  Filled 2020-08-23: qty 3

## 2020-08-23 MED ORDER — FUROSEMIDE 80 MG PO TABS
80.0000 mg | ORAL_TABLET | Freq: Every day | ORAL | Status: DC
  Administered 2020-08-24 – 2020-08-26 (×3): 80 mg via ORAL
  Filled 2020-08-23 (×3): qty 1

## 2020-08-23 MED ORDER — FUROSEMIDE 10 MG/ML IJ SOLN
60.0000 mg | INTRAMUSCULAR | Status: AC
  Administered 2020-08-23: 60 mg via INTRAVENOUS
  Filled 2020-08-23: qty 6

## 2020-08-23 NOTE — Progress Notes (Signed)
Patient experiencing difficulty breathing at this time. Upon assessment, oxygen sats in the 80s to low 90s, other vitals stable. Rhonchi heard in right lung lobes and breath sounds diminished in left lobes. Patient placed on 2L of oxygen and Dr. Clearence Ped notified. Orders received in Epic. Will continue to monitor.   Zanyia Silbaugh,RN.

## 2020-08-23 NOTE — Progress Notes (Signed)
PROGRESS NOTE    Christina Nunez  IDP:[id number] DOB: 27-Sep-1955 DOA: 08/13/2020 PCP: Medicine, [facility]   Brief Narrative: 65 year old with past medical history questionable for hypertension, dementia possible prior CABG presented to the ED for generalized weakness and difficulty ambulating.  Patient was found to be mildly confused.  Blood pressure was 218/109.  Patient was admitted with hypertensive urgency and renal failure with a creatinine of 3.7.  Chest x-ray showed vascular congestion and possible interstitial edema.  Patient presented to the ED with flank pain, questionable urinary retention, bladder scan only showed 30 cc.  Assessment & Plan:   Hypertensive urgency Blood pressure better controlled.  Continue with norvasc and coreg.  -add Lasix 80 mg daily  Right Centrum Semiovale infarct  Probably related to small vessel diseases.  -She was already on aspirin and Plavix prior to admission, neurology recommended to continue same MRA; no large vessel occlusion.  Bilateral ICA siphon advanced atherosclerosis left more than right moderate supraclinoid ICA stenosis.  LDL 128, HbA1c 6.3 Risk factors modifications.  ECHO; normal EF, Diastolic dysfunction mitral regurgitation. No thrombus.  -Plan for SNF for rehab  AKI, On CKD stage IV -Baseline creatinine around 2.5-3 range -Creatinine was 3.7 on admission, now stabilized in the 4.5 range -Overnight issues with mild fluid overload resolved following IV Lasix 40 mg x 1 1/6 early a.m. -Will add Lasix 80 mg daily -Discussed with nephrology again today, they will arrange close follow-up  Weakness , dizziness; Confusion.  TSH and B 12 normal.  MRI positive for stroke,. Neurology think stroke was incidental.  PT evaluation.  Needs Rehab.   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   Lower extremity pain: Doppler 12/28 -negative  GERD;  Continue with  protonix  Depression/schizophrenia Continue with prozac.   Abdominal pain; Chronic -Unremarkable thus far, denies this complaint anymore, eating and having been  Questionable history of dementia; She will need evaluation with neurology outpatient.   Covid vaccine; will be given during admission; sister agree.   Estimated body mass index is 31.91 kg/m as calculated from the following:   Height as of this encounter: 5\' 3"  (1.6 m).   Weight as of this encounter: 81.7 kg.   DVT prophylaxis: Heparin  Code Status: Full code Family Communication: care discussed with sister per patient request. 12/01 Disposition Plan:  Status is: Inpatient  Remains inpatient appropriate because:Ongoing diagnostic testing needed not appropriate for outpatient work up   Dispo: The patient is from: Home              Anticipated d/c is to: to be determine              Anticipated d/c date is: 1 day              Patient currently is medically stable to d/c. Awaiting SNF        Consultants:   Nephrology   Procedures:   Renal US, negative for hydroneprhosis   Antimicrobials:    Subjective: -Feels well this morning, overnight had increased dyspnea requiring Lasix x1   Objective: Vitals:   08/22/20 1747 08/22/20 2145 08/23/20 0540 08/23/20 0900  BP: (!) 163/93 (!) 167/88 (!) 173/87 (!) 167/83  Pulse: 66 74 70 72  Resp: 20 16    Temp: 98.3 F (36.8 C) 98.4 F (36.9 C) 98.6 F (37 C) 98.9 F (37.2 C)  TempSrc: Oral Oral Oral Oral  SpO2: 100% 100% 98%  99%  Weight:      Height:        Intake/Output Summary (Last 24 hours) at 08/23/2020 1221 Last data filed at 08/23/2020 0800 Gross per 24 hour  Intake 540 ml  Output --  Net 540 ml   Filed Weights   08/15/20 2132 08/18/20 2137 08/19/20 0500  Weight: 81.7 kg 81.7 kg 81.7 kg    Examination:  General exam: Pleasant elderly female sitting up in bed, AAOx3, no distress HEENT: No JVD CVS: S1-S2, regular rate rhythm Lungs:  Decreased breath sounds at the bases otherwise clear Abdomen: Soft, nontender, bowel sounds present Extremities: No edema   Data Reviewed: I have personally reviewed following labs and imaging studies  CBC: Recent Labs  Lab 08/19/20 0433 08/20/20 1155  WBC 6.4 6.0  HGB 8.8* 8.6*  HCT 25.5* 26.1*  MCV 85.9 86.4  PLT 156 735   Basic Metabolic Panel: Recent Labs  Lab 08/17/20 0400 08/18/20 0222 08/19/20 0433 08/20/20 1155  NA 135 135 136 134*  K 4.6 4.2 4.1 4.1  CL 104 105 107 104  CO2 21* 19* 19* 19*  GLUCOSE 109* 170* 115* 105*  BUN 67* 66* 60* 57*  CREATININE 4.79* 4.92* 4.55* 4.31*  CALCIUM 8.7* 8.6* 8.6* 8.2*   GFR: Estimated Creatinine Clearance: 13.5 mL/min (A) (by C-G formula based on SCr of 4.31 mg/dL (H)). Liver Function Tests: No results for input(s): AST, ALT, ALKPHOS, BILITOT, PROT, ALBUMIN in the last 168 hours. No results for input(s): LIPASE, AMYLASE in the last 168 hours. No results for input(s): AMMONIA in the last 168 hours. Coagulation Profile: No results for input(s): INR, PROTIME in the last 168 hours. Cardiac Enzymes: No results for input(s): CKTOTAL, CKMB, CKMBINDEX, TROPONINI in the last 168 hours. BNP (last 3 results) No results for input(s): PROBNP in the last 8760 hours. HbA1C: No results for input(s): HGBA1C in the last 72 hours. CBG: No results for input(s): GLUCAP in the last 168 hours. Lipid Profile: No results for input(s): CHOL, HDL, LDLCALC, TRIG, CHOLHDL, LDLDIRECT in the last 72 hours. Thyroid Function Tests: No results for input(s): TSH, T4TOTAL, FREET4, T3FREE, THYROIDAB in the last 72 hours. Anemia Panel: No results for input(s): VITAMINB12, FOLATE, FERRITIN, TIBC, IRON, RETICCTPCT in the last 72 hours. Sepsis Labs: No results for input(s): PROCALCITON, LATICACIDVEN in the last 168 hours.  Recent Results (from the past 240 hour(s))  SARS CORONAVIRUS 2 (TAT 6-24 HRS) Nasopharyngeal Nasopharyngeal Swab     Status: None    Collection Time: 08/13/20  8:22 PM   Specimen: Nasopharyngeal Swab  Result Value Ref Range Status   SARS Coronavirus 2 NEGATIVE NEGATIVE Final    Comment: (NOTE) SARS-CoV-2 target nucleic acids are NOT DETECTED.  The SARS-CoV-2 RNA is generally detectable in upper and lower respiratory specimens during the acute phase of infection. Negative results do not preclude SARS-CoV-2 infection, do not rule out co-infections with other pathogens, and should not be used as the sole basis for treatment or other patient management decisions. Negative results must be combined with clinical observations, patient history, and epidemiological information. The expected result is Negative.  Fact Sheet for Patients: SugarRoll.be  Fact Sheet for Healthcare Providers: https://www.woods-mathews.com/  This test is not yet approved or cleared by the [place] FDA and  has been authorized for detection and/or diagnosis of SARS-CoV-2 by FDA under an Emergency Use Authorization (EUA). This EUA will remain  in effect (meaning this test can be used) for the duration of the COVID-19  declaration under Se ction 564(b)(1) of the Act, 21 U.S.C. section 360bbb-3(b)(1), unless the authorization is terminated or revoked sooner.  Performed at [facility], [place] [address], [place], [place] [address]   MRSA PCR Screening     Status: None   Collection Time: 08/14/20  3:38 AM   Specimen: Nasopharyngeal  Result Value Ref Range Status   MRSA by PCR NEGATIVE NEGATIVE Final    Comment:        The GeneXpert MRSA Assay (FDA approved for NASAL specimens only), is one component of a comprehensive MRSA colonization surveillance program. It is not intended to diagnose MRSA infection nor to guide or monitor treatment for MRSA infections. Performed at [facility], [place] [address] [place], [place] [address]          Radiology Studies: DG CHEST  PORT 1 VIEW  Result Date: 08/22/2020 CLINICAL DATA:  Respiratory failure, hypoxia EXAM: PORTABLE CHEST 1 VIEW COMPARISON:  08/13/2020 FINDINGS: Single frontal view of the chest demonstrates stable enlargement of the cardiac silhouette. There is increased vascular congestion, with increased interstitial prominence and trace bilateral pleural effusions. No focal consolidation. No pneumothorax. No acute bony abnormalities. IMPRESSION: 1. Findings consistent with mild fluid overload and interstitial edema. Electronically Signed   By: Randa Ngo M.D.   On: 08/22/2020 22:57        Scheduled Meds: . amLODipine  2.5 mg Oral BID  . aspirin EC  81 mg Oral Daily  . atorvastatin  40 mg Oral Daily  . carvedilol  6.25 mg Oral BID WC  . Chlorhexidine Gluconate Cloth  6 each Topical Daily  . clopidogrel  75 mg Oral Daily  . FLUoxetine  80 mg Oral Daily  . heparin injection (subcutaneous)  5,000 Units Subcutaneous Q8H  . melatonin  3 mg Oral QHS  . pantoprazole  40 mg Oral Daily  . sodium bicarbonate  650 mg Oral BID   Continuous Infusions:   LOS: 9 days    Time spent:35 minutes   Domenic Polite, MD [facility]  08/23/2020, 12:21 PM

## 2020-08-23 NOTE — Plan of Care (Signed)
  Problem: Activity: Goal: Risk for activity intolerance will decrease Outcome: Progressing   Problem: Elimination: Goal: Will not experience complications related to bowel motility Outcome: Progressing   

## 2020-08-23 NOTE — Plan of Care (Signed)
  Problem: Education: Goal: Knowledge of General Education information will improve Description: Including pain rating scale, medication(s)/side effects and non-pharmacologic comfort measures Outcome: Progressing   Problem: Coping: Goal: Level of anxiety will decrease Outcome: Progressing   Problem: Pain Managment: Goal: General experience of comfort will improve Outcome: Progressing   

## 2020-08-23 NOTE — Progress Notes (Signed)
Patient's sister, Lester Dodson came to unit after visiting hours upset because she had called patient's cell phone and room phone all day, however, she kept hearing "phone was busy". Upon making inquiries from secretary, Amoria Chars, it was found that patient's sister called twice, one before shift change and another after shift change to the nurses station and the day shift nurse, updated [name] about her sister's condition. Charge Nurse, Malachy Mood, notified that the sister is in the patient's room at this time. Will continue to monitor.   Keelan Pomerleau,RN.

## 2020-08-23 NOTE — Significant Event (Signed)
Rapid Response Event Note   Reason for Call :  Respiratory Distress  Initial Focused Assessment:  Patient lying in bed with acute respiratory distress.  She is using accessory muscles to breath.  Lung sounds scattered rhonchi and crackles through out.  Heart tones regular BP 209/125  SR 87  RR 22 O2 sat 82% on [place] She is diaphoretic and anxious    Interventions:  Placed on NRB with 15L flow 1 mg Morphine IV Albuterol nebulizer PCXR done  60 mg Lasix given IV  She is starting feel better.  Lung sound more clear.   She still has mild increased work of breathing, but this is also better.  Plan of Care:  RN to wean O2 back to [place] as patient improves Call if patient doesn't continue to improve or if she has additional respiratory distress.    Event Summary:   MD Notified:   Dr Broadus John Call Time:  1840 Arrival Time: 1848 End Time:  1940  Raliegh Ip, RN

## 2020-08-24 DIAGNOSIS — I161 Hypertensive emergency: Secondary | ICD-10-CM | POA: Diagnosis not present

## 2020-08-24 LAB — BASIC METABOLIC PANEL
Anion gap: 12 (ref 5–15)
BUN: 58 mg/dL — ABNORMAL HIGH (ref 8–23)
CO2: 22 mmol/L (ref 22–32)
Calcium: 9.1 mg/dL (ref 8.9–10.3)
Chloride: 103 mmol/L (ref 98–111)
Creatinine, Ser: 4.29 mg/dL — ABNORMAL HIGH (ref 0.44–1.00)
GFR, Estimated: 11 mL/min — ABNORMAL LOW (ref >60)
Glucose, Bld: 158 mg/dL — ABNORMAL HIGH (ref 70–99)
Potassium: 4.9 mmol/L (ref 3.5–5.1)
Sodium: 137 mmol/L (ref 135–145)

## 2020-08-24 MED ORDER — ADULT MULTIVITAMIN W/MINERALS CH
1.0000 | ORAL_TABLET | Freq: Every day | ORAL | Status: DC
  Administered 2020-08-25 – 2020-08-26 (×2): 1 via ORAL
  Filled 2020-08-24 (×2): qty 1

## 2020-08-24 MED ORDER — ENSURE ENLIVE PO LIQD
237.0000 mL | Freq: Two times a day (BID) | ORAL | Status: DC
  Administered 2020-08-25 (×2): 237 mL via ORAL

## 2020-08-24 NOTE — Progress Notes (Signed)
Physical Therapy Treatment Patient Details Name: Christina Nunez MRN: [id number] DOB: [birth year] Today's Date: 08/24/2020    History of Present Illness 65 yo female presenting to ED with wekaness, imbalance, and right lower back/right sided abdominal pain. MRI on 12/30 showing right centrum semiovale, consistent with small acute/subacute infarct and lacunar infarcts in the left cerebellar hemisphere, left basal ganglia region, left thalamus and bilateral centrum semiovale and corona radiata. 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 Pt recently moved to [place] from [place]    PT Comments    Patient not progressing well towards PT goals. Requires Mod A to stand from EOB with cues for hand placement/technique and use of RW. Reports feeling like she is going to fall while sitting EOB. Able to take a few shuffling like steps to get to chair with Min A for balance and assist for RW management. Declines further mobility as pt wanting to eat lunch present in room. Noted to have cognitive deficits relating to orientation, attention and safety. VSS on 1L/min 02 [place]. Pt highly anxious when hearing alarm beeping from pulse ox machine, despite being reassured. Will continue to follow and progress as able.     Follow Up Recommendations  SNF;Supervision/Assistance - 24 hour     Equipment Recommendations  Rolling walker with 5" wheels    Recommendations for Other Services       Precautions / Restrictions Precautions Precautions: Fall Restrictions Weight Bearing Restrictions: No    Mobility  Bed Mobility Overal bed mobility: Modified Independent             General bed mobility comments: HOB elevated and use of rail to get to EOB.  Transfers Overall transfer level: Needs assistance Equipment used: Rolling walker (2 wheeled) Transfers: Sit to/from Stand Sit to Stand: Mod assist         General transfer comment:  Assist to power to standing with cues for hand placement/technique, "I feel like i am going to fall," when sitting EOB. Transferred to chair post ambulation.  Ambulation/Gait Ambulation/Gait assistance: Min assist Gait Distance (Feet): 4 Feet Assistive device: Rolling walker (2 wheeled) Gait Pattern/deviations: Shuffle;Decreased step length - right;Decreased step length - left Gait velocity: decr Gait velocity interpretation: <1.31 ft/sec, indicative of household ambulator General Gait Details: Slow shuffling like gait with RW for support; declines out of room ambulation due to wanting to eat lunch. Min A for balance/RW management.   Stairs             Wheelchair Mobility    Modified Rankin (Stroke Patients Only)       Balance Overall balance assessment: Needs assistance Sitting-balance support: Feet supported;No upper extremity supported Sitting balance-Leahy Scale: Fair     Standing balance support: During functional activity Standing balance-Leahy Scale: Poor Standing balance comment: Requires UE support in standing today.                            Cognition Arousal/Alertness: Awake/alert Behavior During Therapy: WFL for tasks assessed/performed Overall Cognitive Status: No family/caregiver present to determine baseline cognitive functioning Area of Impairment: Orientation                 Orientation Level: Disoriented to;Time Current Attention Level: Sustained   Following Commands: Follows one step commands consistently;Follows one step commands with increased time Safety/Judgement: Decreased awareness of safety;Decreased awareness of deficits   Problem Solving: Slow processing;Requires verbal cues General Comments: "  September" but able to state "January" with contextual cues. Followed simple cues well with extended time. Main focus right now is wanting to eat but agreeable to minimal mobility prior too. "i feel like I am going to fall," when  sitting EOB.      Exercises      General Comments General comments (skin integrity, edema, etc.): VSS on 1L/min 02 [place]. Gets anxious when hearing alarm beeping on pulse ox machine.      Pertinent Vitals/Pain Pain Assessment: No/denies pain    Home Living                      Prior Function            PT Goals (current goals can now be found in the care plan section) Progress towards PT goals: Not progressing toward goals - comment (self limiting due to wanting to eat lunch)    Frequency    Min 2X/week      PT Plan Current plan remains appropriate    Co-evaluation              AM-PAC PT "6 Clicks" Mobility   Outcome Measure  Help needed turning from your back to your side while in a flat bed without using bedrails?: A Little Help needed moving from lying on your back to sitting on the side of a flat bed without using bedrails?: A Little Help needed moving to and from a bed to a chair (including a wheelchair)?: A Little Help needed standing up from a chair using your arms (e.g., wheelchair or bedside chair)?: A Lot Help needed to walk in hospital room?: A Little Help needed climbing 3-5 steps with a railing? : A Lot 6 Click Score: 16    End of Session Equipment Utilized During Treatment: Gait belt Activity Tolerance: Other (comment) (self limiting due to wanting to eat lunch) Patient left: in chair;with call bell/phone within reach;with chair alarm set Nurse Communication: Mobility status PT Visit Diagnosis: Other abnormalities of gait and mobility (R26.89);Unsteadiness on feet (R26.81)     Time: 8115-7262 PT Time Calculation (min) (ACUTE ONLY): 12 min  Charges:  $Therapeutic Activity: 8-22 mins                     Marisa Severin, PT, DPT Acute Rehabilitation Services Pager [phone] Office [phone]       Marguarite Arbour A Sabra Heck 08/24/2020, 1:10 PM

## 2020-08-24 NOTE — Progress Notes (Signed)
Occupational Therapy Treatment Patient Details Name: Christina Nunez MRN: [id number] DOB: 08/16/1956 Today's Date: 08/24/2020    History of present illness 65 yo female presenting to ED with wekaness, imbalance, and right lower back/right sided abdominal pain. MRI on 12/30 showing right centrum semiovale, consistent with small acute/subacute infarct and lacunar infarcts in the left cerebellar hemisphere, left basal ganglia region, left thalamus and bilateral centrum semiovale and corona radiata. 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 Pt recently moved to [place] from [place]   OT comments  Pt. Seen for skilled OT treatment session.  Declined eob/oob states she does not feel well, somewhat described weakness but did not specify.  Reviewed benefits of ub exercise to aide in strengthening and decreased weakness.  Pt. Agreed to b ue exercises bed level.  Able to complete 3 sets of 10 of each with rest breaks in between.  Reviewed attempting to complete multiple times a day she verbalized understanding and agreed to try.    Follow Up Recommendations  SNF    Equipment Recommendations  None recommended by OT    Recommendations for Other Services      Precautions / Restrictions Precautions Precautions: Fall Precaution Comments: monitor BP Restrictions Weight Bearing Restrictions: No       Mobility Bed Mobility Overal bed mobility: Modified Independent             General bed mobility comments: declined eob/oob but agreeable to bed level therapies  Transfers Overall transfer level: Needs assistance Equipment used: Rolling walker (2 wheeled) Transfers: Sit to/from Stand Sit to Stand: Mod assist         General transfer comment: Assist to power to standing with cues for hand placement/technique, "I feel like i am going to fall," when sitting EOB. Transferred to chair post ambulation.    Balance Overall  balance assessment: Needs assistance Sitting-balance support: Feet supported;No upper extremity supported Sitting balance-Leahy Scale: Fair     Standing balance support: During functional activity Standing balance-Leahy Scale: Poor Standing balance comment: Requires UE support in standing today.                           ADL either performed or assessed with clinical judgement   ADL                                               Vision       Perception     Praxis      Cognition Arousal/Alertness: Awake/alert Behavior During Therapy: WFL for tasks assessed/performed Overall Cognitive Status: No family/caregiver present to determine baseline cognitive functioning Area of Impairment: Orientation                 Orientation Level: Disoriented to;Time Current Attention Level: Sustained   Following Commands: Follows one step commands consistently;Follows one step commands with increased time Safety/Judgement: Decreased awareness of safety;Decreased awareness of deficits   Problem Solving: Slow processing;Requires verbal cues General Comments: "September" but able to state "January" with contextual cues. Followed simple cues well with extended time. Main focus right now is wanting to eat but agreeable to minimal mobility prior too. "i feel like I am going to fall," when sitting EOB.        Exercises General Exercises - Upper Extremity Shoulder Flexion:  AAROM;10 reps;Supine Shoulder Extension: AAROM;10 reps;Supine Elbow Flexion: AAROM;10 reps;Supine Elbow Extension: AAROM;10 reps;Supine   Shoulder Instructions       [place] Comments [place], is here to help take care of grand children and be close to [place] son in [place], and her sister that lives here    Pertinent Vitals/ Pain       Pain Assessment: No/denies pain  Home Living                                          Prior Functioning/Environment               Frequency  Min 2X/week        Progress Toward Goals  OT Goals(current goals can now be found in the care plan section)  Progress towards OT goals: Progressing toward goals     Plan      Co-evaluation                 AM-PAC OT "6 Clicks" Daily Activity     Outcome Measure   Help from another person eating meals?: None Help from another person taking care of personal grooming?: None Help from another person toileting, which includes using toliet, bedpan, or urinal?: None Help from another person bathing (including washing, rinsing, drying)?: A Little Help from another person to put on and taking off regular upper body clothing?: None Help from another person to put on and taking off regular lower body clothing?: None 6 Click Score: 23    End of Session    OT Visit Diagnosis: Pain Pain - Right/Left: Right   Activity Tolerance Patient tolerated treatment well   Patient Left in bed;with call bell/phone within reach;with bed alarm set   Nurse Communication          Time: 4327-6147 OT Time Calculation (min): 9 min  Charges: OT General Charges $OT Visit: 1 Visit OT Treatments $Therapeutic Exercise: 8-22 mins  Sonia Baller, COTA/L Acute Rehabilitation [phone]   Janice Coffin 08/24/2020, 1:32 PM

## 2020-08-24 NOTE — Progress Notes (Signed)
Initial Nutrition Assessment  DOCUMENTATION CODES:   Not applicable  INTERVENTION:   Ensure Enlive po BID, each supplement provides 350 kcal and 20 grams of protein  MVI with minerals daily   NUTRITION DIAGNOSIS:   Increased nutrient needs related to acute illness as evidenced by estimated needs.    GOAL:   Patient will meet greater than or equal to 90% of their needs    MONITOR:   PO intake,Supplement acceptance,Weight trends,Labs,I & O's  REASON FOR ASSESSMENT:   Malnutrition Screening Tool    ASSESSMENT:   Pt admitted with hypertensive urgency in setting of established hypertension and found to have incidental stroke. PMH includes HTN, possible dementia, depression/anxiety, h/o ischemic heart disease s/p CABG.   Pt is pending d/c to SNF. Note pt was seen by Nephrology this admission and they are recommending close follow-up.  Pt is a poor historian. Pt's sister reports pt has had abdominal pain and nausea since 12/26. However, this has improved since admit and pt is no longer complaining of abdominal pain or poor appetite and appetite/intake has been consistently improving.  PO Intake: 0-100% x last 8 recorded meals (71% average meal intake)  No weight history available for review. Note that weight history was reported on MST, but amount/duration was not specified and RD is unable to obtain more information at this time.   UOP: 1,132ml x24 hours  Labs: Cr 4.29 (H, higher than yesterday) Medications: lasix, sodium bicarbonate  NUTRITION - FOCUSED PHYSICAL EXAM:  Unable to perform at this time. Will attempt at follow-up.   Diet Order:   Diet Order            Diet - low sodium heart healthy           Diet heart healthy/carb modified Room service appropriate? Yes; Fluid consistency: Thin; Fluid restriction: 1200 mL Fluid  Diet effective now                 EDUCATION NEEDS:   No education needs have been identified at this time  Skin:  Skin  Assessment: Reviewed RN Assessment  Last BM:  08/22/20  Height:   Ht Readings from Last 1 Encounters:  08/13/20 5\' 3"  (1.6 m)    Weight:   Wt Readings from Last 1 Encounters:  08/24/20 78.8 kg   BMI:  Body mass index is 30.77 kg/m.  Estimated Nutritional Needs:   Kcal:  1700-1900  Protein:  85-95 grams  Fluid:  >1.7L/d    Larkin Ina, MS, RD, LDN RD pager number and weekend/on-call pager number located in [place].

## 2020-08-24 NOTE — Progress Notes (Signed)
PROGRESS NOTE    Christina Nunez  NLG:[id number] DOB: [birth year] DOA: 08/13/2020 PCP: Medicine, [facility]   Brief Narrative: 65 year old with past medical history questionable for hypertension, dementia possible prior CABG presented to the ED for generalized weakness and difficulty ambulating.  Patient was found to be mildly confused.  Blood pressure was 218/109.  Patient was admitted with hypertensive urgency and renal failure with a creatinine of 3.7.  Chest x-ray showed vascular congestion and interstitial edema  Assessment & Plan:   Hypertensive urgency -Blood pressure stable, continue Coreg and Norvasc -Lasix 80 mg daily  Right Centrum Semiovale infarct  -Noted on MRI, this is felt to be related to small vessel disease -Seen by neurology this admission, she was on aspirin Plavix prior to admission, neurology recommended continue same, -MRA; no large vessel occlusion.  Bilateral ICA siphon advanced atherosclerosis left more than right moderate supraclinoid ICA stenosis.  -LDL 128, HbA1c 6.3 -Risk factors modification recommended -ECHO; normal EF, Diastolic dysfunction mitral regurgitation. No thrombus.  -Plan for SNF for rehab  AKI, On CKD stage IV Acute on chronic diastolic CHF -Baseline creatinine around 2.5-3 range -Creatinine was 3.7 on admission, now stabilized in the 4.3 range -1/5 night and 1/6 night had issues with acute dyspnea, requiring additional IV diuretics, continue Lasix 80 mg daily -Seen by nephrology this admission, recommended follow-up -Discussed with nephrology again 1/6, they will arrange close follow-up  Weakness , dizziness; Confusion.  -TSH and B 12 normal.  -MRI positive for stroke, per neuro this was likely incidental -PT eval completed, needs rehab Needs Rehab.   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   GERD;  Continue with protonix  Depression/schizophrenia Continue  with prozac.   Abdominal pain; Chronic -Unremarkable thus far, denies this complaint anymore, eating and having been  Questionable history of dementia; She will need evaluation with neurology outpatient.   Covid vaccine; will be given during admission; sister agreed  Estimated body mass index is 30.77 kg/m as calculated from the following:   Height as of this encounter: 5\' 3"  (1.6 m).   Weight as of this encounter: 78.8 kg.   DVT prophylaxis: Heparin SQ Code Status: Full code Family Communication: care discussed with sister per patient request. 12/01 Disposition Plan:  Status is: Inpatient  Remains inpatient appropriate because:Ongoing diagnostic testing needed not appropriate for outpatient work up   Dispo: The patient is from: Home              Anticipated d/c is to: SNF              Anticipated d/c date is: 48 hours              Patient currently is not medically stable to d/c. Awaiting SNF  Consultants:   Nephrology   Procedures:   Renal US, negative for hydroneprhosis   Antimicrobials:    Subjective: -Overnight developed acute onset dyspnea requiring morphine and Lasix  Objective: Vitals:   08/23/20 2132 08/24/20 0442 08/24/20 0500 08/24/20 0952  BP: (!) 157/81 (!) 160/87  (!) 145/78  Pulse: 75 69  69  Resp: 16   17  Temp: 98.5 F (36.9 C) 98.9 F (37.2 C)  99.2 F (37.3 C)  TempSrc: Oral Oral  Oral  SpO2: 93%   94%  Weight: 78.8 kg  78.8 kg   Height:        Intake/Output Summary (Last 24 hours) at 08/24/2020 1129 Last data  filed at 08/24/2020 0954 Gross per 24 hour  Intake 476 ml  Output 1250 ml  Net -774 ml   Filed Weights   08/19/20 0500 08/23/20 2132 08/24/20 0500  Weight: 81.7 kg 78.8 kg 78.8 kg    Examination:  General exam: Pleasant middle-aged female sitting up in bed, AAOx 2, flat affect CVS: S1-S2, regular rate rhythm Lungs: Few scattered basilar rales Abdomen: Soft, nontender, bowel sounds present Extremities: No edema  Data  Reviewed: I have personally reviewed following labs and imaging studies  CBC: Recent Labs  Lab 08/19/20 0433 08/20/20 1155  WBC 6.4 6.0  HGB 8.8* 8.6*  HCT 25.5* 26.1*  MCV 85.9 86.4  PLT 156 409   Basic Metabolic Panel: Recent Labs  Lab 08/18/20 0222 08/19/20 0433 08/20/20 1155 08/23/20 1036 08/24/20 0854  NA 135 136 134* 136 137  K 4.2 4.1 4.1 4.5 4.9  CL 105 107 104 105 103  CO2 19* 19* 19* 21* 22  GLUCOSE 170* 115* 105* 179* 158*  BUN 66* 60* 57* 58* 58*  CREATININE 4.92* 4.55* 4.31* 4.23* 4.29*  CALCIUM 8.6* 8.6* 8.2* 8.8* 9.1   GFR: Estimated Creatinine Clearance: 13.3 mL/min (A) (by C-G formula based on SCr of 4.29 mg/dL (H)). Liver Function Tests: No results for input(s): AST, ALT, ALKPHOS, BILITOT, PROT, ALBUMIN in the last 168 hours. No results for input(s): LIPASE, AMYLASE in the last 168 hours. No results for input(s): AMMONIA in the last 168 hours. Coagulation Profile: No results for input(s): INR, PROTIME in the last 168 hours. Cardiac Enzymes: No results for input(s): CKTOTAL, CKMB, CKMBINDEX, TROPONINI in the last 168 hours. BNP (last 3 results) No results for input(s): PROBNP in the last 8760 hours. HbA1C: No results for input(s): HGBA1C in the last 72 hours. CBG: No results for input(s): GLUCAP in the last 168 hours. Lipid Profile: No results for input(s): CHOL, HDL, LDLCALC, TRIG, CHOLHDL, LDLDIRECT in the last 72 hours. Thyroid Function Tests: No results for input(s): TSH, T4TOTAL, FREET4, T3FREE, THYROIDAB in the last 72 hours. Anemia Panel: No results for input(s): VITAMINB12, FOLATE, FERRITIN, TIBC, IRON, RETICCTPCT in the last 72 hours. Sepsis Labs: No results for input(s): PROCALCITON, LATICACIDVEN in the last 168 hours.  No results found for this or any previous visit (from the past 240 hour(s)).       Radiology Studies: DG CHEST PORT 1 VIEW  Result Date: 08/23/2020 CLINICAL DATA:  65 year old female with dyspnea. EXAM: PORTABLE  CHEST 1 VIEW COMPARISON:  Chest radiograph dated 08/22/2020. FINDINGS: Mild cardiomegaly with vascular congestion and edema. Pneumonia is not excluded clinical correlation is recommended probable small bilateral pleural effusions. No pneumothorax. Median sternotomy wires and CABG vascular clips. No acute osseous pathology. IMPRESSION: Cardiomegaly with findings of CHF. Pneumonia is not excluded. Electronically Signed   By: Anner Crete M.D.   On: 08/23/2020 19:06   DG CHEST PORT 1 VIEW  Result Date: 08/22/2020 CLINICAL DATA:  Respiratory failure, hypoxia EXAM: PORTABLE CHEST 1 VIEW COMPARISON:  08/13/2020 FINDINGS: Single frontal view of the chest demonstrates stable enlargement of the cardiac silhouette. There is increased vascular congestion, with increased interstitial prominence and trace bilateral pleural effusions. No focal consolidation. No pneumothorax. No acute bony abnormalities. IMPRESSION: 1. Findings consistent with mild fluid overload and interstitial edema. Electronically Signed   By: Randa Ngo M.D.   On: 08/22/2020 22:57        Scheduled Meds: . amLODipine  2.5 mg Oral BID  . aspirin EC  81 mg  Oral Daily  . atorvastatin  40 mg Oral Daily  . carvedilol  6.25 mg Oral BID WC  . Chlorhexidine Gluconate Cloth  6 each Topical Daily  . clopidogrel  75 mg Oral Daily  . FLUoxetine  80 mg Oral Daily  . furosemide  80 mg Oral Daily  . heparin injection (subcutaneous)  5,000 Units Subcutaneous Q8H  . melatonin  3 mg Oral QHS  . pantoprazole  40 mg Oral Daily  . sodium bicarbonate  650 mg Oral BID   Continuous Infusions:   LOS: 10 days    Time spent:25 minutes   Domenic Polite, MD [facility]  08/24/2020, 11:29 AM

## 2020-08-24 NOTE — TOC Progression Note (Signed)
Transition of Care [facility]) - Progression Note    Patient Details  Name: Rozalia Dino MRN: [id number] Date of Birth: 05/29/1956  Transition of Care [facility]) CM/SW Contact  Sharlet Salina Mila Homer, LCSW Phone Number: 08/24/2020, 6:46 PM  Clinical Narrative:   Received call from Ebony Hail, admissions director at [facility]. She received insurance auth for patient and she can discharge to [facility] Saturday. Call made to sister Alyssa Grove [phone]) and message left regarding Saturday discharge.l    Expected Discharge Plan: [place] Barriers to Discharge: Insurance Authorization  Expected Discharge Plan and Services Expected Discharge Plan: [place]   Discharge Planning Services: CM Consult   Living arrangements for the past 2 months: Single Family Home Expected Discharge Date: 08/21/20                                     Social Determinants of Health (SDOH) Interventions  No SDOH interventions needed at this time  Readmission Risk Interventions No flowsheet data found.

## 2020-08-25 DIAGNOSIS — I161 Hypertensive emergency: Secondary | ICD-10-CM | POA: Diagnosis not present

## 2020-08-25 LAB — BASIC METABOLIC PANEL
Anion gap: 12 (ref 5–15)
BUN: 61 mg/dL — ABNORMAL HIGH (ref 8–23)
CO2: 21 mmol/L — ABNORMAL LOW (ref 22–32)
Calcium: 8.9 mg/dL (ref 8.9–10.3)
Chloride: 102 mmol/L (ref 98–111)
Creatinine, Ser: 4.16 mg/dL — ABNORMAL HIGH (ref 0.44–1.00)
GFR, Estimated: 11 mL/min — ABNORMAL LOW (ref >60)
Glucose, Bld: 174 mg/dL — ABNORMAL HIGH (ref 70–99)
Potassium: 4.3 mmol/L (ref 3.5–5.1)
Sodium: 135 mmol/L (ref 135–145)

## 2020-08-25 LAB — CBC
HCT: 27.1 % — ABNORMAL LOW (ref 36.0–46.0)
Hemoglobin: 8.8 g/dL — ABNORMAL LOW (ref 12.0–15.0)
MCH: 28.3 pg (ref 26.0–34.0)
MCHC: 32.5 g/dL (ref 30.0–36.0)
MCV: 87.1 fL (ref 80.0–100.0)
Platelets: 180 10*3/uL (ref 150–400)
RBC: 3.11 MIL/uL — ABNORMAL LOW (ref 3.87–5.11)
RDW: 14.8 % (ref 11.5–15.5)
WBC: 11.1 10*3/uL — ABNORMAL HIGH (ref 4.0–10.5)
nRBC: 0 % (ref 0.0–0.2)

## 2020-08-25 LAB — SARS CORONAVIRUS 2 (TAT 6-24 HRS): SARS Coronavirus 2: NEGATIVE

## 2020-08-25 MED ORDER — FUROSEMIDE 80 MG PO TABS: 80.0000 mg | ORAL_TABLET | Freq: Every day | ORAL | Status: DC

## 2020-08-25 NOTE — Plan of Care (Signed)

## 2020-08-25 NOTE — Progress Notes (Signed)
Stroke education done was explained the Face, Arms Speech and Time she voiced understanding to get to hospital as soon as possible also unrelieved headaches blurred vision and unsteady walking. Explained th Stroke book valuable information for education. Arthor Captain LPN

## 2020-08-25 NOTE — Discharge Summary (Addendum)
Physician Discharge Summary  Christina Nunez CNO:[id number] DOB: 1956-05-21 DOA: 08/13/2020  PCP: Medicine, [place] date: 08/13/2020 Discharge date: 08/26/2020  Time spent: 35 minutes  Recommendations for Outpatient Follow-up:  [facility] Dr. Osborne Casco in 1 to 2 weeks, titrate diuretics PCP in 1 week [place] neurolog[facility] Dr. Leonie Man in 4 weeks  Discharge Diagnoses:  Principal Problem:   Hypertensive emergency   AKi on Chronic kidney disease stage IV   Essential hypertension   Memory deficits   AKI (acute kidney injury) ([place])   History of ischemic heart disease   Depression   Hypertensive urgency   Acute ischemic stroke [facility])   Discharge Condition: Stable  Diet recommendation: Strict low-sodium, renal diet  Filed Weights   08/23/20 2132 08/24/20 0500 08/24/20 2029  Weight: 78.8 kg 78.8 kg 77.3 kg    History of present illness:  65 year old with past medical history questionable for hypertension, dementia possible prior CABG presented to the ED for generalized weakness and difficulty ambulating.  Patient was found to be mildly confused.  Blood pressure was 218/109.  Patient was admitted with hypertensive urgency and renal failure with a creatinine of 3.7.  Chest x-ray showed vascular congestion and interstitial edema  Hospital Course:   Hypertensive urgency -Blood pressures were in the 200 range on admission in the setting of volume overload as well, improved and stabilized with diuretics, now continue Coreg, Norvasc and Lasix 80 mg daily  Right Centrum Semiovale infarct  -Presented to the ED with generalized weakness and gait, balance issues, MRI brain noted right central semiovale infarct, this is felt to be related to small vessel disease -Seen by neurology this admission, she was on aspirin Plavix prior to admission, neurology recommended continue same, -MRA; no large vessel occlusion.  Bilateral ICA siphon advanced atherosclerosis  left more than right moderate supraclinoid ICA stenosis.  -LDL 128, HbA1c 6.3 -Risk factors modification recommended -ECHO; normal EF, Diastolic dysfunction mitral regurgitation. No thrombus.  -Plan for SNF for rehab  AKI, On CKD stage IV Acute on chronic diastolic CHF -Baseline creatinine around 2.5-3 range from [place] -Creatinine was 3.7 on admission, now stabilized in the 4.2 range, briefly required IV diuretics earlier this admission and then was discontinued subsequently on 1/5 overnight had acute onset dyspnea with fluid overload on x-ray requiring additional IV diuretics -subsequently transitioned to Lasix 80 mg daily -Seen by nephrology this admission, recommended close follow-up -Discussed with nephrology again 1/6, they will arrange close follow-up  Weakness , dizziness; Confusion.  -TSH and B 12 normal.  -MRI positive for stroke, per neuro this was likely incidental -PT eval completed, needs rehab Needs Rehab.   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   GERD;  Continue with protonix  Depression/schizophrenia Continue with prozac.   Abdominal pain; Chronic -Unremarkable thus far, denies this complaint anymore, eating and having BMs  Memory deficits -Cannot rule out a component of vascular dementia, otherwise is awake alert oriented x2   Discharge Exam: Vitals:   08/25/20 0453 08/25/20 0927  BP: (!) 171/87 (!) 157/80  Pulse: 65 67  Resp: 16 16  Temp: 98 F (36.7 C) 99.4 F (37.4 C)  SpO2: 98% 100%    General: Awake alert oriented x2, memory and cognitive deficits noted Cardiovascular: S1-S2, regular rate rhythm Respiratory: Clear  Discharge Instructions   Discharge Instructions    Ambulatory referral to Neurology   Complete by: As directed    Follow  up with Dr. Leonie Man at [facility] in 4-6 weeks. Too complicated for RN to follow. Thanks.   Diet - low sodium heart healthy   Complete by: As  directed    Diet - low sodium heart healthy   Complete by: As directed    Increase activity slowly   Complete by: As directed    Increase activity slowly   Complete by: As directed      Allergies as of 08/25/2020   No Known Allergies     Medication List    TAKE these medications   amLODipine 2.5 MG tablet Commonly known as: NORVASC Take 1 tablet (2.5 mg total) by mouth 2 (two) times daily. What changed:   medication strength  how much to take  when to take this   aspirin EC 81 MG tablet Take 81 mg by mouth daily. Swallow whole.   atorvastatin 40 MG tablet Commonly known as: LIPITOR Take 1 tablet (40 mg total) by mouth daily.   carvedilol 6.25 MG tablet Commonly known as: COREG Take 1 tablet (6.25 mg total) by mouth 2 (two) times daily with a meal.   clopidogrel 75 MG tablet Commonly known as: PLAVIX Take 75 mg by mouth daily.   FLUoxetine 40 MG capsule Commonly known as: PROZAC Take 80 mg by mouth daily.   furosemide 80 MG tablet Commonly known as: LASIX Take 1 tablet (80 mg total) by mouth daily. What changed: when to take this   pantoprazole 40 MG tablet Commonly known as: PROTONIX Take 1 tablet (40 mg total) by mouth daily.   sodium bicarbonate 650 MG tablet Take 1 tablet (650 mg total) by mouth 2 (two) times daily.   Vitamin D (Ergocalciferol) 1.25 MG (50000 UNIT) Caps capsule Commonly known as: DRISDOL Take 50,000 Units by mouth every 7 (seven) days.      No Known Allergies  Follow-up Information    Garvin Fila, MD. Schedule an appointment as soon as possible for a visit in 4 week(s).   Specialties: Neurology, Radiology Contact information: [address] [place] [address] [phone]        Reesa Chew, MD Follow up in 1 week(s).   Specialty: Internal Medicine Why: call office to schedule appointment.  Contact information: [place] [address] [phone]                The results  of significant diagnostics from this hospitalization (including imaging, microbiology, ancillary and laboratory) are listed below for reference.    Significant Diagnostic Studies: DG Abd 1 View  Result Date: 08/17/2020 CLINICAL DATA:  Abdominal pain EXAM: ABDOMEN - 1 VIEW COMPARISON:  08/13/2020 CT abdomen/pelvis FINDINGS: No disproportionately dilated small bowel loops. No evidence of pneumatosis or pneumoperitoneum. Moderate stool and gas throughout the large bowel. No radiopaque nephrolithiasis. Stable tiny calcified right pelvic uterine fibroid as seen on recent CT. Clear lung bases. IMPRESSION: Nonobstructive bowel gas pattern. Moderate colonic stool and gas, which may indicate constipation. Electronically Signed   By: Ilona Sorrel M.D.   On: 08/17/2020 12:55   CT Head Wo Contrast  Result Date: 08/13/2020 CLINICAL DATA:  Encephalopathy EXAM: CT HEAD WITHOUT CONTRAST TECHNIQUE: Contiguous axial images were obtained from the base of the skull through the vertex without intravenous contrast. COMPARISON:  None. FINDINGS: Brain: There is no mass, hemorrhage or extra-axial collection. The size and configuration of the ventricles and extra-axial CSF spaces are normal. There is hypoattenuation of the white matter, most commonly indicating  chronic small vessel disease. Vascular: No abnormal hyperdensity of the major intracranial arteries or dural venous sinuses. No intracranial atherosclerosis. Skull: The visualized skull base, calvarium and extracranial soft tissues are normal. Sinuses/Orbits: No fluid levels or advanced mucosal thickening of the visualized paranasal sinuses. No mastoid or middle ear effusion. The orbits are normal. IMPRESSION: Chronic small vessel disease without acute intracranial abnormality. Electronically Signed   By: Ulyses Jarred M.D.   On: 08/13/2020 23:24   MR ANGIO HEAD WO CONTRAST  Result Date: 08/17/2020 CLINICAL DATA:  65 year old female with recent delirium common  cephalopathy. Small acute white matter infarct in the anterior left centrum semiovale on MRI yesterday. EXAM: MRA HEAD WITHOUT CONTRAST TECHNIQUE: Angiographic images of the Circle of Willis were obtained using MRA technique without intravenous contrast. COMPARISON:  Brain MRI yesterday.  Neck MRA today. FINDINGS: Antegrade flow in the posterior circulation with codominant distal vertebral arteries. No distal vertebral stenosis. Normal left PICA origin. Dominant appearing right AICA is patent. Patent vertebrobasilar junction, basilar artery, bilateral AICA and SCA origins. Mild basilar tortuosity and irregularity without significant stenosis. Fetal type bilateral PCA origins, both P1 segments are present and tortuous. Mild bilateral PCA P2 segment irregularity. No significant PCA stenosis. Antegrade flow in both ICA siphons. Tortuous right ICA below the skull base. Evidence of significant bilateral siphon atherosclerosis from the distal cavernous through the supraclinoid segments. Moderate left siphon stenosis at the anterior genu and the supraclinoid segment distal to the ophthalmic artery. Mild to moderate similar right ICA siphon stenosis. Ophthalmic artery and posterior communicating artery origins are normal. Patent carotid termini. Patent MCA and ACA origins. Mild stenosis at both ACA origins greater on the left. Diminutive or absent anterior communicating artery. Bilateral ACA branches are within normal limits. Both MCA M1 segments and MCA bifurcations are patent without stenosis. Visible bilateral MCA branches are within normal limits. IMPRESSION: 1. No large vessel occlusion. 2. Evidence of advanced atherosclerosis of both ICA siphons, with up to moderate supraclinoid ICA stenosis greater on the left. 3. Only mild stenoses elsewhere in the anterior and posterior circulation. Electronically Signed   By: Genevie Ann M.D.   On: 08/17/2020 07:28   MR ANGIO NECK WO CONTRAST  Result Date: 08/17/2020 CLINICAL  DATA:  65 year old female with recent delirium common cephalopathy. Small acute white matter infarct in the anterior left centrum semiovale on MRI yesterday. EXAM: MRA NECK WITHOUT CONTRAST TECHNIQUE: Angiographic images of the neck were obtained using MRA technique without intravenous contrast. Carotid stenosis measurements (when applicable) are obtained utilizing NASCET criteria, using the distal internal carotid diameter as the denominator. COMPARISON:  None. FINDINGS: 2D and 3D time-of-flight MRA images of the neck. Antegrade flow in bilateral cervical carotid and vertebral arteries to the skull base. Three vessel arch configuration. Mildly tortuous bilateral CCA is. Carotid bifurcations appear patent and within normal limits, without evidence of significant stenosis. Tortuous right ICA distal to the bulb. Codominant vertebral arteries appear patent to the skull base with V2 and V3 tortuosity but no evidence of stenosis. IMPRESSION: Negative noncontrast neck MRA. Electronically Signed   By: Genevie Ann M.D.   On: 08/17/2020 07:18   MR BRAIN WO CONTRAST  Result Date: 08/16/2020 CLINICAL DATA:  Delirium. EXAM: MRI HEAD WITHOUT CONTRAST TECHNIQUE: Multiplanar, multiecho pulse sequences of the brain and surrounding structures were obtained without intravenous contrast. COMPARISON:  Head CT August 13, 2020. FINDINGS: Brain: Linear focus of restricted diffusion within the right centrum semiovale, consistent with acute/subacute infarct. Remote lacunar infarcts  in the left cerebellar hemisphere, left basal ganglia region, left thalamus and bilateral centrum semiovale and corona radiata. Scattered foci of T2 hyperintensity are seen within the white matter of the cerebral hemispheres and within the pons, nonspecific, most likely related to chronic microangiopathic changes. No hemorrhage, hydrocephalus, extra-axial collection or mass lesion. Vascular: Normal flow voids. Skull and upper cervical spine: Normal marrow  signal. Sinuses/Orbits: Negative. IMPRESSION: 1. Linear focus of restricted diffusion within the right centrum semiovale, consistent with small acute/subacute infarct. 2. Remote lacunar infarcts in the left cerebellar hemisphere, left basal ganglia region, left thalamus and bilateral centrum semiovale and corona radiata. 3. Mild chronic microangiopathic changes. Electronically Signed   By: Pedro Earls M.D.   On: 08/16/2020 20:40   US RENAL  Result Date: 08/15/2020 CLINICAL DATA:  Evaluate etiology of renal failure. EXAM: RENAL / URINARY TRACT ULTRASOUND COMPLETE COMPARISON:  08/13/2020 FINDINGS: Right Kidney: Renal measurements: 10.4 x 4.9 x 5.9 cm = volume: 158 mL. Echogenicity within normal limits. No mass or hydronephrosis visualized. Left Kidney: Renal measurements: 10.0 x 5.3 x 5.5 cm = volume: 152 mL. Echogenicity within normal limits. No mass or hydronephrosis visualized. Bladder: Appears normal for degree of bladder distention. Other: None. IMPRESSION: 1. Normal renal sonogram. Electronically Signed   By: Kerby Moors M.D.   On: 08/15/2020 16:51   DG CHEST PORT 1 VIEW  Result Date: 08/23/2020 CLINICAL DATA:  65 year old female with dyspnea. EXAM: PORTABLE CHEST 1 VIEW COMPARISON:  Chest radiograph dated 08/22/2020. FINDINGS: Mild cardiomegaly with vascular congestion and edema. Pneumonia is not excluded clinical correlation is recommended probable small bilateral pleural effusions. No pneumothorax. Median sternotomy wires and CABG vascular clips. No acute osseous pathology. IMPRESSION: Cardiomegaly with findings of CHF. Pneumonia is not excluded. Electronically Signed   By: Anner Crete M.D.   On: 08/23/2020 19:06   DG CHEST PORT 1 VIEW  Result Date: 08/22/2020 CLINICAL DATA:  Respiratory failure, hypoxia EXAM: PORTABLE CHEST 1 VIEW COMPARISON:  08/13/2020 FINDINGS: Single frontal view of the chest demonstrates stable enlargement of the cardiac silhouette. There is  increased vascular congestion, with increased interstitial prominence and trace bilateral pleural effusions. No focal consolidation. No pneumothorax. No acute bony abnormalities. IMPRESSION: 1. Findings consistent with mild fluid overload and interstitial edema. Electronically Signed   By: Randa Ngo M.D.   On: 08/22/2020 22:57   DG Chest Portable 1 View  Result Date: 08/13/2020 CLINICAL DATA:  Abdomen pain hypertension EXAM: PORTABLE CHEST 1 VIEW COMPARISON:  None. FINDINGS: Post sternotomy changes. Cardiomegaly with vascular congestion and mild diffuse interstitial opacity, likely edema. No pneumothorax. IMPRESSION: Cardiomegaly with vascular congestion and probable mild interstitial edema. Electronically Signed   By: Donavan Foil M.D.   On: 08/13/2020 21:32   ECHOCARDIOGRAM COMPLETE  Result Date: 08/14/2020    ECHOCARDIOGRAM REPORT   Patient Name:   Christina Nunez Date of Exam: 08/14/2020 Medical Rec #:  [id number]    Height:       63.0 in Accession #:    [id number]   Weight:       180.0 lb Date of Birth:  05/02/1956    BSA:          1.849 m Patient Age:    23 years     BP:           154/87 mmHg Patient Gender: F            HR:           72  bpm. Exam Location:  Inpatient Procedure: 2D Echo Indications:    Congestive Heart Failure  History:        Patient has no prior history of Echocardiogram examinations.  Sonographer:    Mikki Santee RDCS (AE) Referring Phys: [id number] AMY N [facility] IMPRESSIONS  1. Left ventricular ejection fraction, by estimation, is 50 to 55%. The left ventricle has low normal function. The left ventricle demonstrates regional wall motion abnormalities (see scoring diagram/findings for description). There is mild concentric left ventricular hypertrophy. Left ventricular diastolic parameters are consistent with Grade II diastolic dysfunction (pseudonormalization). Elevated left atrial pressure. There is mild hypokinesis of the left ventricular, basal-mid inferior wall and  inferoseptal wall.  2. Right ventricular systolic function is normal. The right ventricular size is normal. There is normal pulmonary artery systolic pressure.  3. Left atrial size was mildly dilated.  4. The mitral valve is normal in structure. Moderate mitral valve regurgitation.  5. The aortic valve is tricuspid. Aortic valve regurgitation is not visualized. No aortic stenosis is present.  6. The inferior vena cava is normal in size with greater than 50% respiratory variability, suggesting right atrial pressure of 3 mmHg. Comparison(s): No prior Echocardiogram. FINDINGS  Left Ventricle: Left ventricular ejection fraction, by estimation, is 50 to 55%. The left ventricle has low normal function. The left ventricle demonstrates regional wall motion abnormalities. Mild hypokinesis of the left ventricular, basal-mid inferior  wall and inferoseptal wall. The left ventricular internal cavity size was normal in size. There is mild concentric left ventricular hypertrophy. Abnormal (paradoxical) septal motion consistent with post-operative status. Left ventricular diastolic parameters are consistent with Grade II diastolic dysfunction (pseudonormalization). Elevated left atrial pressure. Right Ventricle: The right ventricular size is normal. No increase in right ventricular wall thickness. Right ventricular systolic function is normal. There is normal pulmonary artery systolic pressure. The tricuspid regurgitant velocity is 1.88 m/s, and  with an assumed right atrial pressure of 3 mmHg, the estimated right ventricular systolic pressure is 54.6 mmHg. Left Atrium: Left atrial size was mildly dilated. Right Atrium: Right atrial size was normal in size. Pericardium: There is no evidence of pericardial effusion. Mitral Valve: The mitral valve is normal in structure. Moderate mitral valve regurgitation, with centrally-directed jet. Tricuspid Valve: The tricuspid valve is normal in structure. Tricuspid valve regurgitation is  trivial. Aortic Valve: The aortic valve is tricuspid. Aortic valve regurgitation is not visualized. No aortic stenosis is present. Pulmonic Valve: The pulmonic valve was normal in structure. Pulmonic valve regurgitation is not visualized. Aorta: The aortic root and ascending aorta are structurally normal, with no evidence of dilitation. Venous: The inferior vena cava is normal in size with greater than 50% respiratory variability, suggesting right atrial pressure of 3 mmHg. IAS/Shunts: No atrial level shunt detected by color flow Doppler.  LEFT VENTRICLE PLAX 2D LVIDd:         4.30 cm  Diastology LVIDs:         3.20 cm  LV e' medial:    5.03 cm/s LV PW:         1.30 cm  LV E/e' medial:  20.3 LV IVS:        1.20 cm  LV e' lateral:   4.54 cm/s LVOT diam:     2.20 cm  LV E/e' lateral: 22.5 LV SV:         62 LV SV Index:   34 LVOT Area:     3.80 cm  LEFT ATRIUM  Index       RIGHT ATRIUM           Index LA diam:        3.70 cm 2.00 cm/m  RA Area:     11.40 cm LA Vol (A2C):   72.4 ml 39.16 ml/m RA Volume:   19.80 ml  10.71 ml/m LA Vol (A4C):   59.4 ml 32.13 ml/m LA Biplane Vol: 67.2 ml 36.34 ml/m  AORTIC VALVE LVOT Vmax:   69.80 cm/s LVOT Vmean:  47.800 cm/s LVOT VTI:    0.163 m  AORTA Ao Root diam: 3.00 cm MITRAL VALVE                 TRICUSPID VALVE MV Area (PHT): 4.15 cm      TR Peak grad:   14.1 mmHg MV Decel Time: 183 msec      TR Vmax:        188.00 cm/s MR Peak grad:    96.0 mmHg MR Mean grad:    63.0 mmHg   SHUNTS MR Vmax:         490.00 cm/s Systemic VTI:  0.16 m MR Vmean:        380.0 cm/s  Systemic Diam: 2.20 cm MR PISA:         2.26 cm MR PISA Eff ROA: 18 mm MR PISA Radius:  0.60 cm MV E velocity: 102.00 cm/s MV A velocity: 80.60 cm/s MV E/A ratio:  1.27 Mihai Croitoru MD Electronically signed by Sanda Klein MD Signature Date/Time: 08/14/2020/11:49:55 AM    Final    CT Renal Stone Study  Result Date: 08/13/2020 CLINICAL DATA:  Flank pain, urinary retention EXAM: CT ABDOMEN AND  PELVIS WITHOUT CONTRAST TECHNIQUE: Multidetector CT imaging of the abdomen and pelvis was performed following the standard protocol without IV contrast. COMPARISON:  None. FINDINGS: Lower chest: There is a trace right pleural effusion. Patchy areas of consolidation at the lung bases likely reflect atelectasis. No pericardial effusion. Hepatobiliary: Small gallstones are seen layering dependently in the gallbladder. No evidence of acute cholecystitis. Unenhanced imaging of the liver is unremarkable. No biliary dilation. Pancreas: Unremarkable. No pancreatic ductal dilatation or surrounding inflammatory changes. Spleen: Normal in size without focal abnormality. Adrenals/Urinary Tract: No urinary tract calculi or obstructive uropathy. Bladder is unremarkable. The adrenals are normal. Stomach/Bowel: No bowel obstruction or ileus. Normal appendix right lower quadrant. No bowel wall thickening or inflammatory change. Vascular/Lymphatic: Aortic atherosclerosis. No enlarged abdominal or pelvic lymph nodes. Reproductive: Degenerating uterine fibroid right fundal aspect measuring up to 3.6 cm. Otherwise the uterus and adnexal structures are unremarkable. Other: No free fluid or free gas. No abdominal wall hernia. Musculoskeletal: No acute or destructive bony lesions. Reconstructed images demonstrate no additional findings. IMPRESSION: 1. No urinary tract calculi or obstructive uropathy. 2. Trace right pleural effusion. 3. Cholelithiasis without evidence of acute cholecystitis. 4. Degenerating uterine fibroid. 5. Aortic Atherosclerosis (ICD10-I70.0). Electronically Signed   By: Randa Ngo M.D.   On: 08/13/2020 17:47   VAS [place] LOWER EXTREMITY VENOUS (DVT)  Result Date: 08/14/2020  Lower Venous DVT Study Indications: Edema.  Risk Factors: None identified. Comparison Study: No prior studies. Performing Technologist: Oliver Hum RVT  Examination Guidelines: A complete evaluation includes B-mode imaging, spectral  Doppler, color Doppler, and power Doppler as needed of all accessible portions of each vessel. Bilateral testing is considered an integral part of a complete examination. Limited examinations for reoccurring indications may be performed as noted. The reflux portion of the  exam is performed with the patient in reverse Trendelenburg.  +---------+---------------+---------+-----------+----------+--------------+ RIGHT    CompressibilityPhasicitySpontaneityPropertiesThrombus Aging +---------+---------------+---------+-----------+----------+--------------+ CFV      Full           Yes      Yes                                 +---------+---------------+---------+-----------+----------+--------------+ SFJ      Full                                                        +---------+---------------+---------+-----------+----------+--------------+ FV Prox  Full                                                        +---------+---------------+---------+-----------+----------+--------------+ FV Mid   Full                                                        +---------+---------------+---------+-----------+----------+--------------+ FV DistalFull                                                        +---------+---------------+---------+-----------+----------+--------------+ PFV      Full                                                        +---------+---------------+---------+-----------+----------+--------------+ POP      Full           Yes      Yes                                 +---------+---------------+---------+-----------+----------+--------------+ PTV      Full                                                        +---------+---------------+---------+-----------+----------+--------------+ PERO     Full                                                        +---------+---------------+---------+-----------+----------+--------------+    +---------+---------------+---------+-----------+----------+--------------+ LEFT     CompressibilityPhasicitySpontaneityPropertiesThrombus Aging +---------+---------------+---------+-----------+----------+--------------+ CFV      Full           Yes      Yes                                 +---------+---------------+---------+-----------+----------+--------------+  SFJ      Full                                                        +---------+---------------+---------+-----------+----------+--------------+ FV Prox  Full                                                        +---------+---------------+---------+-----------+----------+--------------+ FV Mid   Full                                                        +---------+---------------+---------+-----------+----------+--------------+ FV DistalFull                                                        +---------+---------------+---------+-----------+----------+--------------+ PFV      Full                                                        +---------+---------------+---------+-----------+----------+--------------+ POP      Full           Yes      Yes                                 +---------+---------------+---------+-----------+----------+--------------+ PTV      Full                                                        +---------+---------------+---------+-----------+----------+--------------+ PERO     Full                                                        +---------+---------------+---------+-----------+----------+--------------+     Summary: RIGHT: - There is no evidence of deep vein thrombosis in the lower extremity.  - No cystic structure found in the popliteal fossa.  LEFT: - There is no evidence of deep vein thrombosis in the lower extremity.  - No cystic structure found in the popliteal fossa.  *See table(s) above for measurements and observations. Electronically signed  by Servando Snare MD on 08/14/2020 at 2:12:38 PM.    Final     Microbiology: No results found for this or any previous visit (from the past 240 hour(s)).   Labs: Basic Metabolic Panel: Recent Labs  Lab 08/19/20 0433 08/20/20 1155 08/23/20 1036  08/24/20 0854 08/25/20 0221  NA 136 134* 136 137 135  K 4.1 4.1 4.5 4.9 4.3  CL 107 104 105 103 102  CO2 19* 19* 21* 22 21*  GLUCOSE 115* 105* 179* 158* 174*  BUN 60* 57* 58* 58* 61*  CREATININE 4.55* 4.31* 4.23* 4.29* 4.16*  CALCIUM 8.6* 8.2* 8.8* 9.1 8.9   Liver Function Tests: No results for input(s): AST, ALT, ALKPHOS, BILITOT, PROT, ALBUMIN in the last 168 hours. No results for input(s): LIPASE, AMYLASE in the last 168 hours. No results for input(s): AMMONIA in the last 168 hours. CBC: Recent Labs  Lab 08/19/20 0433 08/20/20 1155 08/25/20 0221  WBC 6.4 6.0 11.1*  HGB 8.8* 8.6* 8.8*  HCT 25.5* 26.1* 27.1*  MCV 85.9 86.4 87.1  PLT 156 161 180   Cardiac Enzymes: No results for input(s): CKTOTAL, CKMB, CKMBINDEX, TROPONINI in the last 168 hours. BNP: BNP (last 3 results) Recent Labs    08/13/20 1618  BNP 973.1*    ProBNP (last 3 results) No results for input(s): PROBNP in the last 8760 hours.  CBG: No results for input(s): GLUCAP in the last 168 hours.     Signed:  Domenic Polite MD.  [facility] 08/25/2020, 10:29 AM

## 2020-08-25 NOTE — Plan of Care (Signed)

## 2020-08-26 NOTE — Progress Notes (Signed)
DISCHARGE NOTE SNF Christina Nunez to be discharged Home per MD order. Patient verbalized understanding.  Skin clean, dry and intact without evidence of skin break down, no evidence of skin tears noted. IV catheter discontinued intact. Site without signs and symptoms of complications. Dressing and pressure applied. Pt denies pain at the site currently. No complaints noted.  Patient free of lines, drains, and wounds.   Discharge packet assembled. An After Visit Summary (AVS) was printed and given to the EMS personnel. Patient escorted via stretcher and discharged to [facility] via ambulance. Report called to accepting facility; all questions and concerns addressed.   RN gave report to receiving nurse Tiffany Lavena Stanford, RN

## 2020-08-26 NOTE — TOC Transition Note (Signed)
Transition of Care [facility]) - CM/SW Discharge Note   Patient Details  Name: Christina Nunez MRN: [id number] Date of Birth: 1956-06-30  [facility]) CM/SW Contact:  Coralee Pesa, [place] Phone Number: 08/26/2020, 10:03 AM   Clinical Narrative:    Nurse to call report to [phone] Rm# [id number]   Final next level of care: [place] Barriers to Discharge: Barriers Resolved   Patient Goals and CMS Choice Patient states their goals for this hospitalization and ongoing recovery are:: go to rehab CMS Medicare.gov Compare Post Acute Care list provided to:: Patient Choice offered to / list presented to : Patient  Discharge Placement              Patient [facility] bed at: [facility] Patient to be transferred to facility by: [place] Name of family member notified: Elon Jester Patient and family notified of of transfer: 08/26/20  Discharge Plan and Services   Discharge Planning Services: CM Consult                                 Social Determinants of Health (SDOH) Interventions     Readmission Risk Interventions No flowsheet data found.

## 2021-05-31 ENCOUNTER — Inpatient Hospital Stay (HOSPITAL_COMMUNITY)
Admission: EM | Admit: 2021-05-31 | Discharge: 2021-06-10 | DRG: 064 | Disposition: A | Discharge status: Hospice | Payer: Medicaid Other | Attending: Neurology | Admitting: Neurology

## 2021-05-31 ENCOUNTER — Emergency Department (HOSPITAL_COMMUNITY): Payer: Medicaid Other

## 2021-05-31 ENCOUNTER — Inpatient Hospital Stay (HOSPITAL_COMMUNITY): Payer: Medicaid Other

## 2021-05-31 ENCOUNTER — Encounter (HOSPITAL_COMMUNITY): Payer: Self-pay

## 2021-05-31 DIAGNOSIS — E1165 Type 2 diabetes mellitus with hyperglycemia: Secondary | ICD-10-CM | POA: Diagnosis not present

## 2021-05-31 DIAGNOSIS — R34 Anuria and oliguria: Secondary | ICD-10-CM | POA: Diagnosis not present

## 2021-05-31 DIAGNOSIS — I609 Nontraumatic subarachnoid hemorrhage, unspecified: Secondary | ICD-10-CM | POA: Diagnosis present

## 2021-05-31 DIAGNOSIS — I629 Nontraumatic intracranial hemorrhage, unspecified: Secondary | ICD-10-CM | POA: Diagnosis not present

## 2021-05-31 DIAGNOSIS — I161 Hypertensive emergency: Secondary | ICD-10-CM | POA: Diagnosis present

## 2021-05-31 DIAGNOSIS — R739 Hyperglycemia, unspecified: Secondary | ICD-10-CM

## 2021-05-31 DIAGNOSIS — I615 Nontraumatic intracerebral hemorrhage, intraventricular: Secondary | ICD-10-CM | POA: Diagnosis not present

## 2021-05-31 DIAGNOSIS — R778 Other specified abnormalities of plasma proteins: Secondary | ICD-10-CM | POA: Diagnosis not present

## 2021-05-31 DIAGNOSIS — G8194 Hemiplegia, unspecified affecting left nondominant side: Secondary | ICD-10-CM | POA: Diagnosis present

## 2021-05-31 DIAGNOSIS — Z79899 Other long term (current) drug therapy: Secondary | ICD-10-CM

## 2021-05-31 DIAGNOSIS — N184 Chronic kidney disease, stage 4 (severe): Secondary | ICD-10-CM | POA: Diagnosis present

## 2021-05-31 DIAGNOSIS — Z7902 Long term (current) use of antithrombotics/antiplatelets: Secondary | ICD-10-CM

## 2021-05-31 DIAGNOSIS — N186 End stage renal disease: Secondary | ICD-10-CM | POA: Diagnosis not present

## 2021-05-31 DIAGNOSIS — Z8673 Personal history of transient ischemic attack (TIA), and cerebral infarction without residual deficits: Secondary | ICD-10-CM

## 2021-05-31 DIAGNOSIS — I214 Non-ST elevation (NSTEMI) myocardial infarction: Secondary | ICD-10-CM | POA: Diagnosis not present

## 2021-05-31 DIAGNOSIS — J9602 Acute respiratory failure with hypercapnia: Secondary | ICD-10-CM | POA: Diagnosis present

## 2021-05-31 DIAGNOSIS — N189 Chronic kidney disease, unspecified: Secondary | ICD-10-CM | POA: Diagnosis present

## 2021-05-31 DIAGNOSIS — R1310 Dysphagia, unspecified: Secondary | ICD-10-CM | POA: Diagnosis present

## 2021-05-31 DIAGNOSIS — R569 Unspecified convulsions: Secondary | ICD-10-CM | POA: Diagnosis not present

## 2021-05-31 DIAGNOSIS — G9389 Other specified disorders of brain: Secondary | ICD-10-CM | POA: Diagnosis present

## 2021-05-31 DIAGNOSIS — I2510 Atherosclerotic heart disease of native coronary artery without angina pectoris: Secondary | ICD-10-CM | POA: Diagnosis present

## 2021-05-31 DIAGNOSIS — G936 Cerebral edema: Secondary | ICD-10-CM | POA: Diagnosis present

## 2021-05-31 DIAGNOSIS — N179 Acute kidney failure, unspecified: Secondary | ICD-10-CM | POA: Diagnosis not present

## 2021-05-31 DIAGNOSIS — E872 Acidosis, unspecified: Secondary | ICD-10-CM | POA: Diagnosis present

## 2021-05-31 DIAGNOSIS — F03C3 Unspecified dementia, severe, with mood disturbance: Secondary | ICD-10-CM | POA: Diagnosis present

## 2021-05-31 DIAGNOSIS — Z951 Presence of aortocoronary bypass graft: Secondary | ICD-10-CM

## 2021-05-31 DIAGNOSIS — Z978 Presence of other specified devices: Secondary | ICD-10-CM | POA: Diagnosis not present

## 2021-05-31 DIAGNOSIS — Z66 Do not resuscitate: Secondary | ICD-10-CM | POA: Diagnosis not present

## 2021-05-31 DIAGNOSIS — D631 Anemia in chronic kidney disease: Secondary | ICD-10-CM | POA: Diagnosis present

## 2021-05-31 DIAGNOSIS — I1311 Hypertensive heart and chronic kidney disease without heart failure, with stage 5 chronic kidney disease, or end stage renal disease: Secondary | ICD-10-CM | POA: Diagnosis present

## 2021-05-31 DIAGNOSIS — H510 Palsy (spasm) of conjugate gaze: Secondary | ICD-10-CM | POA: Diagnosis present

## 2021-05-31 DIAGNOSIS — J690 Pneumonitis due to inhalation of food and vomit: Secondary | ICD-10-CM | POA: Diagnosis not present

## 2021-05-31 DIAGNOSIS — I619 Nontraumatic intracerebral hemorrhage, unspecified: Secondary | ICD-10-CM | POA: Diagnosis present

## 2021-05-31 DIAGNOSIS — G40909 Epilepsy, unspecified, not intractable, without status epilepticus: Secondary | ICD-10-CM | POA: Diagnosis present

## 2021-05-31 DIAGNOSIS — Z91040 Latex allergy status: Secondary | ICD-10-CM

## 2021-05-31 DIAGNOSIS — K219 Gastro-esophageal reflux disease without esophagitis: Secondary | ICD-10-CM | POA: Diagnosis present

## 2021-05-31 DIAGNOSIS — J9600 Acute respiratory failure, unspecified whether with hypoxia or hypercapnia: Secondary | ICD-10-CM | POA: Diagnosis not present

## 2021-05-31 DIAGNOSIS — E1122 Type 2 diabetes mellitus with diabetic chronic kidney disease: Secondary | ICD-10-CM | POA: Diagnosis present

## 2021-05-31 DIAGNOSIS — F209 Schizophrenia, unspecified: Secondary | ICD-10-CM | POA: Diagnosis present

## 2021-05-31 DIAGNOSIS — Z515 Encounter for palliative care: Secondary | ICD-10-CM

## 2021-05-31 DIAGNOSIS — G9349 Other encephalopathy: Secondary | ICD-10-CM | POA: Diagnosis present

## 2021-05-31 DIAGNOSIS — I6389 Other cerebral infarction: Secondary | ICD-10-CM | POA: Diagnosis not present

## 2021-05-31 DIAGNOSIS — J9601 Acute respiratory failure with hypoxia: Secondary | ICD-10-CM | POA: Diagnosis present

## 2021-05-31 DIAGNOSIS — I610 Nontraumatic intracerebral hemorrhage in hemisphere, subcortical: Secondary | ICD-10-CM | POA: Diagnosis not present

## 2021-05-31 DIAGNOSIS — Z7982 Long term (current) use of aspirin: Secondary | ICD-10-CM

## 2021-05-31 DIAGNOSIS — E785 Hyperlipidemia, unspecified: Secondary | ICD-10-CM | POA: Diagnosis present

## 2021-05-31 DIAGNOSIS — I10 Essential (primary) hypertension: Secondary | ICD-10-CM | POA: Diagnosis present

## 2021-05-31 DIAGNOSIS — E870 Hyperosmolality and hypernatremia: Secondary | ICD-10-CM | POA: Diagnosis not present

## 2021-05-31 DIAGNOSIS — Z20822 Contact with and (suspected) exposure to covid-19: Secondary | ICD-10-CM | POA: Diagnosis present

## 2021-05-31 DIAGNOSIS — R1312 Dysphagia, oropharyngeal phase: Secondary | ICD-10-CM | POA: Diagnosis not present

## 2021-05-31 DIAGNOSIS — T45515A Adverse effect of anticoagulants, initial encounter: Secondary | ICD-10-CM | POA: Diagnosis present

## 2021-05-31 DIAGNOSIS — Z4659 Encounter for fitting and adjustment of other gastrointestinal appliance and device: Secondary | ICD-10-CM

## 2021-05-31 DIAGNOSIS — D6832 Hemorrhagic disorder due to extrinsic circulating anticoagulants: Secondary | ICD-10-CM | POA: Diagnosis present

## 2021-05-31 DIAGNOSIS — I248 Other forms of acute ischemic heart disease: Secondary | ICD-10-CM | POA: Diagnosis present

## 2021-05-31 DIAGNOSIS — Z7189 Other specified counseling: Secondary | ICD-10-CM

## 2021-05-31 HISTORY — DX: Cerebral infarction, unspecified: I63.9

## 2021-05-31 HISTORY — DX: Essential (primary) hypertension: I10

## 2021-05-31 LAB — COMPREHENSIVE METABOLIC PANEL
ALT: 25 U/L (ref 0–44)
AST: 61 U/L — ABNORMAL HIGH (ref 15–41)
Albumin: 3.2 g/dL — ABNORMAL LOW (ref 3.5–5.0)
Alkaline Phosphatase: 68 U/L (ref 38–126)
Anion gap: 16 — ABNORMAL HIGH (ref 5–15)
BUN: 59 mg/dL — ABNORMAL HIGH (ref 8–23)
CO2: 14 mmol/L — ABNORMAL LOW (ref 22–32)
Calcium: 9.1 mg/dL (ref 8.9–10.3)
Chloride: 112 mmol/L — ABNORMAL HIGH (ref 98–111)
Creatinine, Ser: 6.65 mg/dL — ABNORMAL HIGH (ref 0.44–1.00)
GFR, Estimated: 6 mL/min — ABNORMAL LOW (ref >60)
Glucose, Bld: 193 mg/dL — ABNORMAL HIGH (ref 70–99)
Potassium: 3.8 mmol/L (ref 3.5–5.1)
Sodium: 142 mmol/L (ref 135–145)
Total Bilirubin: 0.8 mg/dL (ref 0.3–1.2)
Total Protein: 6.8 g/dL (ref 6.5–8.1)

## 2021-05-31 LAB — CBC
HCT: 40.9 % (ref 36.0–46.0)
Hemoglobin: 13.6 g/dL (ref 12.0–15.0)
MCH: 29.2 pg (ref 26.0–34.0)
MCHC: 33.3 g/dL (ref 30.0–36.0)
MCV: 88 fL (ref 80.0–100.0)
Platelets: 176 10*3/uL (ref 150–400)
RBC: 4.65 MIL/uL (ref 3.87–5.11)
RDW: 14.4 % (ref 11.5–15.5)
WBC: 14.9 10*3/uL — ABNORMAL HIGH (ref 4.0–10.5)
nRBC: 0 % (ref 0.0–0.2)

## 2021-05-31 LAB — POCT I-STAT 7, (LYTES, BLD GAS, ICA,H+H)
Acid-base deficit: 9 mmol/L — ABNORMAL HIGH (ref 0.0–2.0)
Bicarbonate: 15.7 mmol/L — ABNORMAL LOW (ref 20.0–28.0)
Calcium, Ion: 1.18 mmol/L (ref 1.15–1.40)
HCT: 33 % — ABNORMAL LOW (ref 36.0–46.0)
Hemoglobin: 11.2 g/dL — ABNORMAL LOW (ref 12.0–15.0)
O2 Saturation: 97 %
Patient temperature: 98.8
Potassium: 3.5 mmol/L (ref 3.5–5.1)
Sodium: 149 mmol/L — ABNORMAL HIGH (ref 135–145)
TCO2: 17 mmol/L — ABNORMAL LOW (ref 22–32)
pCO2 arterial: 30.5 mmHg — ABNORMAL LOW (ref 32.0–48.0)
pH, Arterial: 7.321 — ABNORMAL LOW (ref 7.350–7.450)
pO2, Arterial: 95 mmHg (ref 83.0–108.0)

## 2021-05-31 LAB — DIFFERENTIAL
Abs Immature Granulocytes: 0.14 10*3/uL — ABNORMAL HIGH (ref 0.00–0.07)
Basophils Absolute: 0 10*3/uL (ref 0.0–0.1)
Basophils Relative: 0 %
Eosinophils Absolute: 0 10*3/uL (ref 0.0–0.5)
Eosinophils Relative: 0 %
Immature Granulocytes: 1 %
Lymphocytes Relative: 11 %
Lymphs Abs: 1.6 10*3/uL (ref 0.7–4.0)
Monocytes Absolute: 0.5 10*3/uL (ref 0.1–1.0)
Monocytes Relative: 3 %
Neutro Abs: 12.6 10*3/uL — ABNORMAL HIGH (ref 1.7–7.7)
Neutrophils Relative %: 85 %

## 2021-05-31 LAB — I-STAT CHEM 8, ED
BUN: 56 mg/dL — ABNORMAL HIGH (ref 8–23)
Calcium, Ion: 1.07 mmol/L — ABNORMAL LOW (ref 1.15–1.40)
Chloride: 115 mmol/L — ABNORMAL HIGH (ref 98–111)
Creatinine, Ser: 6.9 mg/dL — ABNORMAL HIGH (ref 0.44–1.00)
Glucose, Bld: 188 mg/dL — ABNORMAL HIGH (ref 70–99)
HCT: 40 % (ref 36.0–46.0)
Hemoglobin: 13.6 g/dL (ref 12.0–15.0)
Potassium: 3.9 mmol/L (ref 3.5–5.1)
Sodium: 144 mmol/L (ref 135–145)
TCO2: 17 mmol/L — ABNORMAL LOW (ref 22–32)

## 2021-05-31 LAB — TROPONIN I (HIGH SENSITIVITY): Troponin I (High Sensitivity): 18301 ng/L (ref <18)

## 2021-05-31 LAB — SODIUM
Sodium: 143 mmol/L (ref 135–145)
Sodium: 150 mmol/L — ABNORMAL HIGH (ref 135–145)

## 2021-05-31 LAB — GLUCOSE, CAPILLARY: Glucose-Capillary: 148 mg/dL — ABNORMAL HIGH (ref 70–99)

## 2021-05-31 LAB — MAGNESIUM: Magnesium: 2.2 mg/dL (ref 1.7–2.4)

## 2021-05-31 LAB — PHOSPHORUS: Phosphorus: 5.3 mg/dL — ABNORMAL HIGH (ref 2.5–4.6)

## 2021-05-31 LAB — CBG MONITORING, ED: Glucose-Capillary: 180 mg/dL — ABNORMAL HIGH (ref 70–99)

## 2021-05-31 LAB — PROTIME-INR
INR: 1.1 (ref 0.8–1.2)
Prothrombin Time: 14.1 seconds (ref 11.4–15.2)

## 2021-05-31 LAB — ETHANOL: Alcohol, Ethyl (B): <10 mg/dL (ref <10)

## 2021-05-31 LAB — RESP PANEL BY RT-PCR (FLU A&B, COVID) ARPGX2
Influenza A by PCR: NEGATIVE
Influenza B by PCR: NEGATIVE
SARS Coronavirus 2 by RT PCR: NEGATIVE

## 2021-05-31 LAB — TSH: TSH: 1.7 u[IU]/mL (ref 0.350–4.500)

## 2021-05-31 LAB — APTT: aPTT: 25 seconds (ref 24–36)

## 2021-05-31 LAB — MRSA NEXT GEN BY PCR, NASAL: MRSA by PCR Next Gen: NOT DETECTED

## 2021-05-31 LAB — LACTIC ACID, PLASMA: Lactic Acid, Venous: 3.1 mmol/L (ref 0.5–1.9)

## 2021-05-31 IMAGING — CT CT ANGIO HEAD-NECK (W OR W/O PERF)
2 of 11 series · 6 of 35 positions shown · IV contrast (OMNI 350)
Comparison: None.

CLINICAL DATA: Neuro deficit, acute, stroke suspected

EXAM:
CT ANGIOGRAPHY HEAD AND NECK
TECHNIQUE: Multidetector CT imaging of the head and neck was performed using
the standard protocol during bolus administration of intravenous
contrast. Multiplanar CT image reconstructions and MIPs were
obtained to evaluate the vascular anatomy. Carotid stenosis
measurements (when applicable) are obtained utilizing NASCET
criteria, using the distal internal carotid diameter as the
denominator.
CONTRAST:  70mL OMNIPAQUE IOHEXOL 350 MG/ML SOLN

[Series 9: cta neck axial · axial · 0.39mm/px · z∈[+1178,+1386]mm · 5 of 314 slices shown]
[im 53/314  soft-tissue]
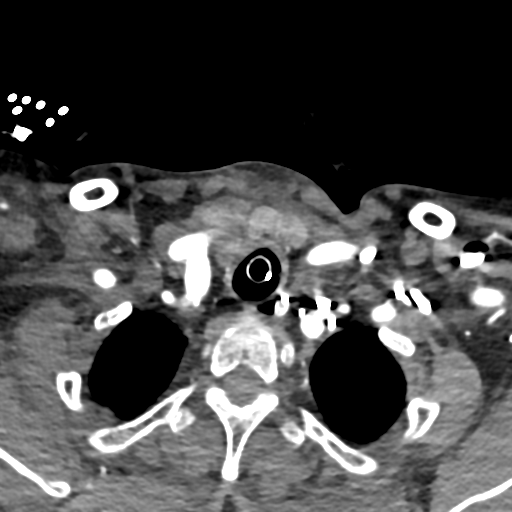
[im 105/314  bone]
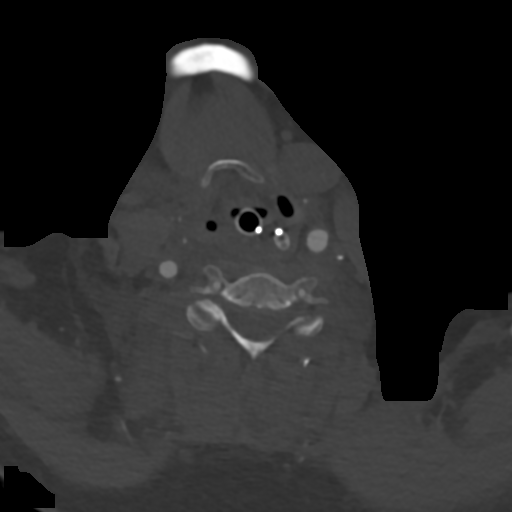
[im 157/314  soft-tissue]
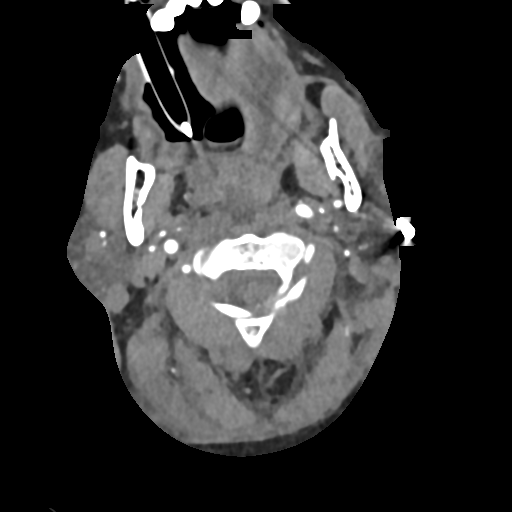
[im 209/314  bone]
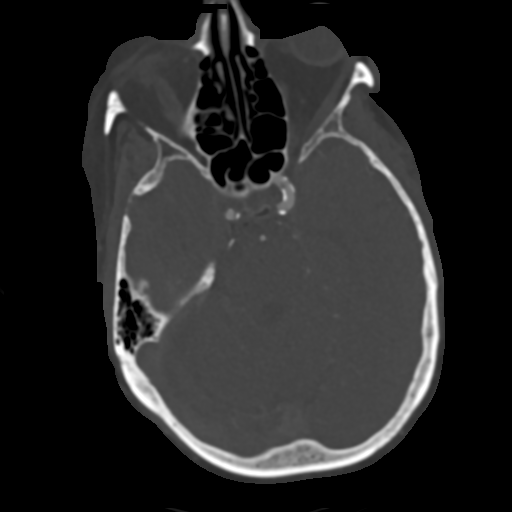
[im 261/314  soft-tissue]
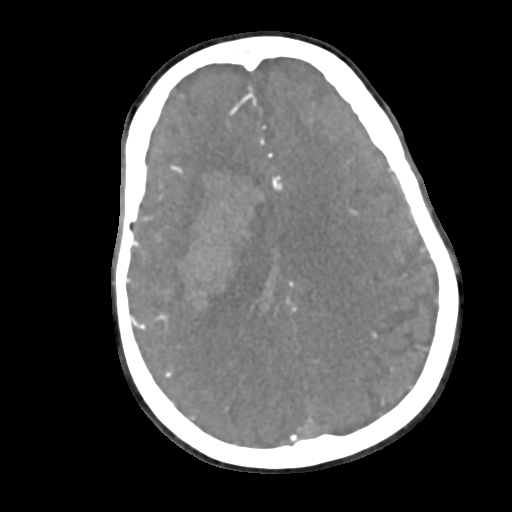

[Series 11: cta neck sagittal · sagittal · 0.41mm/px · 1 of 147 slices shown]
[im 74/147  soft-tissue]
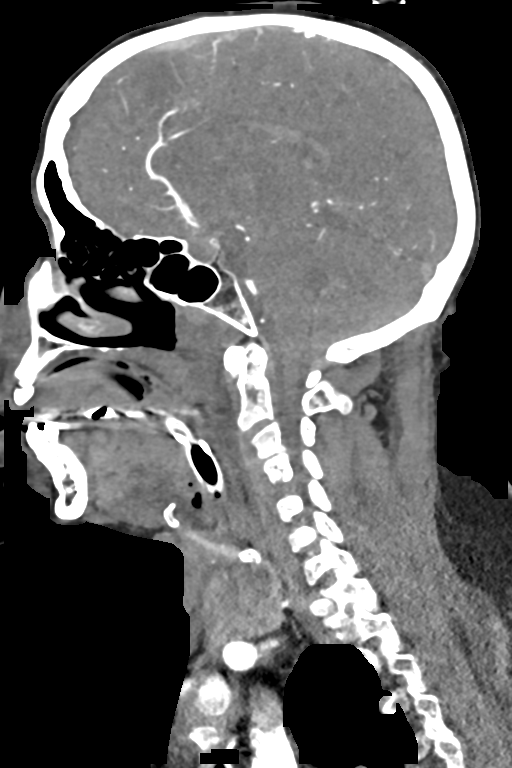

[6 of 35 positions shown; findings below may reference images not displayed]

FINDINGS: CT HEAD

Brain: There is a large area of hemorrhage within the right cerebral
hemisphere centered within the basal ganglia and surrounding white
matter. Approximate measurements of 5.8 x 4.5 x 5.6 cm. There is
adjacent sulcal subarachnoid hemorrhage with involvement of sylvian
fissure and right MCA cistern. Some contralateral sulcal
subarachnoid hemorrhage is present and could be post-traumatic or
reflect recirculation. Intraventricular extension is present. There
is surrounding edema with regional mass effect. Leftward midline
shift is present measuring approximately 8 mm. There is no ventricle
trapping at this time. No significant central herniation.

Vascular: Better evaluated on CTA portion.

Skull: Calvarium is unremarkable.

Sinuses/Orbits: No acute finding.

Other: None.

Review of the MIP images confirms the above findings

CTA NECK

Aortic arch: Great vessel origins are patent. There is mixed plaque
along the proximal left greater than right subclavian arteries
without significant stenosis.

Right carotid system: Patent. Mild primarily calcified plaque along
the proximal internal carotid with minimal stenosis.

Left carotid system: Patent. Trace calcified plaque along the
proximal internal carotid without stenosis.

Vertebral arteries: Patent.  Codominant.  No stenosis.

Skeleton: No significant abnormality.

Other neck: Enlarged, heterogeneous thyroid. Endotracheal and
enteric tubes are present.

Upper chest: Patchy ground-glass density in the included upper
lobes.

Review of the MIP images confirms the above findings

CTA HEAD

Anterior circulation: Intracranial internal carotid arteries are
patent with calcified plaque causing mild stenosis, greater on the
right. Anterior and middle cerebral arteries are patent. No aneurysm
identified.

Posterior circulation: Intracranial vertebral arteries are patent.
Basilar artery is patent. Major cerebellar artery origins are
patent. Bilateral posterior communicating arteries are present.
There is irregularity and moderate stenosis of the proximal to mid
right PCOM. Posterior cerebral arteries are patent. No aneurysm
identified.

Venous sinuses: Patent as allowed by contrast bolus timing.

Review of the MIP images confirms the above findings
IMPRESSION: Large area of parenchymal hemorrhage centered within the right basal
ganglia region and surrounding white matter. Subarachnoid extension
is present into adjacent sulci and ventricular system. Small volume
contralateral subarachnoid hemorrhage likely reflects recirculation
an absence of trauma.

Mass effect including 8 mm leftward midline shift. No ventricle
entrapment at this time.

No abnormal vascularity in the area of hemorrhage.  No aneurysm.

No hemodynamically significant stenosis in the neck. Irregularity
and moderate stenosis of the proximal to mid right posterior
communicating artery.

Patchy ground-glass density in the included upper lobes. May reflect
atelectasis, pneumonia, or edema.

Enlarged, heterogeneous thyroid. Ultrasound is recommended as an
outpatient if not previously performed.

Initial emergent results were called by telephone at the time of
interpretation on [DATE] at [DATE] to provider HYE , who
verbally acknowledged these results.

## 2021-05-31 IMAGING — DX DG CHEST 1V PORT
1 series · 1 of 1 positions shown · non-contrast
Comparison: [DATE]

CLINICAL DATA: Status post intubation.

EXAM:
PORTABLE CHEST 1 VIEW

[chest ap]
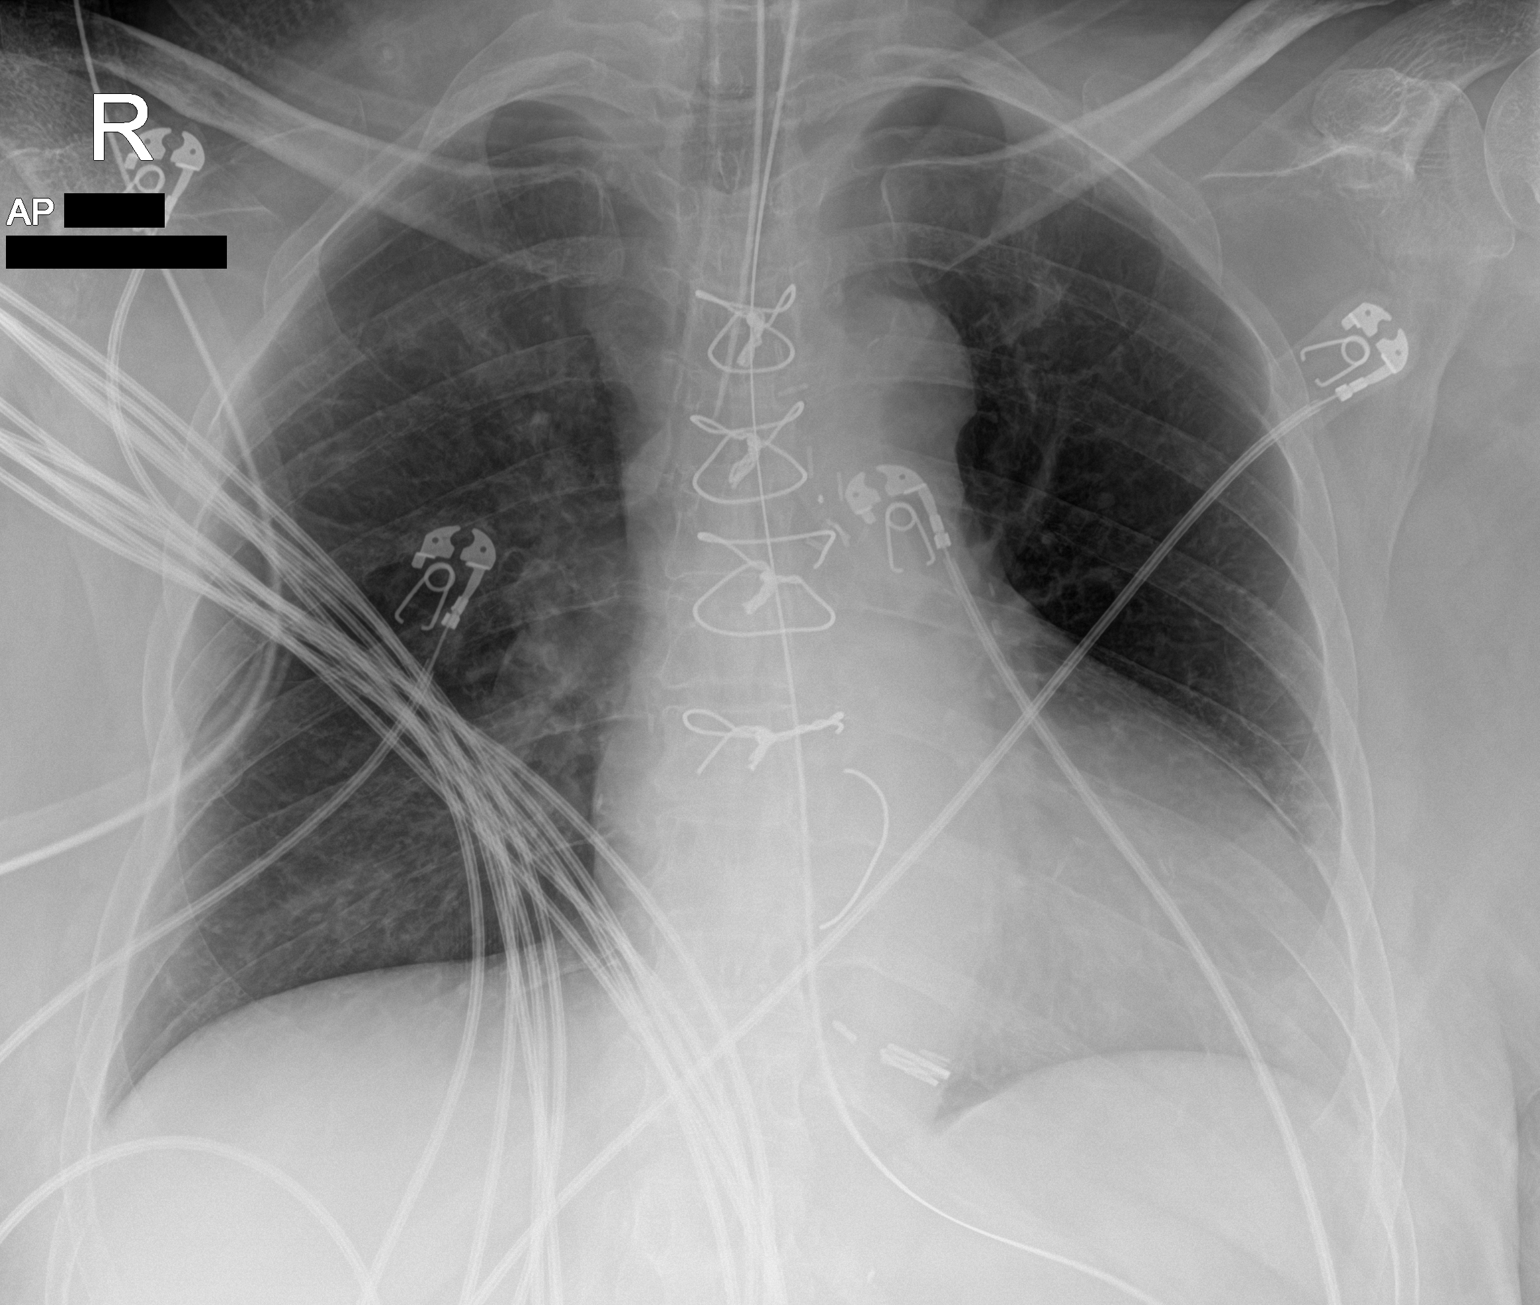

[1 of 1 positions shown; findings below may reference images not displayed]

FINDINGS: An endotracheal tube is seen with its distal tip approximately
cm from the carina. A nasogastric tube is noted with its distal end
within the body of the stomach. Multiple sternal wires and vascular
clips are seen. The heart size and mediastinal contours are within
normal limits. Both lungs are clear. The visualized skeletal
structures are unremarkable.
IMPRESSION: 1. Endotracheal tube positioning, as described above.
2. No acute or active cardiopulmonary disease.

## 2021-05-31 IMAGING — CT CT HEAD W/O CM
3 of 4 series · 14 of 47 positions shown, 16 images · non-contrast
Comparison: [DATE] at [DATE] p.m.

CLINICAL DATA: Parenchymal hemorrhage follow-up

EXAM:
CT HEAD WITHOUT CONTRAST
TECHNIQUE: Contiguous axial images were obtained from the base of the skull
through the vertex without intravenous contrast.

[Series 3: head 5.0 h30s · axial · 0.43mm/px · z∈[+1144,+1279]mm · 8 of 33 slices shown, 10 images]
[im 3/33  brain]
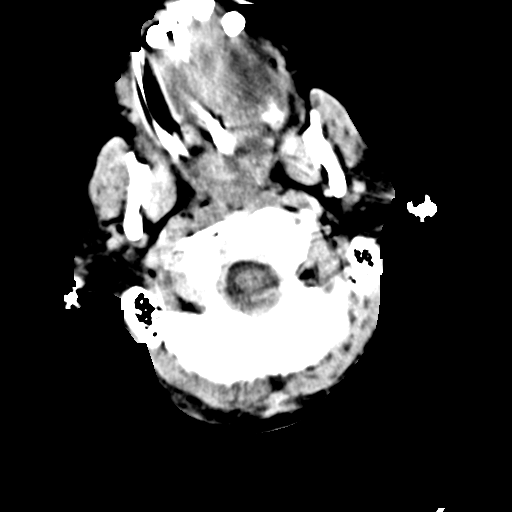
[im 3/33  bone]
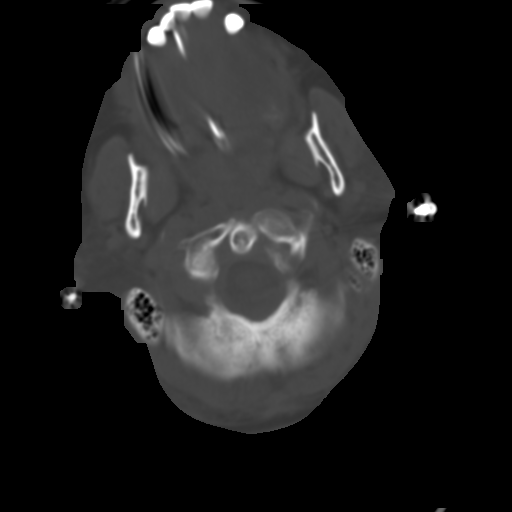
[im 6/33  brain]
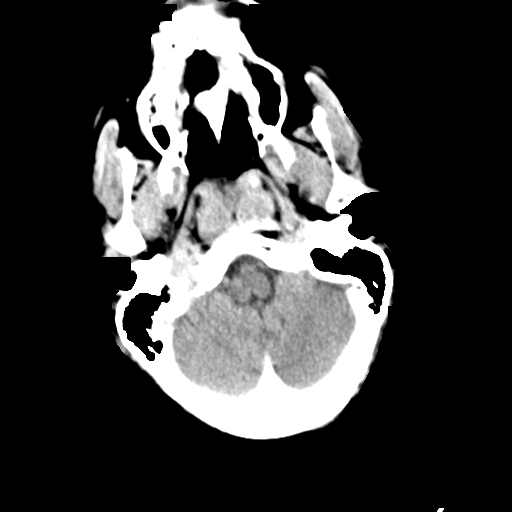
[im 12/33  brain]
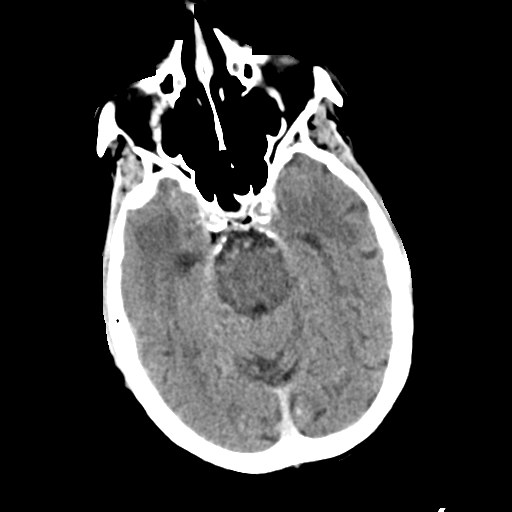
[im 15/33  brain]
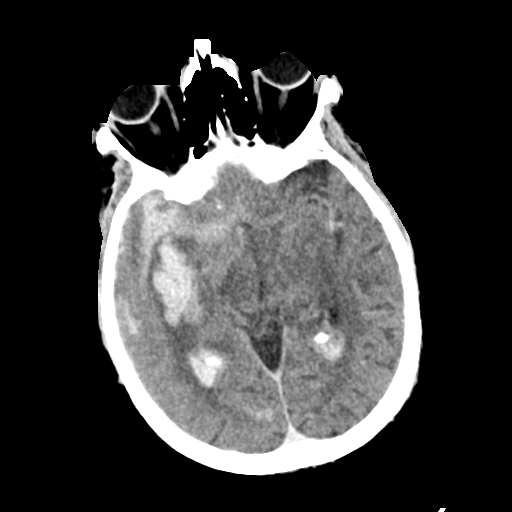
[im 18/33  brain]
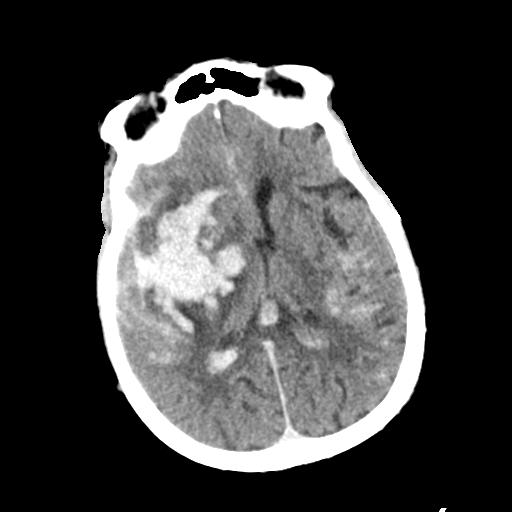
[im 18/33  bone]
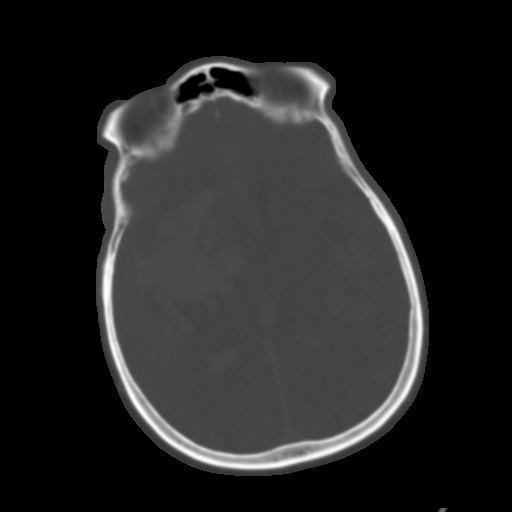
[im 21/33  brain]
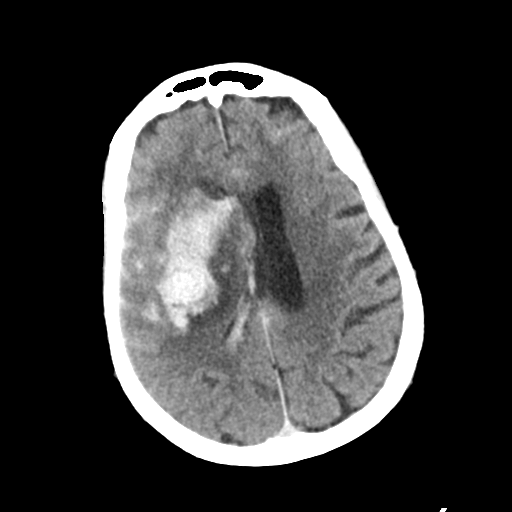
[im 27/33  brain]
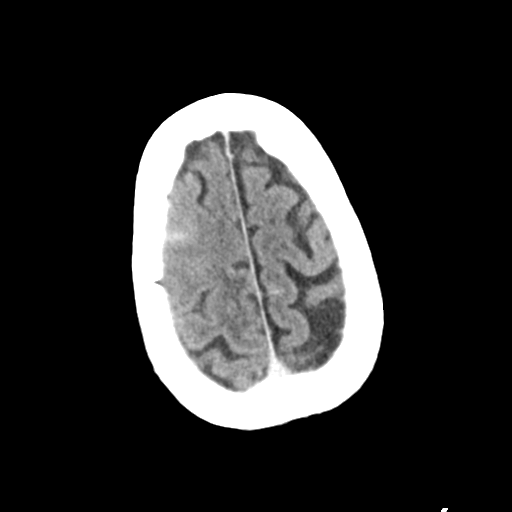
[im 30/33  brain]
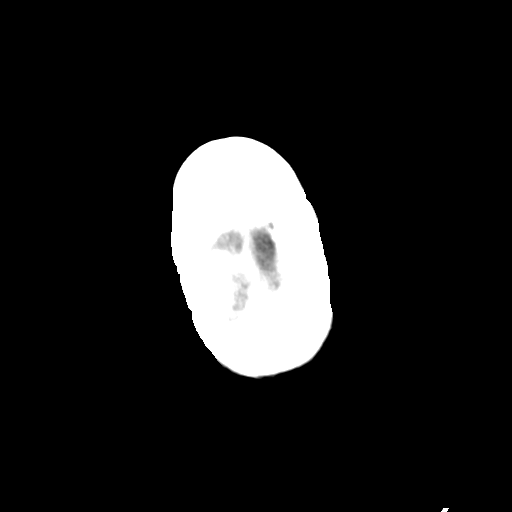

[Series 5: head 3.0 mpr cor · coronal · 0.29mm/px · 3 of 68 slices shown]
[im 26/68  brain]
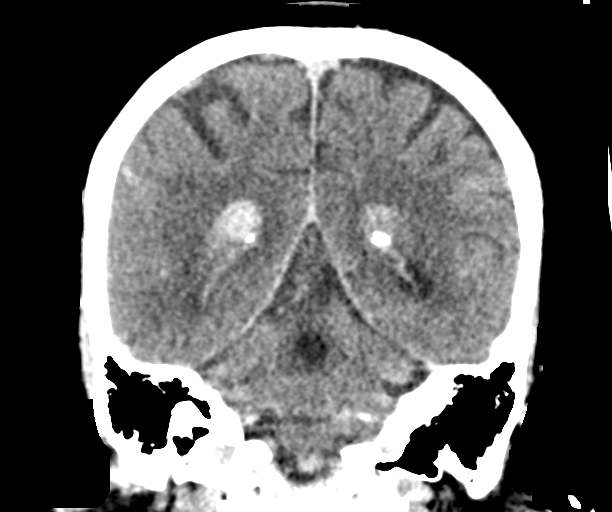
[im 31/68  brain]
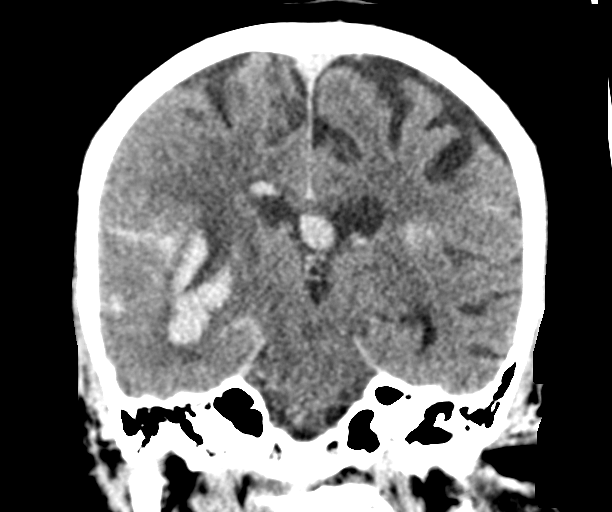
[im 37/68  brain]
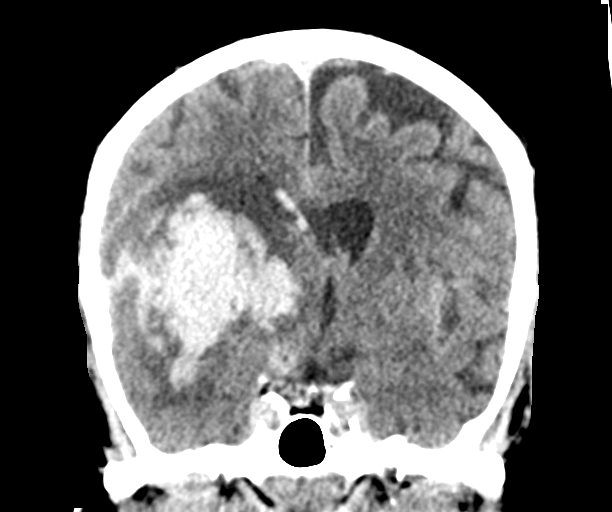

[Series 6: head 3.0 mpr sag · sagittal · 0.29mm/px · 3 of 58 slices shown]
[im 20/58  brain]
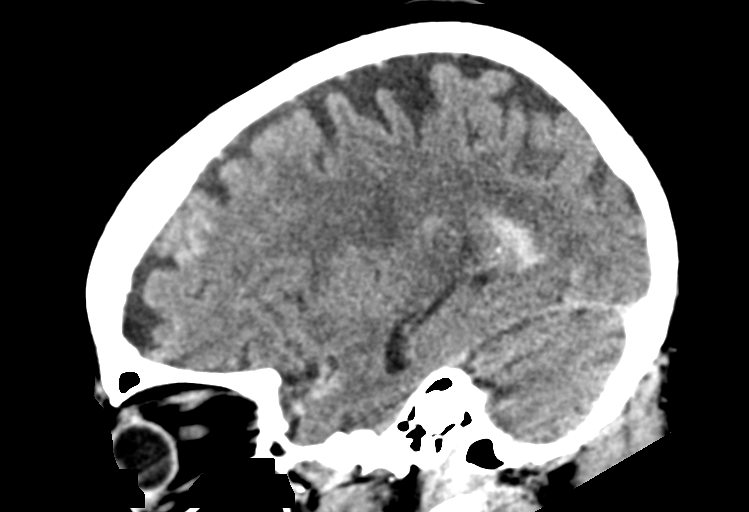
[im 29/58  brain]
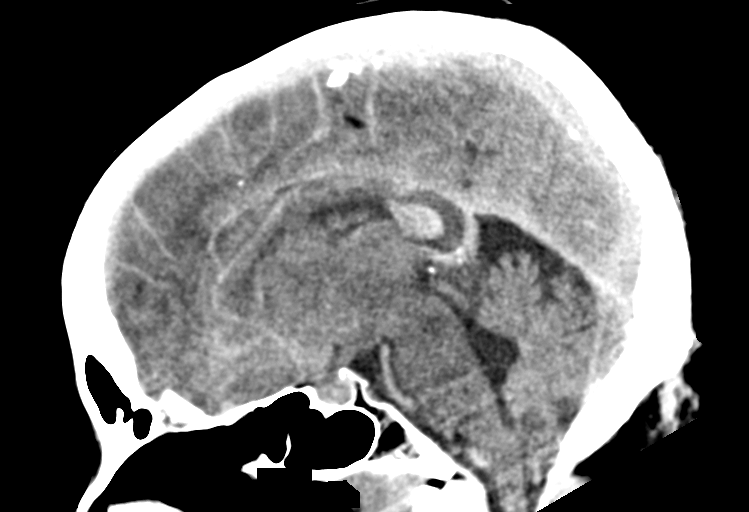
[im 39/58  brain]
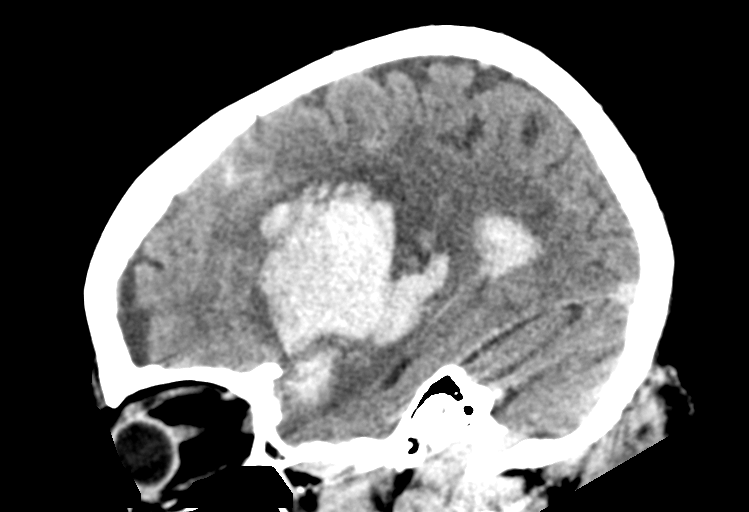

[14 of 47 positions shown; findings below may reference images not displayed]

FINDINGS: Brain: Unchanged appearance of massive intraparenchymal hematoma
centered in the right basal ganglia with leftward midline shift of
approximately 8 mm. There is subarachnoid blood again seen over both
hemispheres. The size and configuration of the ventricles are
unchanged.

Vascular: No abnormal hyperdensity of the major intracranial
arteries or dural venous sinuses. No intracranial atherosclerosis.

Skull: The visualized skull base, calvarium and extracranial soft
tissues are normal.

Sinuses/Orbits: No fluid levels or advanced mucosal thickening of
the visualized paranasal sinuses. No mastoid or middle ear effusion.
The orbits are normal.
IMPRESSION: 1. Unchanged appearance of massive intraparenchymal hematoma
centered in the right basal ganglia with leftward midline shift of
approximately 8 mm.
2. Unchanged subarachnoid blood over both hemispheres.

## 2021-05-31 IMAGING — DX DG CHEST 1V PORT
1 series · 2 of 2 positions shown · non-contrast
Comparison: Film from earlier in the same day.

CLINICAL DATA: Check gastric catheter placement

EXAM:
PORTABLE CHEST 1 VIEW

[Series 1: chest · 0.14mm/px · 2 of 2 slices shown]
[im 1/2]
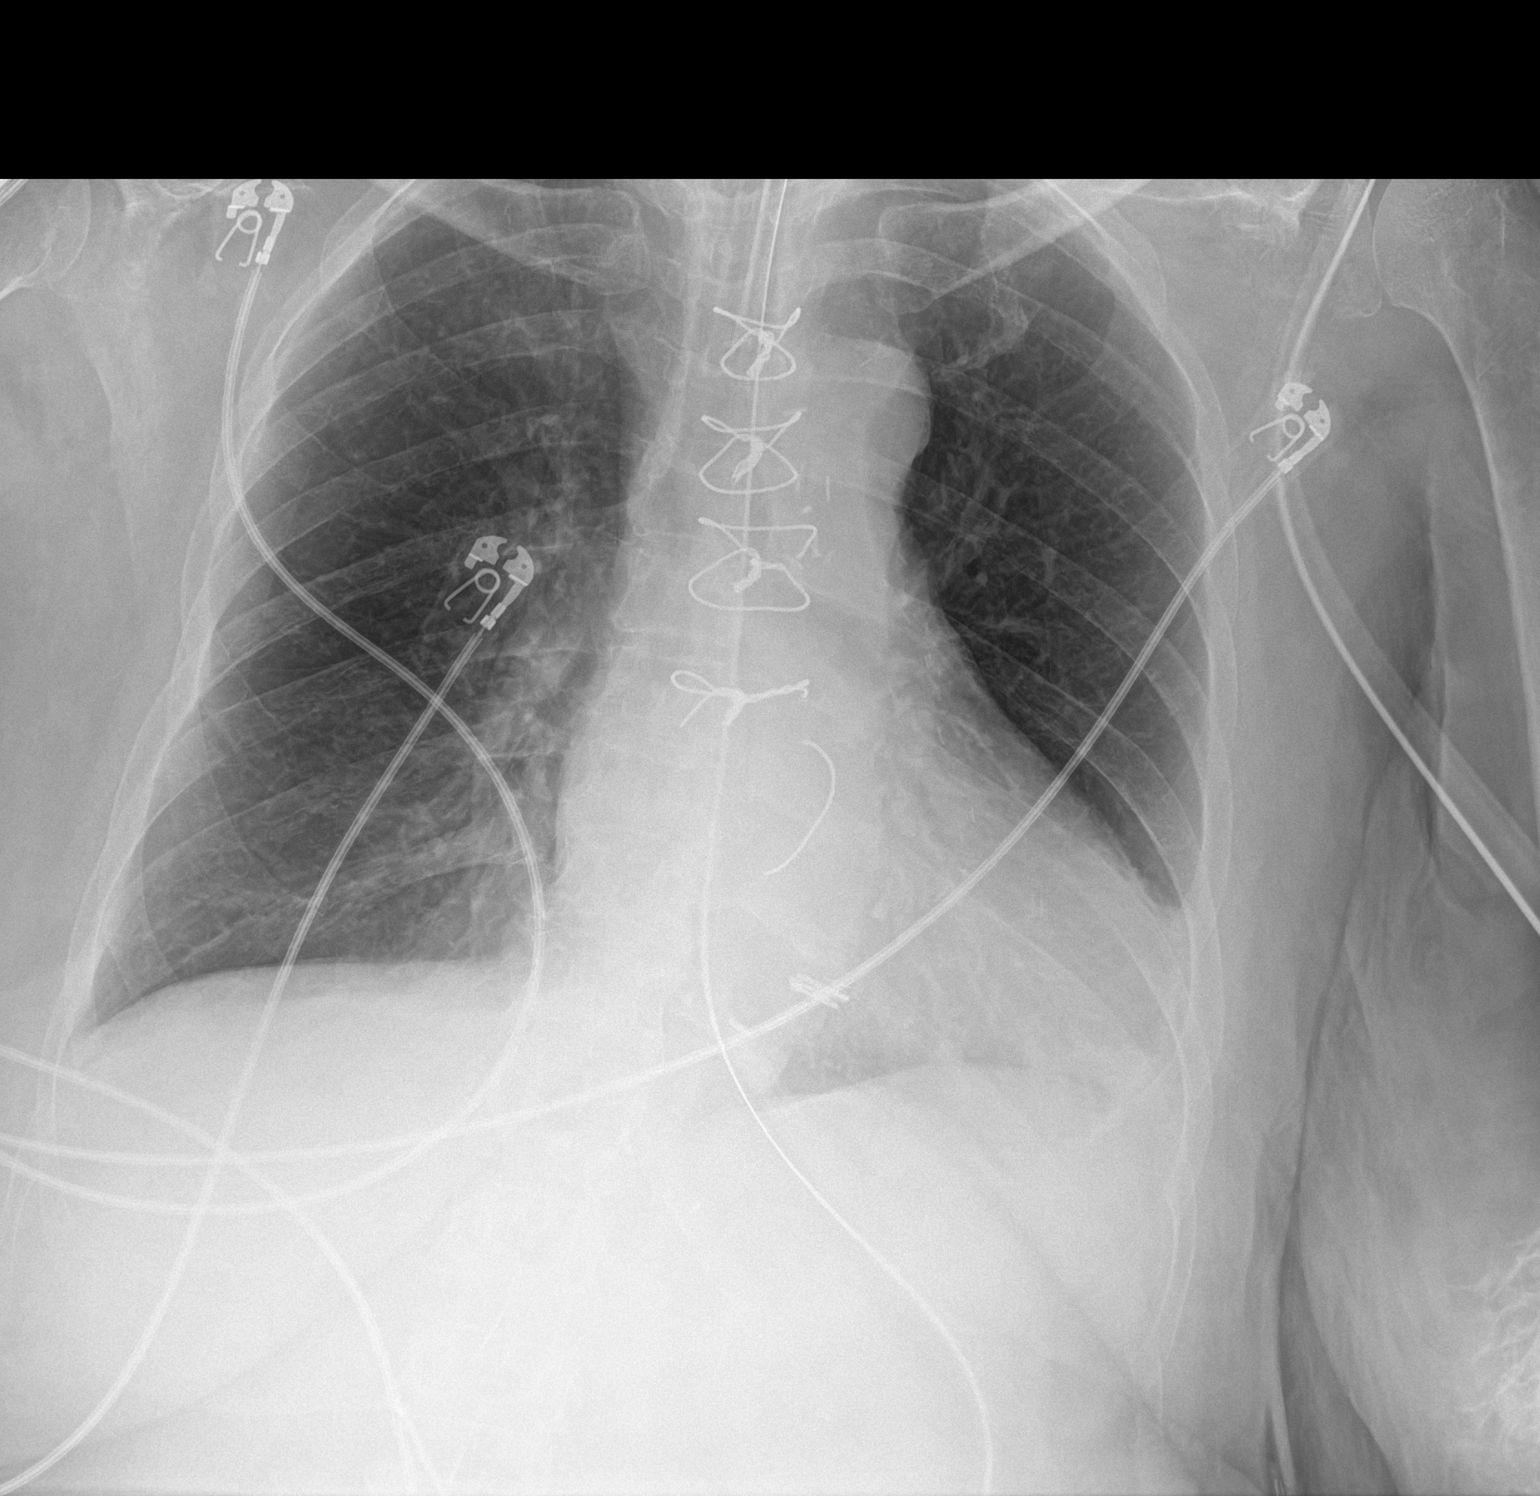
[im 2/2]
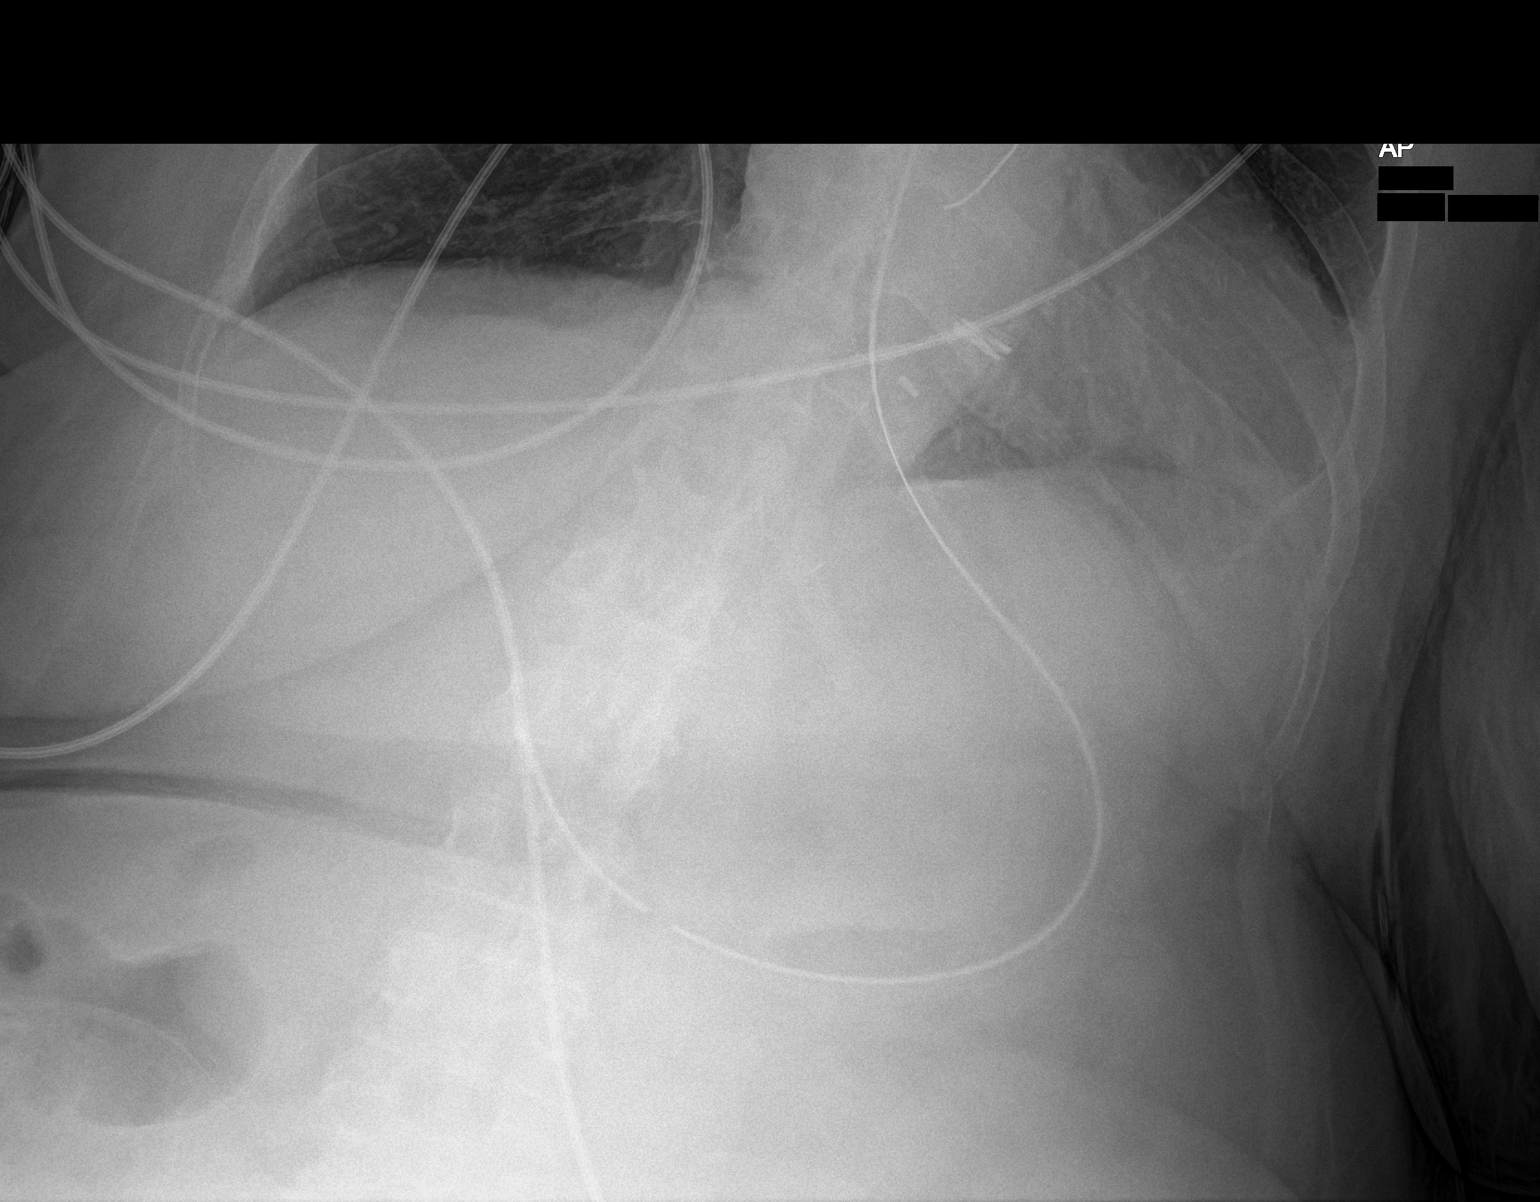

[2 of 2 positions shown; findings below may reference images not displayed]

FINDINGS: Endotracheal tube and gastric catheter are again noted in
satisfactory position. Cardiac shadow is stable. Postsurgical
changes are again seen. Lungs are clear. No bony abnormality is
noted.
IMPRESSION: Tubes and lines as described above.  No acute abnormality noted.

## 2021-05-31 MED ORDER — PROPOFOL 1000 MG/100ML IV EMUL
5.0000 ug/kg/min | INTRAVENOUS | Status: DC
  Administered 2021-05-31: 10 ug/kg/min via INTRAVENOUS

## 2021-05-31 MED ORDER — ACETAMINOPHEN 325 MG PO TABS: 650.0000 mg | ORAL_TABLET | ORAL | Status: DC | PRN

## 2021-05-31 MED ORDER — ETOMIDATE 2 MG/ML IV SOLN
INTRAVENOUS | Status: DC | PRN
Start: 2021-05-31 — End: 2021-06-02
  Administered 2021-05-31: 20 mg via INTRAVENOUS

## 2021-05-31 MED ORDER — SODIUM CHLORIDE 3 % IV BOLUS
250.0000 mL | Freq: Once | INTRAVENOUS | Status: AC
  Administered 2021-05-31: 250 mL via INTRAVENOUS
  Filled 2021-05-31: qty 500

## 2021-05-31 MED ORDER — CLEVIDIPINE BUTYRATE 0.5 MG/ML IV EMUL
0.0000 mg/h | INTRAVENOUS | Status: DC
  Administered 2021-05-31: 6 mg/h via INTRAVENOUS
  Administered 2021-05-31: 4 mg/h via INTRAVENOUS
  Administered 2021-06-01 (×3): 8 mg/h via INTRAVENOUS
  Filled 2021-05-31: qty 100
  Filled 2021-05-31 (×3): qty 50

## 2021-05-31 MED ORDER — CHLORHEXIDINE GLUCONATE 0.12% ORAL RINSE (MEDLINE KIT)
15.0000 mL | Freq: Two times a day (BID) | OROMUCOSAL | Status: DC
  Administered 2021-05-31 – 2021-06-05 (×9): 15 mL via OROMUCOSAL

## 2021-05-31 MED ORDER — SODIUM CHLORIDE 0.9 % IV BOLUS
1000.0000 mL | Freq: Once | INTRAVENOUS | Status: AC
  Administered 2021-05-31: 1000 mL via INTRAVENOUS

## 2021-05-31 MED ORDER — PANTOPRAZOLE SODIUM 40 MG IV SOLR
40.0000 mg | Freq: Every day | INTRAVENOUS | Status: DC
  Administered 2021-05-31 – 2021-06-01 (×2): 40 mg via INTRAVENOUS
  Filled 2021-05-31 (×2): qty 40

## 2021-05-31 MED ORDER — ACETAMINOPHEN 160 MG/5ML PO SOLN
650.0000 mg | ORAL | Status: DC | PRN
  Administered 2021-06-01 – 2021-06-05 (×7): 650 mg
  Filled 2021-05-31 (×7): qty 20.3

## 2021-05-31 MED ORDER — FENTANYL CITRATE PF 50 MCG/ML IJ SOSY
50.0000 ug | PREFILLED_SYRINGE | INTRAMUSCULAR | Status: DC | PRN
  Administered 2021-06-01 – 2021-06-04 (×7): 50 ug via INTRAVENOUS
  Administered 2021-06-04: 100 ug via INTRAVENOUS
  Administered 2021-06-04 (×2): 50 ug via INTRAVENOUS
  Administered 2021-06-05 (×2): 100 ug via INTRAVENOUS
  Filled 2021-05-31 (×3): qty 1
  Filled 2021-05-31: qty 2
  Filled 2021-05-31: qty 1
  Filled 2021-05-31: qty 2
  Filled 2021-05-31 (×3): qty 1
  Filled 2021-05-31: qty 4
  Filled 2021-05-31: qty 1
  Filled 2021-05-31: qty 2

## 2021-05-31 MED ORDER — ORAL CARE MOUTH RINSE
15.0000 mL | OROMUCOSAL | Status: DC
  Administered 2021-05-31 – 2021-06-05 (×50): 15 mL via OROMUCOSAL

## 2021-05-31 MED ORDER — LABETALOL HCL 5 MG/ML IV SOLN: 20.0000 mg | Freq: Once | INTRAVENOUS | Status: DC

## 2021-05-31 MED ORDER — CLEVIDIPINE BUTYRATE 0.5 MG/ML IV EMUL
INTRAVENOUS | Status: DC | PRN
  Administered 2021-05-31: 1 mg/h via INTRAVENOUS

## 2021-05-31 MED ORDER — POLYETHYLENE GLYCOL 3350 17 G PO PACK
17.0000 g | PACK | Freq: Every day | ORAL | Status: DC
  Administered 2021-06-01: 17 g
  Filled 2021-05-31: qty 1

## 2021-05-31 MED ORDER — PROPOFOL 1000 MG/100ML IV EMUL: 0.0000 ug/kg/min | INTRAVENOUS | Status: DC

## 2021-05-31 MED ORDER — DOCUSATE SODIUM 50 MG/5ML PO LIQD
100.0000 mg | Freq: Two times a day (BID) | ORAL | Status: DC
  Administered 2021-05-31 – 2021-06-04 (×4): 100 mg
  Filled 2021-05-31 (×5): qty 10

## 2021-05-31 MED ORDER — SODIUM CHLORIDE 0.9% FLUSH
3.0000 mL | Freq: Once | INTRAVENOUS | Status: AC
Start: 2021-05-31 — End: 2021-05-31
  Administered 2021-05-31: 3 mL via INTRAVENOUS

## 2021-05-31 MED ORDER — IOHEXOL 350 MG/ML SOLN
70.0000 mL | Freq: Once | INTRAVENOUS | Status: AC | PRN
  Administered 2021-05-31: 70 mL via INTRAVENOUS

## 2021-05-31 MED ORDER — ACETAMINOPHEN 650 MG RE SUPP: 650.0000 mg | RECTAL | Status: DC | PRN

## 2021-05-31 MED ORDER — ALBUMIN HUMAN 25 % IV SOLN
12.5000 g | Freq: Once | INTRAVENOUS | Status: AC
  Administered 2021-05-31: 12.5 g via INTRAVENOUS
  Filled 2021-05-31: qty 50

## 2021-05-31 MED ORDER — FLUOXETINE HCL 20 MG/5ML PO SOLN
40.0000 mg | Freq: Every day | ORAL | Status: DC
  Filled 2021-05-31: qty 10

## 2021-05-31 MED ORDER — NOREPINEPHRINE 4 MG/250ML-% IV SOLN: 0.0000 ug/min | INTRAVENOUS | Status: DC

## 2021-05-31 MED ORDER — ROCURONIUM BROMIDE 50 MG/5ML IV SOLN
INTRAVENOUS | Status: DC | PRN
  Administered 2021-05-31: 90 mg via INTRAVENOUS

## 2021-05-31 MED ORDER — SODIUM CHLORIDE 3 % IV SOLN
INTRAVENOUS | Status: DC
  Filled 2021-05-31 (×5): qty 500

## 2021-05-31 MED ORDER — INSULIN ASPART 100 UNIT/ML IJ SOLN
0.0000 [IU] | INTRAMUSCULAR | Status: DC
  Administered 2021-06-01 (×3): 1 [IU] via SUBCUTANEOUS
  Administered 2021-06-01: 2 [IU] via SUBCUTANEOUS
  Administered 2021-06-01 – 2021-06-02 (×6): 1 [IU] via SUBCUTANEOUS
  Administered 2021-06-03 (×3): 2 [IU] via SUBCUTANEOUS
  Administered 2021-06-03 (×2): 1 [IU] via SUBCUTANEOUS
  Administered 2021-06-03 – 2021-06-04 (×3): 2 [IU] via SUBCUTANEOUS
  Administered 2021-06-04: 1 [IU] via SUBCUTANEOUS
  Administered 2021-06-04: 2 [IU] via SUBCUTANEOUS
  Administered 2021-06-04: 1 [IU] via SUBCUTANEOUS
  Administered 2021-06-04 – 2021-06-05 (×3): 2 [IU] via SUBCUTANEOUS
  Administered 2021-06-05: 1 [IU] via SUBCUTANEOUS
  Administered 2021-06-05: 2 [IU] via SUBCUTANEOUS
  Administered 2021-06-05: 1 [IU] via SUBCUTANEOUS
  Administered 2021-06-06: 2 [IU] via SUBCUTANEOUS
  Administered 2021-06-06: 1 [IU] via SUBCUTANEOUS
  Administered 2021-06-07 (×3): 2 [IU] via SUBCUTANEOUS
  Administered 2021-06-07: 1 [IU] via SUBCUTANEOUS
  Administered 2021-06-07 – 2021-06-08 (×2): 2 [IU] via SUBCUTANEOUS
  Administered 2021-06-08 (×2): 1 [IU] via SUBCUTANEOUS

## 2021-05-31 MED ORDER — SENNOSIDES-DOCUSATE SODIUM 8.6-50 MG PO TABS
1.0000 | ORAL_TABLET | Freq: Two times a day (BID) | ORAL | Status: DC
  Filled 2021-05-31: qty 1

## 2021-05-31 MED ORDER — STROKE: EARLY STAGES OF RECOVERY BOOK
Freq: Once | Status: DC
Start: 2021-05-31 — End: 2021-06-08
  Filled 2021-05-31: qty 1

## 2021-05-31 MED ORDER — FENTANYL CITRATE PF 50 MCG/ML IJ SOSY
50.0000 ug | PREFILLED_SYRINGE | INTRAMUSCULAR | Status: AC | PRN
  Administered 2021-06-01 – 2021-06-02 (×3): 50 ug via INTRAVENOUS
  Filled 2021-05-31 (×3): qty 1

## 2021-05-31 NOTE — Progress Notes (Signed)
eLink Physician-Brief Progress Note Patient Name: Christina Nunez DOB: Jan 06, 1956 MRN: [id number]   Date of Service  05/31/2021  HPI/Events of Note  Lactic acid 3.1  eICU Interventions  Will give patient 12.5 gm of 25 % Albumin x 1.        Kerry Kass Anmol Fleck 05/31/2021, 9:02 PM

## 2021-05-31 NOTE — H&P (Signed)
Neurology Admission H&P    Chief Complaint: ICH.   HPI: [name] is a 65 yo female with a PMHx of mild dementia, CKD IV, pre DM, HLD, ischemic stroke, HTN, and depression. Patient arrived by EMS withLKW at 0750. HHRN could not get in the house, so family was called and they found patient unresponsive next to her bed. Patient had seizure activity en route and was given Versed 2.'5mg'$  per EMS.   Patient was taken emergently to ED room, where she was intubated. Her BP was high. After intubation, the patient was taken to CT where scan showed ICH.   3% saline given as 500cc bolus, then start 75cc/hr. Patient admitted and orders placed.   In review of chart, there is little info in Epic nor in care everywhere.   LKW: 0730 tPA: No, ICH.  IR:No, bleed.  MRS: 1  Past Medical History:  Diagnosis Date   Hypertension    Stroke [facility])    History reviewed. No pertinent surgical history.  No family history on file.  Social History:  has no history on file for tobacco use, alcohol use, and drug use.  Allergies: No Known Allergies  ROS: Unable to attain due to emergent nature of event and/or mental status.   Physical Examination: General: Acutely ill female. Obtunded.   HEENT-  Normocephalic, AT. ETT in place.  Cardiovascular - RRR. No LE edema.  Lungs - On Ventilator. Abdomen - soft, NT. Extremities - warm, well perfused. Skin: WDI.  Neuro exam: GCS 3 (1 for each)  Mental Status: Obtunded on sedation. Does not follow commands.  Mute with ETT.  Cranial Nerves:  II: Sluggish pupils at 25m.  III, IV, VI: eyes midline.  V, VII: no reaction to stroking face or brow pressure.   VIII: No reaction to voice or clapping. ETT.  XI, X: Head grossly midline. No cough or gag reflex.  XII: ETT in place. Motor:  No withdrawal to pain. Does not move extremities on command nor spontaneously.  Sensation- Intact to light touch bilaterally. Extinction absent to light touch to DSS.   Coordination: No clonus noted.  Plantars: downgoing.  Gait- deferred bedrest.  Labs pertinent to this encounter: INR 1.1    aPTT  25       creat  6.90     glucose 188      Na 149  Imaging: Independently reviewed by MD.   CTH There is a large area of hemorrhage within the right cerebral hemisphere centered within the basal ganglia and surrounding white matter. Approximate measurements of 5.8 x 4.5 x 5.6 cm. There is adjacent sulcal subarachnoid hemorrhage with involvement of sylvian fissure and right MCA cistern. Some contralateral sulcal subarachnoid hemorrhage is present and could be post-traumatic or reflect recirculation. Intraventricular extension is present. There is surrounding edema with regional mass effect. Leftward midline shift is present measuring approximately 8 mm. There is no ventricle trapping at this time. No significant central herniation.  CTA head and neck -Large area of parenchymal hemorrhage centered within the right basal ganglia region and surrounding white matter. Subarachnoid extension is present into adjacent sulci and ventricular system.  -Small volume contralateral subarachnoid hemorrhage likely reflects recirculation an absence of trauma.  -Mass effect including 8 mm leftward midline shift. No ventricle entrapment at this time.  -No abnormal vascularity in the area of hemorrhage.  No aneurysm. -No hemodynamically significant stenosis in the neck. Irregularity and moderate stenosis of the proximal to mid right posterior communicating artery.  Assessment: 65 yo female who was last seen normal at 0730 and later was found down beside her bed by her family.She was emergently intubated on arrival. CTH positive for large area of parenchymal hemorrhage centered within the right basal ganglia region. Subarachnoid extention also present with intraventricular extension (ICH 4). 33m leftward midline shift.   Plan:  Admit to neuro ICU.  PCCM consult to manage ETT and  ventilator.  Elevate head of bed keep head midline. Start clevidipine drip for goal SBP<140. Bolus 3% Na followed by infusion - Goal serum Na 145-155. Serum Na every 6 hours. X-ray chest. Admit to neuro ICU. Consult neurosurgery. Sedation, analgesics (fentanyl). Hold antiplatelets and anticoagulation for now. IV fluids gentle hydration. Repeat CT head in 6 hours (or sooner if clinical worsening). Echocardiogram. MRI brain without contrast when stable. Keep platelets >100k, INR<1.4 Replete electrolytes as needed. Labs: Coags, CBC, type and cross, CMP, Mg, Phos, fasting lipids, hA1c, hCRP, troponins, urinalysis. Normothermia - Acetaminophen for temperature >37.5C Euglycemia (~ <180) Euvolemia - Strict I/Os Precautions: Airway and herniation, seizure, aspiration. PPx: SCDs for now, Senna/docusate, PPI. Stroke team to follow.  This patient is critically ill and at significant risk of neurological worsening, death and care requires constant monitoring of vital signs, hemodynamics,respiratory and cardiac monitoring, neurological assessment, discussion with family, other specialists and medical decision making of high complexity. I spent 73 minutes of neurocritical care time  in the care of  this patient. This was time spent independent of any time provided by nurse practitioner or PA.  HLynnae Sandhoff MD Stroke Neurology Page: [phone]

## 2021-05-31 NOTE — Progress Notes (Signed)
RT NOTE:  Pt transported to CT and back to 4N31 without event.

## 2021-05-31 NOTE — Progress Notes (Signed)
Ventilator patient transported from the ED to 4N31 without any complications.

## 2021-05-31 NOTE — Progress Notes (Signed)
eLink Physician-Brief Progress Note Patient Name: Chaquana Leduff DOB: [birth year] MRN: [id number]   Date of Service  05/31/2021  HPI/Events of Note  Patient with Troponin of 18,301, given ICH and advanced renal failure it likely represents extreme demand against a background of renal failure, EKG is under-whelming and patient is not a candidate for PCI anyhow, BP is too soft for the usual peri-infarct pharmacologic interventions like beta blockers and nitrates.  eICU Interventions  Will order an echocardiogram and consult cardiology.        Kerry Kass Kinsly Hild 05/31/2021, 10:47 PM

## 2021-05-31 NOTE — Progress Notes (Addendum)
Independently examined pt, evaluated data & formulated above care plan with NP  Patient is intubated and sedated, history obtained from speaking to sister at the bedside. 65 year old woman with CKD stage IV, dementia, CABG, found unresponsive on her bed, sister called 60 and started CPR, witnessed seizure by sister, given Versed by EMS on route, intubated for airway protection after arrival to the ED.  Blood pressure was high head CT showed large parenchymal hemorrhage in the right basal ganglia with 8 mm mass-effect and leftward shift.  Started on 3% saline and transferred to the ICU.  We were emergently called for consultation  On exam -unresponsive , flaccid extremities, plantars left upgoing, S1-S2 regular, no murmur, clear breath sounds bilateral, large goiter  Labs show BUN 59, creatinine 6.6 increased from baseline 4.6  Chest x-ray independently reviewed shows ET tube in position, no infiltrates.  Impression/plan Right ICH with midline shift and cerebral edema -3% saline per protocol -Maintain blood pressure 1 20-1 60 range , use Cleviprex if needed currently not required.  Seizure -defer to neurology, may need to load with Keppra  Acute respiratory failure -vent settings reviewed and adjusted. ABG shows mild metabolic acidosis  AKI on CKD stage IV -received contrast for CT head, follow -Not a candidate for dialysis  Poor prognosis given to sister at the bedside  My independent critical care time x 45 m  Christina Nunez V. Elsworth Soho MD

## 2021-05-31 NOTE — ED Triage Notes (Signed)
Pt from home with ems, LSN 750 this morning. HHRN went to check on pt and could not get into the house, family called to come get into house, found pt on floor beside her bed, unresponsive. Seizure like activty en route, 2.'5mg'$  versed given.   BP 200 initially, 150/116, CBG 163 NPA/NRB

## 2021-05-31 NOTE — Consult Note (Signed)
NAME:  Christina Nunez, MRN:  [id number], DOB:  1956-05-08, LOS: 0 ADMISSION DATE:  05/31/2021, CONSULTATION DATE: 10/14 REFERRING MD: Dr. Theda Sers, CHIEF COMPLAINT: Stroke  History of Present Illness:  Patient is a 65 year old female with PMH of ischemic stroke, HTN, HLD, CKD IV, mild dementia, pre-DM presents to [facility] ED on 10/14 for unresponsiveness.  Patient's last known normal at 7:50 AM on 10/14.  [place] RN was unable to get into house today, family called and patient was found unresponsive next to bed.  EMS arrived and patient had seizure like activity in route to [facility] ED.  2.5 mg of Versed given.  Upon arrival to [facility] ED 10/14, patient was intubated for airway protection.  BP was high.  CT head: Large parenchymal hemorrhage within right basal ganglia region and surrounding white matter; mass-effect with 8 mm leftward midline shift without ventricle entrapment. Neuro consulted.  Hypertonic saline started. Started on Cleviprex. Patient admitted to ICU.  PCCM consulted for management of vent while intubated and medical management.  Pertinent  Medical History   Past Medical History:  Diagnosis Date   Hypertension    Stroke ([place])      Significant Hospital Events: Including procedures, antibiotic start and stop dates in addition to other pertinent events   10/14: Code stroke; PCCM consulted for vent management while in ICU  Interim History / Subjective:  Patient is intubated on mechanical ventilation PRVC.  Sats 100% on 40% FiO2 Paused propofol for neuro exam: Not following commands; not responding to painful stimuli; no cough/gag reflex; sluggish pupils bilaterally 2 mm Patient on Cleviprex drip  Objective   Blood pressure 117/78, pulse 90, temperature 98.8 F (37.1 C), temperature source Axillary, resp. rate 18, height '5\' 3"'$  (1.6 m), weight 70 kg, SpO2 100 %.    Vent Mode: PRVC FiO2 (%):  [50 %] 50 % Set Rate:  [18 bmp] 18 bmp Vt Set:  [450 mL] 450 mL PEEP:  [5 cmH20] 5  cmH20 Plateau Pressure:  [19 cmH20] 19 cmH20   Intake/Output Summary (Last 24 hours) at 05/31/2021 1808 Last data filed at 05/31/2021 1713 Gross per 24 hour  Intake 1000 ml  Output --  Net 1000 ml   Filed Weights   05/31/21 1600  Weight: 70 kg    Examination: General:  critically ill appearing on mech vent HEENT: MM pink/moist; ETT in place Neuro: propofol paused for neuro exam; not responding to painful stimuli; no cough/gag reflex; sluggish pupils bilaterally 2 mm CV: s1s2, RRR, no m/r/g; on cleviprex PULM:  dim clear BS bilaterally; on mech vent PRVC GI: soft, bsx4 active  Extremities: warm/dry, no edema  Skin: no rashes or lesions   Labs: Glucose 193 WBC 14.9 BUN 59, Creat 6.65   CTA head and neck Large area of parenchymal hemorrhage centered within the right basal ganglia region and surrounding white matter. Subarachnoid extension is present into adjacent sulci and ventricular system. Small volume contralateral subarachnoid hemorrhage likely reflects recirculation an absence of trauma.   Mass effect including 8 mm leftward midline shift. No ventricle entrapment at this time.   No abnormal vascularity in the area of hemorrhage.  No aneurysm.   No hemodynamically significant stenosis in the neck. Irregularity and moderate stenosis of the proximal to mid right posterior communicating artery.   Patchy ground-glass density in the included upper lobes. May reflect atelectasis, pneumonia, or edema.   Enlarged, heterogeneous thyroid. Ultrasound is recommended as an outpatient if not previously performed.   Resolved Hospital Problem  list     Assessment & Plan:  Large R parenchymal hemorrhage in R basal ganglia w/ 8 mm leftward shift Hypertensive emergency P: -Admit to ICU for telemetry monitoring and frequent neurochecks -Neuro primary -BP goals per neuro: continue cleviprex for SBP goal 120-140 -continue hypertonic saline per neuro; frequent Na  checks -MRI in 12 hours -Check A1c, lipid panel, TSH, troponin, lactic acid -statin per neuro -Check echo -SCDs for VTE PPx -NIHSS per protocol -PT/OT/SLP -avoid fever; prn tylenol  Acute respiratory failure requiring mechanical ventilation due to above P: -Continue mechanical ventilation PRVC 6 to 8 cc/kg -Check ABG -Wean FiO2 for sats greater than 92% -VAP prevention in place -Continue sedation for RASS 0 to -1 -Daily SBT/SAT  AKI on CKD IV P: -Trend BMP / urinary output -Replace electrolytes as indicated -Avoid nephrotoxic agents, ensure adequate renal perfusion  Leukocytosis P: -trend WBC/fever curve -check UA -CXR appears clear  Hyperglycemia P: -check a1c -glucose goal 140-180 -SSI and CBG monitoring  GERD P: -PPI  Hx of depression P: -consider restarting prozac tomorrow  Hx of HTN P: -hold home meds; currently on cleviprex drip  Best Practice (right click and "Reselect all SmartList Selections" daily)   Diet/type: NPO w/ meds via tube DVT prophylaxis: SCD GI prophylaxis: PPI Lines: N/A Foley:  Yes, and it is still needed Code Status:  full code Last date of multidisciplinary goals of care discussion [per primary]  Labs   CBC: Recent Labs  Lab 05/31/21 1600 05/31/21 1609  WBC 14.9*  --   NEUTROABS 12.6*  --   HGB 13.6 13.6  HCT 40.9 40.0  MCV 88.0  --   PLT 176  --     Basic Metabolic Panel: Recent Labs  Lab 05/31/21 1600 05/31/21 1609  NA 142 144  K 3.8 3.9  CL 112* 115*  CO2 14*  --   GLUCOSE 193* 188*  BUN 59* 56*  CREATININE 6.65* 6.90*  CALCIUM 9.1  --    GFR: Estimated Creatinine Clearance: 7.7 mL/min (A) (by C-G formula based on SCr of 6.9 mg/dL (H)). Recent Labs  Lab 05/31/21 1600  WBC 14.9*    Liver Function Tests: Recent Labs  Lab 05/31/21 1600  AST 61*  ALT 25  ALKPHOS 68  BILITOT 0.8  PROT 6.8  ALBUMIN 3.2*   No results for input(s): LIPASE, AMYLASE in the last 168 hours. No results for  input(s): AMMONIA in the last 168 hours.  ABG    Component Value Date/Time   TCO2 17 (L) 05/31/2021 1609     Coagulation Profile: Recent Labs  Lab 05/31/21 1600  INR 1.1    Cardiac Enzymes: No results for input(s): CKTOTAL, CKMB, CKMBINDEX, TROPONINI in the last 168 hours.  HbA1C: Hgb A1c MFr Bld  Date/Time Value Ref Range Status  08/13/2020 04:18 PM 6.3 (H) 4.8 - 5.6 % Final    Comment:    (NOTE) Pre diabetes:          5.7%-6.4%  Diabetes:              >6.4%  Glycemic control for   <7.0% adults with diabetes     CBG: Recent Labs  Lab 05/31/21 1558  GLUCAP 180*    Review of Systems:   Unable to obtain.  Patient intubated/sedated.  Obtain information from chart review and bedside nurse.  Past Medical History:  She,  has a past medical history of Hypertension and Stroke ([place]).   Surgical History:  History reviewed.  No pertinent surgical history.   Social History:      Family History:  Her family history is not on file.   Allergies No Known Allergies   Home Medications  Prior to Admission medications   Medication Sig Start Date End Date Taking? Authorizing Provider  amLODipine (NORVASC) 2.5 MG tablet Take 1 tablet (2.5 mg total) by mouth 2 (two) times daily. 08/21/20   Regalado, Jerald Kief A, MD  aspirin EC 81 MG tablet Take 81 mg by mouth daily. Swallow whole.    [provider]  atorvastatin (LIPITOR) 40 MG tablet Take 1 tablet (40 mg total) by mouth daily. 08/22/20   Regalado, Belkys A, MD  carvedilol (COREG) 6.25 MG tablet Take 1 tablet (6.25 mg total) by mouth 2 (two) times daily with a meal. 08/21/20   Regalado, Belkys A, MD  clopidogrel (PLAVIX) 75 MG tablet Take 75 mg by mouth daily.    [provider]  FLUoxetine (PROZAC) 40 MG capsule Take 80 mg by mouth daily.    [provider]  furosemide (LASIX) 80 MG tablet Take 1 tablet (80 mg total) by mouth daily. 08/25/20   Domenic Polite, MD  pantoprazole (PROTONIX) 40 MG tablet Take  1 tablet (40 mg total) by mouth daily. 08/22/20   Regalado, Belkys A, MD  sodium bicarbonate 650 MG tablet Take 1 tablet (650 mg total) by mouth 2 (two) times daily. 08/21/20   Regalado, Belkys A, MD  Vitamin D, Ergocalciferol, (DRISDOL) 1.25 MG (50000 UNIT) CAPS capsule Take 50,000 Units by mouth every 7 (seven) days.    [provider]     Critical care time: 45 minutes       JD Geryl [facility] 05/31/2021, 6:08 PM  Please see [email or web address] for pager details.  From 7A-7P if no response, please call [phone]. After hours, please call ELink [phone].

## 2021-05-31 NOTE — Code Documentation (Signed)
Stroke Response Nurse Documentation Code Documentation  Christina Nunez is a 65 y.o. female arriving to [facility] ED via [facility] on 05/31/21 with past medical hx of Stroke, HTN. On No antithrombotic. Code stroke was activated by ED RN.    Patient from home where she was LKW at 0750 and now complaining of unresponsiveness. Patient did not answer her phone and [facility] RN could not get into the house. She called family and they found pt unresponsive next to her bed. EMS was called. Pt with seizure activity en route and given 2.5 mg Versed.   Pt taken to [facility] and intubated for airway protection and then cleared by EDP for CT. Patient to CT with team. NIHSS 32, see documentation for details and code stroke times. NIHSS completed after pt intubated therefore limited exam. The following imaging was completed:  CT/CTA head and neck. Patient is not a candidate for IV Thrombolytic due to [place] on scan. Patient is not a candidate for IR due to no LVO. Cleviprex gtt initiated. 3% saline bolus given and continuous gtt started.   Care/Plan: BP<140, VS/mNIHSS/pupils q1 hour.   Bedside handoff with ED RN Lovena Le.    Mari Battaglia, Rande Brunt  Stroke Response RN

## 2021-05-31 NOTE — ED Provider Notes (Signed)
[facility] EMERGENCY DEPARTMENT Provider Note   CSN: [id number] Arrival date & time: 05/31/21  1550  An emergency department physician performed an initial assessment on this suspected stroke patient at 88.  History Chief Complaint  Patient presents with   Altered Mental Status    Christina Nunez is a 65 y.o. female.   Altered Mental Status Patient presents for altered mental status.  She was last seen normal at 7:50 AM.  Family had left to work.  Home health attempted to call her but she did not respond to phone calls.  When I went to check on her, she was found unresponsive on the floor.  She has normal respirations.  She was able to maintain a normal SPO2, even on room air.  During transit, she had some seizure-like activity.  She was given 2.5 mg IV Versed.  She was kept on a nonrebreather.  Blood sugar was normal.  Known medical history is limited.  It was reported that she had an admission before for stroke and hypertensive emergency in the past.    Past Medical History:  Diagnosis Date   Hypertension    Stroke [facility])     Patient Active Problem List   Diagnosis Date Noted   ICH (intracerebral hemorrhage) ([place]) 05/31/2021   Endotracheally intubated    Acute respiratory failure ([place])    Hyperglycemia    Acute ischemic stroke ([place]) 08/16/2020   Memory deficit 08/14/2020   AKI (acute kidney injury) ([place]) 08/14/2020   History of ischemic heart disease 08/14/2020   Depression 08/14/2020   Hypertensive urgency 08/14/2020   Essential hypertension 08/13/2020   Hypertensive emergency 08/13/2020    History reviewed. No pertinent surgical history.   OB History   No obstetric history on file.     No family history on file.     Home Medications Prior to Admission medications   Medication Sig Start Date End Date Taking? Authorizing Provider  amLODipine (NORVASC) 2.5 MG tablet Take 1 tablet (2.5 mg total) by mouth 2 (two) times daily. 08/21/20    Regalado, Jerald Kief A, MD  aspirin EC 81 MG tablet Take 81 mg by mouth daily. Swallow whole.    [provider]  atorvastatin (LIPITOR) 40 MG tablet Take 1 tablet (40 mg total) by mouth daily. 08/22/20   Regalado, Belkys A, MD  carvedilol (COREG) 6.25 MG tablet Take 1 tablet (6.25 mg total) by mouth 2 (two) times daily with a meal. 08/21/20   Regalado, Belkys A, MD  clopidogrel (PLAVIX) 75 MG tablet Take 75 mg by mouth daily.    [provider]  FLUoxetine (PROZAC) 40 MG capsule Take 80 mg by mouth daily. Patient not taking: Reported on 06/01/2021    [provider]  furosemide (LASIX) 80 MG tablet Take 1 tablet (80 mg total) by mouth daily. 08/25/20   Domenic Polite, MD  pantoprazole (PROTONIX) 40 MG tablet Take 1 tablet (40 mg total) by mouth daily. 08/22/20   Regalado, Belkys A, MD  sodium bicarbonate 650 MG tablet Take 1 tablet (650 mg total) by mouth 2 (two) times daily. 08/21/20   Regalado, Belkys A, MD  Vitamin D, Ergocalciferol, (DRISDOL) 1.25 MG (50000 UNIT) CAPS capsule Take 50,000 Units by mouth every 7 (seven) days.    [provider]    Allergies    Patient has no known allergies.  Review of Systems   Review of Systems  Unable to perform ROS: Patient unresponsive   Physical Exam Updated Vital  Signs BP (!) 142/75 (BP Location: Right Arm)   Pulse 88   Temp 99.8 F (37.7 C) (Axillary)   Resp (!) 23   Ht _0  (1.6 m)   Wt 70 kg   SpO2 99%   BMI 27.34 kg/m   Physical Exam Constitutional:      Appearance: She is well-developed and normal weight.     Interventions: Cervical collar in place.  HENT:     Head: Normocephalic and atraumatic.     Right Ear: External ear normal.     Left Ear: External ear normal.     Nose: Nose normal.     Mouth/Throat:     Comments: Intraoral trauma suggestive of tongue bite.  Evidence of vomitus outside of her mouth. Eyes:     General: No scleral icterus.    Conjunctiva/sclera: Conjunctivae normal.      Comments: Pupils 2 mm and unreactive, slightly greater on the right.  Neck:     Comments: Cervical collar in place Cardiovascular:     Rate and Rhythm: Tachycardia present.     Heart sounds: Normal heart sounds. No murmur heard. Pulmonary:     Comments: Tachypneic in the range of 30.  Transmitted upper airway sounds. Abdominal:     Palpations: Abdomen is soft. There is no mass.  Musculoskeletal:     Cervical back: Neck supple.     Right lower leg: No edema.     Left lower leg: No edema.  Skin:    General: Skin is cool and moist.     Coloration: Skin is not pale.     Findings: No bruising.  Neurological:     GCS: GCS eye subscore is 1. GCS verbal subscore is 1. GCS motor subscore is 1.    ED Results / Procedures / Treatments   Labs (all labs ordered are listed, but only abnormal results are displayed) Labs Reviewed  CBC - Abnormal; Notable for the following components:      Result Value   WBC 14.9 (*)    All other components within normal limits  DIFFERENTIAL - Abnormal; Notable for the following components:   Neutro Abs 12.6 (*)    Abs Immature Granulocytes 0.14 (*)    All other components within normal limits  COMPREHENSIVE METABOLIC PANEL - Abnormal; Notable for the following components:   Chloride 112 (*)    CO2 14 (*)    Glucose, Bld 193 (*)    BUN 59 (*)    Creatinine, Ser 6.65 (*)    Albumin 3.2 (*)    AST 61 (*)    GFR, Estimated 6 (*)    Anion gap 16 (*)    All other components within normal limits  SODIUM - Abnormal; Notable for the following components:   Sodium 150 (*)    All other components within normal limits  HEMOGLOBIN A1C - Abnormal; Notable for the following components:   Hgb A1c MFr Bld 6.0 (*)    All other components within normal limits  PHOSPHORUS - Abnormal; Notable for the following components:   Phosphorus 5.3 (*)    All other components within normal limits  CBC - Abnormal; Notable for the following components:   WBC 20.9 (*)    RBC  3.78 (*)    Hemoglobin 11.1 (*)    HCT 33.3 (*)    Platelets 141 (*)    All other components within normal limits  TRIGLYCERIDES - Abnormal; Notable for the following components:   Triglycerides 184 (*)  All other components within normal limits  LACTIC ACID, PLASMA - Abnormal; Notable for the following components:   Lactic Acid, Venous 3.1 (*)    All other components within normal limits  LIPID PANEL - Abnormal; Notable for the following components:   Triglycerides 179 (*)    HDL 33 (*)    All other components within normal limits  BASIC METABOLIC PANEL - Abnormal; Notable for the following components:   Sodium 153 (*)    Chloride 126 (*)    CO2 14 (*)    Glucose, Bld 214 (*)    BUN 63 (*)    Creatinine, Ser 6.76 (*)    Calcium 8.3 (*)    GFR, Estimated 6 (*)    All other components within normal limits  GLUCOSE, CAPILLARY - Abnormal; Notable for the following components:   Glucose-Capillary 148 (*)    All other components within normal limits  GLUCOSE, CAPILLARY - Abnormal; Notable for the following components:   Glucose-Capillary 180 (*)    All other components within normal limits  GLUCOSE, CAPILLARY - Abnormal; Notable for the following components:   Glucose-Capillary 195 (*)    All other components within normal limits  GLUCOSE, CAPILLARY - Abnormal; Notable for the following components:   Glucose-Capillary 199 (*)    All other components within normal limits  URINALYSIS, ROUTINE W REFLEX MICROSCOPIC - Abnormal; Notable for the following components:   APPearance HAZY (*)    Specific Gravity, Urine 1.034 (*)    Glucose, UA 150 (*)    Hgb urine dipstick MODERATE (*)    Ketones, ur 5 (*)    Protein, ur >=300 (*)    All other components within normal limits  GLUCOSE, CAPILLARY - Abnormal; Notable for the following components:   Glucose-Capillary 170 (*)    All other components within normal limits  CBG MONITORING, ED - Abnormal; Notable for the following  components:   Glucose-Capillary 180 (*)    All other components within normal limits  I-STAT CHEM 8, ED - Abnormal; Notable for the following components:   Chloride 115 (*)    BUN 56 (*)    Creatinine, Ser 6.90 (*)    Glucose, Bld 188 (*)    Calcium, Ion 1.07 (*)    TCO2 17 (*)    All other components within normal limits  POCT I-STAT 7, (LYTES, BLD GAS, ICA,H+H) - Abnormal; Notable for the following components:   pH, Arterial 7.321 (*)    pCO2 arterial 30.5 (*)    Bicarbonate 15.7 (*)    TCO2 17 (*)    Acid-base deficit 9.0 (*)    Sodium 149 (*)    HCT 33.0 (*)    Hemoglobin 11.2 (*)    All other components within normal limits  TROPONIN I (HIGH SENSITIVITY) - Abnormal; Notable for the following components:   Troponin I (High Sensitivity) 18,301 (*)    All other components within normal limits  RESP PANEL BY RT-PCR (FLU A&B, COVID) ARPGX2  MRSA NEXT GEN BY PCR, NASAL  PROTIME-INR  APTT  SODIUM  MAGNESIUM  ETHANOL  TSH  LACTIC ACID, PLASMA  BLOOD GAS, ARTERIAL  RAPID URINE DRUG SCREEN, HOSP PERFORMED  SODIUM  SODIUM  CBG MONITORING, ED    EKG EKG Interpretation  Date/Time:  Friday May 31 2021 15:52:58 EDT Ventricular Rate:  100 PR Interval:  140 QRS Duration: 79 QT Interval:  388 QTC Calculation: 501 R Axis:   248 Text Interpretation: Sinus tachycardia Left atrial  enlargement Left anterior fascicular block Consider anterior infarct Abnrm T, consider ischemia, anterolateral lds Prolonged QT interval Confirmed by Godfrey Pick [phone]) on 05/31/2021 4:29:55 PM  Radiology CT HEAD WO CONTRAST  Result Date: 05/31/2021 CLINICAL DATA:  Parenchymal hemorrhage follow-up EXAM: CT HEAD WITHOUT CONTRAST TECHNIQUE: Contiguous axial images were obtained from the base of the skull through the vertex without intravenous contrast. COMPARISON:  05/31/2021 at 4:24 p.m. FINDINGS: Brain: Unchanged appearance of massive intraparenchymal hematoma centered in the right basal ganglia  with leftward midline shift of approximately 8 mm. There is subarachnoid blood again seen over both hemispheres. The size and configuration of the ventricles are unchanged. Vascular: No abnormal hyperdensity of the major intracranial arteries or dural venous sinuses. No intracranial atherosclerosis. Skull: The visualized skull base, calvarium and extracranial soft tissues are normal. Sinuses/Orbits: No fluid levels or advanced mucosal thickening of the visualized paranasal sinuses. No mastoid or middle ear effusion. The orbits are normal. IMPRESSION: 1. Unchanged appearance of massive intraparenchymal hematoma centered in the right basal ganglia with leftward midline shift of approximately 8 mm. 2. Unchanged subarachnoid blood over both hemispheres. Electronically Signed   By: Ulyses Jarred M.D.   On: 05/31/2021 22:24   DG CHEST PORT 1 VIEW  Result Date: 05/31/2021 CLINICAL DATA:  Check gastric catheter placement EXAM: PORTABLE CHEST 1 VIEW COMPARISON:  Film from earlier in the same day. FINDINGS: Endotracheal tube and gastric catheter are again noted in satisfactory position. Cardiac shadow is stable. Postsurgical changes are again seen. Lungs are clear. No bony abnormality is noted. IMPRESSION: Tubes and lines as described above.  No acute abnormality noted. Electronically Signed   By: Inez Catalina M.D.   On: 05/31/2021 19:42   DG Chest Portable 1 View  Result Date: 05/31/2021 CLINICAL DATA:  Status post intubation. EXAM: PORTABLE CHEST 1 VIEW COMPARISON:  August 23, 2020 FINDINGS: An endotracheal tube is seen with its distal tip approximately 2.1 cm from the carina. A nasogastric tube is noted with its distal end within the body of the stomach. Multiple sternal wires and vascular clips are seen. The heart size and mediastinal contours are within normal limits. Both lungs are clear. The visualized skeletal structures are unremarkable. IMPRESSION: 1. Endotracheal tube positioning, as described above. 2.  No acute or active cardiopulmonary disease. Electronically Signed   By: Virgina Norfolk M.D.   On: 05/31/2021 16:35   ECHOCARDIOGRAM COMPLETE  Result Date: 06/01/2021    ECHOCARDIOGRAM REPORT   Patient Name:   Christina Nunez Date of Exam: 06/01/2021 Medical Rec #:  [id number]    Height:       63.0 in Accession #:    [id number]   Weight:       154.3 lb Date of Birth:  1955/11/18    BSA:          1.732 m Patient Age:    60 years     BP:           140/78 mmHg Patient Gender: F            HR:           93 bpm. Exam Location:  Inpatient Procedure: 2D Echo, Cardiac Doppler and Color Doppler Indications:    Stroke  History:        Patient has prior history of Echocardiogram examinations, most                 recent 08/14/2020. CHF.  Sonographer:    [place]  Referring Phys: [place]  1. Left ventricular ejection fraction, by estimation, is 60 to 65%. The left ventricle has normal function. The left ventricle has no regional wall motion abnormalities. Left ventricular diastolic parameters are consistent with Grade I diastolic dysfunction (impaired relaxation). Elevated left ventricular end-diastolic pressure. The E/e' is 28.  2. Right ventricular systolic function is normal. The right ventricular size is normal. Tricuspid regurgitation signal is inadequate for assessing PA pressure.  3. Left atrial size was mildly dilated.  4. The mitral valve is grossly normal. Trivial mitral valve regurgitation. No evidence of mitral stenosis.  5. The aortic valve is tricuspid. Aortic valve regurgitation is not visualized. No aortic stenosis is present.  6. The inferior vena cava is normal in size with greater than 50% respiratory variability, suggesting right atrial pressure of 3 mmHg. Comparison(s): No significant change from prior study. EF normal. No WMA. Postop septum. FINDINGS  Left Ventricle: Left ventricular ejection fraction, by estimation, is 60 to 65%. The left ventricle has normal  function. The left ventricle has no regional wall motion abnormalities. The left ventricular internal cavity size was normal in size. There is  no left ventricular hypertrophy. Abnormal (paradoxical) septal motion consistent with post-operative status. Left ventricular diastolic parameters are consistent with Grade I diastolic dysfunction (impaired relaxation). Elevated left ventricular end-diastolic pressure. The E/e' is 2. Right Ventricle: The right ventricular size is normal. No increase in right ventricular wall thickness. Right ventricular systolic function is normal. Tricuspid regurgitation signal is inadequate for assessing PA pressure. Left Atrium: Left atrial size was mildly dilated. Right Atrium: Right atrial size was normal in size. Pericardium: There is no evidence of pericardial effusion. Presence of pericardial fat pad. Mitral Valve: The mitral valve is grossly normal. Trivial mitral valve regurgitation. No evidence of mitral valve stenosis. Tricuspid Valve: The tricuspid valve is grossly normal. Tricuspid valve regurgitation is trivial. No evidence of tricuspid stenosis. Aortic Valve: The aortic valve is tricuspid. Aortic valve regurgitation is not visualized. No aortic stenosis is present. Aortic valve mean gradient measures 5.0 mmHg. Aortic valve peak gradient measures 8.8 mmHg. Pulmonic Valve: The pulmonic valve was grossly normal. Pulmonic valve regurgitation is not visualized. No evidence of pulmonic stenosis. Aorta: The aortic root is normal in size and structure. Venous: The right lower pulmonary vein is normal. The inferior vena cava is normal in size with greater than 50% respiratory variability, suggesting right atrial pressure of 3 mmHg. IAS/Shunts: The atrial septum is grossly normal.  LEFT VENTRICLE PLAX 2D LVIDd:         3.00 cm Diastology LVIDs:         2.10 cm LV e' medial:    3.23 cm/s LV PW:         1.10 cm LV E/e' medial:  22.0 LV IVS:        1.00 cm LV e' lateral:   4.35 cm/s                         LV E/e' lateral: 16.3  RIGHT VENTRICLE RV Basal diam:  2.90 cm LEFT ATRIUM             Index        RIGHT ATRIUM           Index LA diam:        3.60 cm 2.08 cm/m   RA Area:     16.70 cm LA Vol (A2C):   71.6  ml 41.34 ml/m  RA Volume:   42.10 ml  24.31 ml/m LA Vol (A4C):   52.5 ml 30.31 ml/m LA Biplane Vol: 64.6 ml 37.30 ml/m  AORTIC VALVE AV Vmax:           148.00 cm/s AV Vmean:          96.400 cm/s AV VTI:            0.236 m AV Peak Grad:      8.8 mmHg AV Mean Grad:      5.0 mmHg LVOT Vmax:         129.00 cm/s LVOT Vmean:        79.200 cm/s LVOT VTI:          0.194 m LVOT/AV VTI ratio: 0.82  AORTA Ao Root diam: 2.90 cm MITRAL VALVE MV Area (PHT): 4.41 cm     SHUNTS MV Decel Time: 172 msec     Systemic VTI: 0.19 m MV E velocity: 71.00 cm/s MV A velocity: 104.00 cm/s MV E/A ratio:  0.68 Eleonore Chiquito MD Electronically signed by Eleonore Chiquito MD Signature Date/Time: 06/01/2021/12:02:37 PM    Final    CT ANGIO HEAD NECK W WO CM (CODE STROKE)  Result Date: 05/31/2021 CLINICAL DATA:  Neuro deficit, acute, stroke suspected EXAM: CT ANGIOGRAPHY HEAD AND NECK TECHNIQUE: Multidetector CT imaging of the head and neck was performed using the standard protocol during bolus administration of intravenous contrast. Multiplanar CT image reconstructions and MIPs were obtained to evaluate the vascular anatomy. Carotid stenosis measurements (when applicable) are obtained utilizing NASCET criteria, using the distal internal carotid diameter as the denominator. CONTRAST:  65m OMNIPAQUE IOHEXOL 350 MG/ML SOLN COMPARISON:  None. FINDINGS: CT HEAD Brain: There is a large area of hemorrhage within the right cerebral hemisphere centered within the basal ganglia and surrounding white matter. Approximate measurements of 5.8 x 4.5 x 5.6 cm. There is adjacent sulcal subarachnoid hemorrhage with involvement of sylvian fissure and right MCA cistern. Some contralateral sulcal subarachnoid hemorrhage is present and  could be post-traumatic or reflect recirculation. Intraventricular extension is present. There is surrounding edema with regional mass effect. Leftward midline shift is present measuring approximately 8 mm. There is no ventricle trapping at this time. No significant central herniation. Vascular: Better evaluated on CTA portion. Skull: Calvarium is unremarkable. Sinuses/Orbits: No acute finding. Other: None. Review of the MIP images confirms the above findings CTA NECK Aortic arch: Great vessel origins are patent. There is mixed plaque along the proximal left greater than right subclavian arteries without significant stenosis. Right carotid system: Patent. Mild primarily calcified plaque along the proximal internal carotid with minimal stenosis. Left carotid system: Patent. Trace calcified plaque along the proximal internal carotid without stenosis. Vertebral arteries: Patent.  Codominant.  No stenosis. Skeleton: No significant abnormality. Other neck: Enlarged, heterogeneous thyroid. Endotracheal and enteric tubes are present. Upper chest: Patchy ground-glass density in the included upper lobes. Review of the MIP images confirms the above findings CTA HEAD Anterior circulation: Intracranial internal carotid arteries are patent with calcified plaque causing mild stenosis, greater on the right. Anterior and middle cerebral arteries are patent. No aneurysm identified. Posterior circulation: Intracranial vertebral arteries are patent. Basilar artery is patent. Major cerebellar artery origins are patent. Bilateral posterior communicating arteries are present. There is irregularity and moderate stenosis of the proximal to mid right PCOM. Posterior cerebral arteries are patent. No aneurysm identified. Venous sinuses: Patent as allowed by contrast bolus timing. Review of the MIP images confirms  the above findings IMPRESSION: Large area of parenchymal hemorrhage centered within the right basal ganglia region and surrounding  white matter. Subarachnoid extension is present into adjacent sulci and ventricular system. Small volume contralateral subarachnoid hemorrhage likely reflects recirculation an absence of trauma. Mass effect including 8 mm leftward midline shift. No ventricle entrapment at this time. No abnormal vascularity in the area of hemorrhage.  No aneurysm. No hemodynamically significant stenosis in the neck. Irregularity and moderate stenosis of the proximal to mid right posterior communicating artery. Patchy ground-glass density in the included upper lobes. May reflect atelectasis, pneumonia, or edema. Enlarged, heterogeneous thyroid. Ultrasound is recommended as an outpatient if not previously performed. Initial emergent results were called by telephone at the time of interpretation on 05/31/2021 at 4:39 pm to provider Godfrey Pick , who verbally acknowledged these results. Electronically Signed   By: Macy Mis M.D.   On: 05/31/2021 16:55    Procedures Procedure Name: Intubation Date/Time: 05/31/2021 4:15 PM Performed by: Godfrey Pick, MD Pre-anesthesia Checklist: Suction available, Emergency Drugs available, Patient being monitored and Patient identified Oxygen Delivery Method: Non-rebreather mask Preoxygenation: Pre-oxygenation with 100% oxygen Induction Type: Rapid sequence Ventilation: Nasal airway inserted- appropriate to patient size Laryngoscope Size: Mac and 3 Grade View: Grade I Tube size: 7.5 mm Number of attempts: 1 Airway Equipment and Method: Video-laryngoscopy Placement Confirmation: ETT inserted through vocal cords under direct vision, Positive ETCO2, CO2 detector and Breath sounds checked- equal and bilateral Secured at: 24 cm Tube secured with: ETT holder Dental Injury: Teeth and Oropharynx as per pre-operative assessment  Future Recommendations: Recommend- induction with short-acting agent, and alternative techniques readily available      Medications Ordered in ED Medications   etomidate (AMIDATE) injection (20 mg Intravenous Given 05/31/21 1556)  rocuronium (ZEMURON) injection (90 mg Intravenous Given 05/31/21 1557)  sodium chloride (hypertonic) 3 % solution ( Intravenous Infusion Verify 06/01/21 1200)   stroke: mapping our early stages of recovery book (has no administration in time range)  acetaminophen (TYLENOL) tablet 650 mg ( Oral See Alternative 06/01/21 1222)    Or  acetaminophen (TYLENOL) 160 MG/5ML solution 650 mg (650 mg Per Tube Given 06/01/21 1222)    Or  acetaminophen (TYLENOL) suppository 650 mg ( Rectal See Alternative 06/01/21 1222)  pantoprazole (PROTONIX) injection 40 mg (40 mg Intravenous Given 05/31/21 2300)  clevidipine (CLEVIPREX) infusion 0.5 mg/mL (4 mg/hr Intravenous Infusion Verify 06/01/21 1200)  docusate (COLACE) 50 MG/5ML liquid 100 mg (100 mg Per Tube Given 06/01/21 1010)  polyethylene glycol (MIRALAX / GLYCOLAX) packet 17 g (17 g Per Tube Given 06/01/21 1010)  fentaNYL (SUBLIMAZE) injection 50 mcg (has no administration in time range)  fentaNYL (SUBLIMAZE) injection 50-200 mcg (has no administration in time range)  chlorhexidine gluconate (MEDLINE KIT) (PERIDEX) 0.12 % solution 15 mL (15 mLs Mouth Rinse Given 06/01/21 0731)  MEDLINE mouth rinse (15 mLs Mouth Rinse Given 06/01/21 1215)  insulin aspart (novoLOG) injection 0-6 Units (1 Units Subcutaneous Given 06/01/21 1221)  Chlorhexidine Gluconate Cloth 2 % PADS 6 each (has no administration in time range)  carvedilol (COREG) tablet 6.25 mg (has no administration in time range)  amLODipine (NORVASC) tablet 5 mg (5 mg Per Tube Given 06/01/21 1010)  senna-docusate (Senokot-S) tablet 1 tablet (1 tablet Per Tube Given 06/01/21 1021)  feeding supplement (VITAL HIGH PROTEIN) liquid 1,000 mL (has no administration in time range)  sodium chloride flush (NS) 0.9 % injection 3 mL (3 mLs Intravenous Given 05/31/21 1550)  sodium chloride 0.9 % bolus  1,000 mL (0 mLs Intravenous Stopped 05/31/21  1713)  sodium chloride 3% (hypertonic) IV bolus 250 mL (0 mLs Intravenous Stopped 05/31/21 1630)  iohexol (OMNIPAQUE) 350 MG/ML injection 70 mL (70 mLs Intravenous Contrast Given 05/31/21 1628)  albumin human 25 % solution 12.5 g (12.5 g Intravenous New Bag/Given 05/31/21 2304)    ED Course  I have reviewed the triage vital signs and the nursing notes.  Pertinent labs & imaging results that were available during my care of the patient were reviewed by me and considered in my medical decision making (see chart for details).    MDM Rules/Calculators/A&P                         CRITICAL CARE Performed by: Godfrey Pick   Total critical care time: 35 minutes  Critical care time was exclusive of separately billable procedures and treating other patients.  Critical care was necessary to treat or prevent imminent or life-threatening deterioration.  Critical care was time spent personally by me on the following activities: development of treatment plan with patient and/or surrogate as well as nursing, discussions with consultants, evaluation of patient's response to treatment, examination of patient, obtaining history from patient or surrogate, ordering and performing treatments and interventions, ordering and review of laboratory studies, ordering and review of radiographic studies, pulse oximetry and re-evaluation of patient's condition.   Patient arrives unresponsive.  Etiology of altered mental status is unknown.  There is report of a hospitalization earlier in the year for stroke and hypertensive emergency.  GCS of 3 on arrival.  Vital signs notable for tachypnea, hypertension, and tachycardia.  Per family, last seen normal at 7:50 AM.  Patient was intubated for airway protection.  Code stroke was called.  Patient underwent emergent CT scanning which showed a large parenchymal hemorrhage centered in the right basal ganglia region.  Hemorrhage extends into the ventricular system as well as the  subarachnoid space.  There is approximately 8 mm of leftward midline shift.  No downward herniation is appreciated at this time.  Neurology placed orders for hypertonic saline, Cleviprex gtt., and admission to the neuro ICU.  Blood pressure goals will be less than 140 SBP.  Following CT scan, patient's vital signs are within goal.  Her sister arrived in the ED and was brought to the patient's bedside.  She was updated on diagnostic findings.  She was able to provide additional history which is as follows: Patient was found on her bed.  She was unresponsive with snoring respirations at that time.  Patient's sister brought her to the floor at the advice of the 911 dispatcher, in order to perform CPR if needed.  There was no evidence at the home that the patient suffered a fall.  Patient's sister confirmed that the patient was in her normal state of health this morning.  Patient was transported to neuro ICU for further management.  Final Clinical Impression(s) / ED Diagnoses Final diagnoses:  Intracranial hemorrhage [facility])    Rx / DC Orders ED Discharge Orders     None        Godfrey Pick, MD 06/01/21 1249

## 2021-05-31 NOTE — Consult Note (Signed)
Cardiology Consultation:   Patient ID: Christina Nunez MRN: [id number]; DOB: 12-31-1955  Admit date: 05/31/2021 Date of Consult: 05/31/2021  PCP:  Medicine, [place] Providers Cardiologist:  None        Patient Profile:    Christina Nunez is a 65 yo female with a PMHx of mild dementia, CKD IV, pre DM, HLD, ischemic stroke, HTN, and depression who is being seen 05/31/2021 for the evaluation of elevated troponin at the request of Dr Theda Sers.  History of Present Illness:    Christina Nunez is a 65 yo female with a PMHx of mild dementia, CKD IV, pre DM, HLD, ischemic stroke, HTN, and depression who is being seen 05/31/2021 for the evaluation of elevated troponin  Patient intubated and sedated in the ICU. History obtained from chart review. Patient was found unresponsive at home and brought to the ER for further eval. History is limited. Work up in the ER- CT head showed a big hemorrhage in the right cerebral hemisphere and is admitted to neuro ICU. Cards consulted for elevated trops.  EKG- TWI in anterior and lateral leads, NSR Trop 18,301 Lactic acid 3.1  Imaging results as below CTH There is a large area of hemorrhage within the right cerebral hemisphere centered within the basal ganglia and surrounding white matter. Approximate measurements of 5.8 x 4.5 x 5.6 cm. There is adjacent sulcal subarachnoid hemorrhage with involvement of sylvian fissure and right MCA cistern. Some contralateral sulcal subarachnoid hemorrhage is present and could be post-traumatic or reflect recirculation. Intraventricular extension is present. There is surrounding edema with regional mass effect. Leftward midline shift is present measuring approximately 8 mm. There is no ventricle trapping at this time. No significant central herniation.   CTA head and neck -Large area of parenchymal hemorrhage centered within the right basal ganglia region and surrounding white matter.  Subarachnoid extension is present into adjacent sulci and ventricular system.  -Small volume contralateral subarachnoid hemorrhage likely reflects recirculation an absence of trauma.  -Mass effect including 8 mm leftward midline shift. No ventricle entrapment at this time.  -No abnormal vascularity in the area of hemorrhage.  No aneurysm. -No hemodynamically significant stenosis in the neck. Irregularity and moderate stenosis of the proximal to mid right posterior communicating artery.   Past Medical History:  Diagnosis Date   Hypertension    Stroke [facility])     History reviewed. No pertinent surgical history.   Home Medications:  Prior to Admission medications   Medication Sig Start Date End Date Taking? Authorizing Provider  amLODipine (NORVASC) 2.5 MG tablet Take 1 tablet (2.5 mg total) by mouth 2 (two) times daily. 08/21/20   Regalado, Jerald Kief A, MD  aspirin EC 81 MG tablet Take 81 mg by mouth daily. Swallow whole.    [provider]  atorvastatin (LIPITOR) 40 MG tablet Take 1 tablet (40 mg total) by mouth daily. 08/22/20   Regalado, Belkys A, MD  carvedilol (COREG) 6.25 MG tablet Take 1 tablet (6.25 mg total) by mouth 2 (two) times daily with a meal. 08/21/20   Regalado, Belkys A, MD  clopidogrel (PLAVIX) 75 MG tablet Take 75 mg by mouth daily.    [provider]  FLUoxetine (PROZAC) 40 MG capsule Take 80 mg by mouth daily.    [provider]  furosemide (LASIX) 80 MG tablet Take 1 tablet (80 mg total) by mouth daily. 08/25/20   Domenic Polite, MD  pantoprazole (PROTONIX) 40 MG tablet Take 1  tablet (40 mg total) by mouth daily. 08/22/20   Regalado, Belkys A, MD  sodium bicarbonate 650 MG tablet Take 1 tablet (650 mg total) by mouth 2 (two) times daily. 08/21/20   Regalado, Belkys A, MD  Vitamin D, Ergocalciferol, (DRISDOL) 1.25 MG (50000 UNIT) CAPS capsule Take 50,000 Units by mouth every 7 (seven) days.    [provider]    Inpatient Medications: Scheduled  Meds:   stroke: mapping our early stages of recovery book   Does not apply Once   chlorhexidine gluconate (MEDLINE KIT)  15 mL Mouth Rinse BID   docusate  100 mg Per Tube BID   [START ON 06/01/2021] FLUoxetine  40 mg Per Tube Daily   insulin aspart  0-6 Units Subcutaneous Q4H   mouth rinse  15 mL Mouth Rinse 10 times per day   pantoprazole (PROTONIX) IV  40 mg Intravenous QHS   polyethylene glycol  17 g Per Tube Daily   senna-docusate  1 tablet Oral BID   Continuous Infusions:  clevidipine 6 mg/hr (05/31/21 2017)   clevidipine 4 mg/hr (05/31/21 1640)   propofol (DIPRIVAN) infusion     sodium chloride (hypertonic) 75 mL/hr at 05/31/21 1950   PRN Meds: acetaminophen **OR** acetaminophen (TYLENOL) oral liquid 160 mg/5 mL **OR** acetaminophen, clevidipine, etomidate, fentaNYL (SUBLIMAZE) injection, fentaNYL (SUBLIMAZE) injection, rocuronium  Allergies:   No Known Allergies  Social History:   Social History   Socioeconomic History   Marital status: Single    Spouse name: Not on file   Number of children: Not on file   Years of education: Not on file   Highest education level: Not on file  Occupational History   Not on file  Tobacco Use   Smoking status: Not on file   Smokeless tobacco: Not on file  Substance and Sexual Activity   Alcohol use: Not on file   Drug use: Not on file   Sexual activity: Not on file  Other Topics Concern   Not on file  Social History Narrative   Not on file   Social Determinants of Health   Financial Resource Strain: Not on file  Food Insecurity: Not on file  Transportation Needs: Not on file  Physical Activity: Not on file  Stress: Not on file  Social Connections: Not on file  Intimate Partner Violence: Not on file    Family History:   No family history on file.   ROS:  Please see the history of present illness.   All other ROS reviewed and negative.     Physical Exam/Data:   Vitals:   05/31/21 1800 05/31/21 1900 05/31/21 2000  05/31/21 2012  BP: 117/78  119/71 119/71  Pulse: 90 90 88 88  Resp: 18 18 (!) 21 20  Temp:   98 F (36.7 C)   TempSrc:   Oral   SpO2: 100% 100% 97%   Weight:      Height:        Intake/Output Summary (Last 24 hours) at 05/31/2021 2347 Last data filed at 05/31/2021 1900 Gross per 24 hour  Intake 1144 ml  Output 150 ml  Net 994 ml   Last 3 Weights 05/31/2021 08/24/2020 08/24/2020  Weight (lbs) 154 lb 5.2 oz 170 lb 6.7 oz 173 lb 11.6 oz  Weight (kg) 70 kg 77.3 kg 78.8 kg     Body mass index is 27.34 kg/m.  General: intubated and sedated HEENT: normal Neck: no JVD Vascular: No carotid bruits; Distal pulses 2+ bilaterally  Cardiac:  normal S1, S2; RRR; no murmur  Lungs:  clear to auscultation bilaterally, no wheezing, rhonchi or rales  Abd: soft, no hepatomegaly  Ext: no edema Musculoskeletal:  No deformities,  Skin: warm and dry  Neuro: intubated and sedated  EKG:  The EKG was personally reviewed and demonstrates:  TWI I and aVL and anterior V1-V2. Telemetry:  Telemetry was personally reviewed and demonstrates:  NSR  Relevant CV Studies: ECHO: 08/14/2020  1. Left ventricular ejection fraction, by estimation, is 50 to 55%. The  left ventricle has low normal function. The left ventricle demonstrates  regional wall motion abnormalities (see scoring diagram/findings for  description). There is mild concentric  left ventricular hypertrophy. Left ventricular diastolic parameters are  consistent with Grade II diastolic dysfunction (pseudonormalization).  Elevated left atrial pressure. There is mild hypokinesis of the left  ventricular, basal-mid inferior wall and  inferoseptal wall.   2. Right ventricular systolic function is normal. The right ventricular  size is normal. There is normal pulmonary artery systolic pressure.   3. Left atrial size was mildly dilated.   4. The mitral valve is normal in structure. Moderate mitral valve  regurgitation.   5. The aortic valve is  tricuspid. Aortic valve regurgitation is not  visualized. No aortic stenosis is present.   6. The inferior vena cava is normal in size with greater than 50%  respiratory variability, suggesting right atrial pressure of 3 mmHg.  Laboratory Data:  High Sensitivity Troponin:   Recent Labs  Lab 05/31/21 1902  TROPONINIHS 18,301*     Chemistry Recent Labs  Lab 05/31/21 1600 05/31/21 1609 05/31/21 1819 05/31/21 1902 05/31/21 2101  NA 142 144 149* 143 150*  K 3.8 3.9 3.5  --   --   CL 112* 115*  --   --   --   CO2 14*  --   --   --   --   GLUCOSE 193* 188*  --   --   --   BUN 59* 56*  --   --   --   CREATININE 6.65* 6.90*  --   --   --   CALCIUM 9.1  --   --   --   --   MG  --   --   --  2.2  --   GFRNONAA 6*  --   --   --   --   ANIONGAP 16*  --   --   --   --     Recent Labs  Lab 05/31/21 1600  PROT 6.8  ALBUMIN 3.2*  AST 61*  ALT 25  ALKPHOS 68  BILITOT 0.8   Lipids No results for input(s): CHOL, TRIG, HDL, LABVLDL, LDLCALC, CHOLHDL in the last 168 hours.  Hematology Recent Labs  Lab 05/31/21 1600 05/31/21 1609 05/31/21 1819  WBC 14.9*  --   --   RBC 4.65  --   --   HGB 13.6 13.6 11.2*  HCT 40.9 40.0 33.0*  MCV 88.0  --   --   MCH 29.2  --   --   MCHC 33.3  --   --   RDW 14.4  --   --   PLT 176  --   --    Thyroid  Recent Labs  Lab 05/31/21 1902  TSH 1.700    BNPNo results for input(s): BNP, PROBNP in the last 168 hours.  DDimer No results for input(s): DDIMER in the last 168 hours.  Radiology/Studies:  CT HEAD WO CONTRAST  Result Date: 05/31/2021 CLINICAL DATA:  Parenchymal hemorrhage follow-up EXAM: CT HEAD WITHOUT CONTRAST TECHNIQUE: Contiguous axial images were obtained from the base of the skull through the vertex without intravenous contrast. COMPARISON:  05/31/2021 at 4:24 p.m. FINDINGS: Brain: Unchanged appearance of massive intraparenchymal hematoma centered in the right basal ganglia with leftward midline shift of approximately 8 mm.  There is subarachnoid blood again seen over both hemispheres. The size and configuration of the ventricles are unchanged. Vascular: No abnormal hyperdensity of the major intracranial arteries or dural venous sinuses. No intracranial atherosclerosis. Skull: The visualized skull base, calvarium and extracranial soft tissues are normal. Sinuses/Orbits: No fluid levels or advanced mucosal thickening of the visualized paranasal sinuses. No mastoid or middle ear effusion. The orbits are normal. IMPRESSION: 1. Unchanged appearance of massive intraparenchymal hematoma centered in the right basal ganglia with leftward midline shift of approximately 8 mm. 2. Unchanged subarachnoid blood over both hemispheres. Electronically Signed   By: Ulyses Jarred M.D.   On: 05/31/2021 22:24   DG CHEST PORT 1 VIEW  Result Date: 05/31/2021 CLINICAL DATA:  Check gastric catheter placement EXAM: PORTABLE CHEST 1 VIEW COMPARISON:  Film from earlier in the same day. FINDINGS: Endotracheal tube and gastric catheter are again noted in satisfactory position. Cardiac shadow is stable. Postsurgical changes are again seen. Lungs are clear. No bony abnormality is noted. IMPRESSION: Tubes and lines as described above.  No acute abnormality noted. Electronically Signed   By: Inez Catalina M.D.   On: 05/31/2021 19:42   DG Chest Portable 1 View  Result Date: 05/31/2021 CLINICAL DATA:  Status post intubation. EXAM: PORTABLE CHEST 1 VIEW COMPARISON:  August 23, 2020 FINDINGS: An endotracheal tube is seen with its distal tip approximately 2.1 cm from the carina. A nasogastric tube is noted with its distal end within the body of the stomach. Multiple sternal wires and vascular clips are seen. The heart size and mediastinal contours are within normal limits. Both lungs are clear. The visualized skeletal structures are unremarkable. IMPRESSION: 1. Endotracheal tube positioning, as described above. 2. No acute or active cardiopulmonary disease.  Electronically Signed   By: Virgina Norfolk M.D.   On: 05/31/2021 16:35   CT ANGIO HEAD NECK W WO CM (CODE STROKE)  Result Date: 05/31/2021 CLINICAL DATA:  Neuro deficit, acute, stroke suspected EXAM: CT ANGIOGRAPHY HEAD AND NECK TECHNIQUE: Multidetector CT imaging of the head and neck was performed using the standard protocol during bolus administration of intravenous contrast. Multiplanar CT image reconstructions and MIPs were obtained to evaluate the vascular anatomy. Carotid stenosis measurements (when applicable) are obtained utilizing NASCET criteria, using the distal internal carotid diameter as the denominator. CONTRAST:  32m OMNIPAQUE IOHEXOL 350 MG/ML SOLN COMPARISON:  None. FINDINGS: CT HEAD Brain: There is a large area of hemorrhage within the right cerebral hemisphere centered within the basal ganglia and surrounding white matter. Approximate measurements of 5.8 x 4.5 x 5.6 cm. There is adjacent sulcal subarachnoid hemorrhage with involvement of sylvian fissure and right MCA cistern. Some contralateral sulcal subarachnoid hemorrhage is present and could be post-traumatic or reflect recirculation. Intraventricular extension is present. There is surrounding edema with regional mass effect. Leftward midline shift is present measuring approximately 8 mm. There is no ventricle trapping at this time. No significant central herniation. Vascular: Better evaluated on CTA portion. Skull: Calvarium is unremarkable. Sinuses/Orbits: No acute finding. Other: None. Review of the MIP images confirms the above findings CTA NECK Aortic  arch: Great vessel origins are patent. There is mixed plaque along the proximal left greater than right subclavian arteries without significant stenosis. Right carotid system: Patent. Mild primarily calcified plaque along the proximal internal carotid with minimal stenosis. Left carotid system: Patent. Trace calcified plaque along the proximal internal carotid without stenosis.  Vertebral arteries: Patent.  Codominant.  No stenosis. Skeleton: No significant abnormality. Other neck: Enlarged, heterogeneous thyroid. Endotracheal and enteric tubes are present. Upper chest: Patchy ground-glass density in the included upper lobes. Review of the MIP images confirms the above findings CTA HEAD Anterior circulation: Intracranial internal carotid arteries are patent with calcified plaque causing mild stenosis, greater on the right. Anterior and middle cerebral arteries are patent. No aneurysm identified. Posterior circulation: Intracranial vertebral arteries are patent. Basilar artery is patent. Major cerebellar artery origins are patent. Bilateral posterior communicating arteries are present. There is irregularity and moderate stenosis of the proximal to mid right PCOM. Posterior cerebral arteries are patent. No aneurysm identified. Venous sinuses: Patent as allowed by contrast bolus timing. Review of the MIP images confirms the above findings IMPRESSION: Large area of parenchymal hemorrhage centered within the right basal ganglia region and surrounding white matter. Subarachnoid extension is present into adjacent sulci and ventricular system. Small volume contralateral subarachnoid hemorrhage likely reflects recirculation an absence of trauma. Mass effect including 8 mm leftward midline shift. No ventricle entrapment at this time. No abnormal vascularity in the area of hemorrhage.  No aneurysm. No hemodynamically significant stenosis in the neck. Irregularity and moderate stenosis of the proximal to mid right posterior communicating artery. Patchy ground-glass density in the included upper lobes. May reflect atelectasis, pneumonia, or edema. Enlarged, heterogeneous thyroid. Ultrasound is recommended as an outpatient if not previously performed. Initial emergent results were called by telephone at the time of interpretation on 05/31/2021 at 4:39 pm to provider Godfrey Pick , who verbally acknowledged  these results. Electronically Signed   By: Macy Mis M.D.   On: 05/31/2021 16:55     Assessment and Plan:   Elevated troponin sec to demand ischemia  ICH (large intracranial bleed) with midline shift and cerebral edema Acute respiratory failure- intubated H/o CAD s/p CABG Chronic comobridities: HTN- uncontrolled, CKD IV, h/o Hfpef,  Depression/schizophrenia, h/o dementia  Plan;- Unfortunately, the patient has had a significant large ICH- this is probably from uncontrolled HTN I suspect. Her troponin elevation is largely due to demand ischemia. She has CAD, prior CABG, however, given acute bleed- not a candidate for antiplatelet or anticoagulation therapy. Unfortunately, I have nothing to offer at this point. Her prognosis seems to be grim. Ok to continue to trend trops, get ECHO. Continue to treat her ICH per neurology/neuro ICU/[facility] team  Will sign off tomorrow. Do not hesitate to contact us if needed  Risk Assessment/Risk Scores:     TIMI Risk Score for Unstable Angina or Non-ST Elevation MI:   The patient's TIMI risk score is  , which indicates a  % risk of all cause mortality, new or recurrent myocardial infarction or need for urgent revascularization in the next 14 days.          For questions or updates, please contact [place] Please consult [email or web address] for contact info under    Signed, Renae Fickle, MD  05/31/2021 11:47 PM

## 2021-06-01 ENCOUNTER — Inpatient Hospital Stay (HOSPITAL_COMMUNITY): Payer: Medicaid Other

## 2021-06-01 DIAGNOSIS — J9600 Acute respiratory failure, unspecified whether with hypoxia or hypercapnia: Secondary | ICD-10-CM

## 2021-06-01 DIAGNOSIS — I610 Nontraumatic intracerebral hemorrhage in hemisphere, subcortical: Secondary | ICD-10-CM | POA: Diagnosis not present

## 2021-06-01 DIAGNOSIS — I214 Non-ST elevation (NSTEMI) myocardial infarction: Secondary | ICD-10-CM

## 2021-06-01 DIAGNOSIS — N179 Acute kidney failure, unspecified: Secondary | ICD-10-CM

## 2021-06-01 DIAGNOSIS — Z978 Presence of other specified devices: Secondary | ICD-10-CM | POA: Diagnosis not present

## 2021-06-01 DIAGNOSIS — J9601 Acute respiratory failure with hypoxia: Secondary | ICD-10-CM | POA: Diagnosis not present

## 2021-06-01 DIAGNOSIS — G936 Cerebral edema: Secondary | ICD-10-CM

## 2021-06-01 DIAGNOSIS — I615 Nontraumatic intracerebral hemorrhage, intraventricular: Secondary | ICD-10-CM

## 2021-06-01 DIAGNOSIS — I6389 Other cerebral infarction: Secondary | ICD-10-CM | POA: Diagnosis not present

## 2021-06-01 DIAGNOSIS — J9602 Acute respiratory failure with hypercapnia: Secondary | ICD-10-CM

## 2021-06-01 DIAGNOSIS — I609 Nontraumatic subarachnoid hemorrhage, unspecified: Secondary | ICD-10-CM

## 2021-06-01 LAB — ECHOCARDIOGRAM COMPLETE
AV Mean grad: 5 mmHg
AV Peak grad: 8.8 mmHg
Ao pk vel: 1.48 m/s
Area-P 1/2: 4.41 cm2
Height: 63 in
S' Lateral: 2.1 cm
Weight: 2469.15 oz

## 2021-06-01 LAB — URINALYSIS, ROUTINE W REFLEX MICROSCOPIC
Bacteria, UA: NONE SEEN
Bilirubin Urine: NEGATIVE
Glucose, UA: 150 mg/dL — AB
Ketones, ur: 5 mg/dL — AB
Leukocytes,Ua: NEGATIVE
Nitrite: NEGATIVE
Protein, ur: >=300 mg/dL — AB
Specific Gravity, Urine: 1.034 — ABNORMAL HIGH (ref 1.005–1.030)
pH: 5 (ref 5.0–8.0)

## 2021-06-01 LAB — LIPID PANEL
Cholesterol: 156 mg/dL (ref 0–200)
HDL: 33 mg/dL — ABNORMAL LOW (ref >40)
LDL Cholesterol: 87 mg/dL (ref 0–99)
Total CHOL/HDL Ratio: 4.7 RATIO
Triglycerides: 179 mg/dL — ABNORMAL HIGH (ref <150)
VLDL: 36 mg/dL (ref 0–40)

## 2021-06-01 LAB — HEMOGLOBIN A1C
Hgb A1c MFr Bld: 6 % — ABNORMAL HIGH (ref 4.8–5.6)
Mean Plasma Glucose: 125.5 mg/dL

## 2021-06-01 LAB — BASIC METABOLIC PANEL
Anion gap: 13 (ref 5–15)
BUN: 63 mg/dL — ABNORMAL HIGH (ref 8–23)
CO2: 14 mmol/L — ABNORMAL LOW (ref 22–32)
Calcium: 8.3 mg/dL — ABNORMAL LOW (ref 8.9–10.3)
Chloride: 126 mmol/L — ABNORMAL HIGH (ref 98–111)
Creatinine, Ser: 6.76 mg/dL — ABNORMAL HIGH (ref 0.44–1.00)
GFR, Estimated: 6 mL/min — ABNORMAL LOW (ref >60)
Glucose, Bld: 214 mg/dL — ABNORMAL HIGH (ref 70–99)
Potassium: 3.9 mmol/L (ref 3.5–5.1)
Sodium: 153 mmol/L — ABNORMAL HIGH (ref 135–145)

## 2021-06-01 LAB — CBC
HCT: 33.3 % — ABNORMAL LOW (ref 36.0–46.0)
Hemoglobin: 11.1 g/dL — ABNORMAL LOW (ref 12.0–15.0)
MCH: 29.4 pg (ref 26.0–34.0)
MCHC: 33.3 g/dL (ref 30.0–36.0)
MCV: 88.1 fL (ref 80.0–100.0)
Platelets: 141 10*3/uL — ABNORMAL LOW (ref 150–400)
RBC: 3.78 MIL/uL — ABNORMAL LOW (ref 3.87–5.11)
RDW: 14.7 % (ref 11.5–15.5)
WBC: 20.9 10*3/uL — ABNORMAL HIGH (ref 4.0–10.5)
nRBC: 0 % (ref 0.0–0.2)

## 2021-06-01 LAB — GLUCOSE, CAPILLARY
Glucose-Capillary: 180 mg/dL — ABNORMAL HIGH (ref 70–99)
Glucose-Capillary: 195 mg/dL — ABNORMAL HIGH (ref 70–99)
Glucose-Capillary: 199 mg/dL — ABNORMAL HIGH (ref 70–99)
Glucose-Capillary: 170 mg/dL — ABNORMAL HIGH (ref 70–99)
Glucose-Capillary: 146 mg/dL — ABNORMAL HIGH (ref 70–99)
Glucose-Capillary: 206 mg/dL — ABNORMAL HIGH (ref 70–99)
Glucose-Capillary: 186 mg/dL — ABNORMAL HIGH (ref 70–99)

## 2021-06-01 LAB — SODIUM
Sodium: 163 mmol/L (ref 135–145)
Sodium: 163 mmol/L (ref 135–145)

## 2021-06-01 LAB — TRIGLYCERIDES: Triglycerides: 184 mg/dL — ABNORMAL HIGH (ref <150)

## 2021-06-01 LAB — LACTIC ACID, PLASMA: Lactic Acid, Venous: 1.8 mmol/L (ref 0.5–1.9)

## 2021-06-01 IMAGING — MR MR HEAD W/O CM
6 of 10 series · 29 of 48 positions shown · non-contrast
Comparison: CT [DATE].

EXAM:
MRI HEAD WITHOUT CONTRAST
TECHNIQUE: Multiplanar, multiecho pulse sequences of the brain and surrounding
structures were obtained without intravenous contrast.

[Series 2: DWI · axial · 3.0mm · 0.94mm/px · z∈[-123,+21]mm · 8 of 99 slices shown (1 of 2)]
[im 1/99]
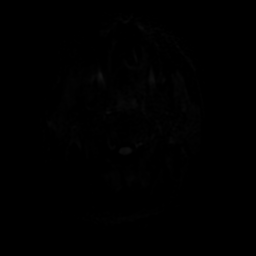
[im 11/99]
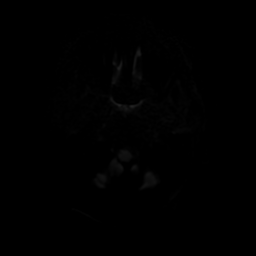
[im 33/99]
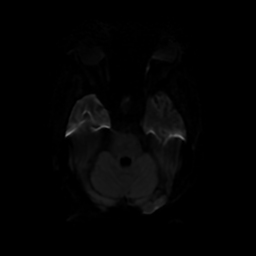
[im 44/99]
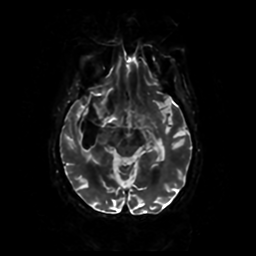
[im 55/99]
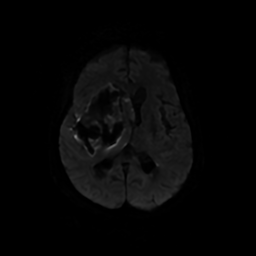
[im 66/99]
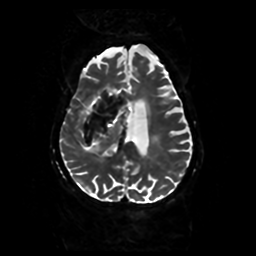
[im 88/99]
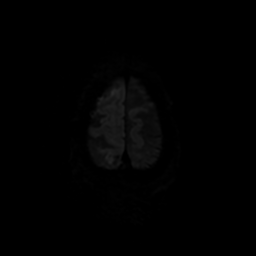
[im 99/99]
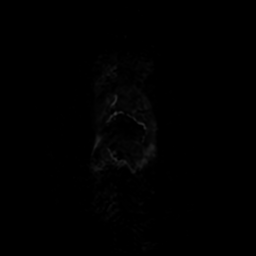

[Series 3: DWI · coronal · 4.0mm · 0.94mm/px · 7 of 74 slices shown (2 of 2)]
[im 1/74]
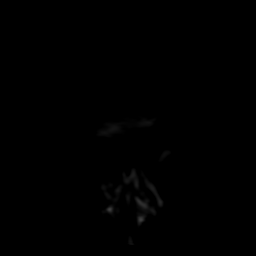
[im 13/74]
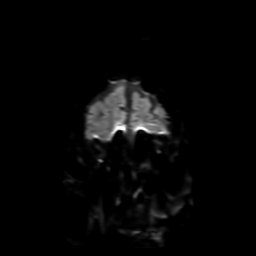
[im 25/74]
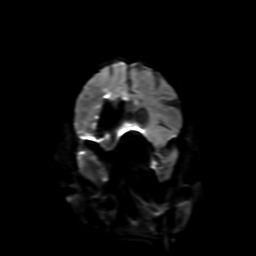
[im 37/74]
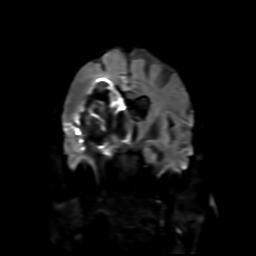
[im 49/74]
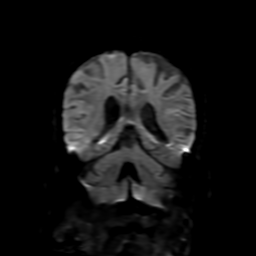
[im 61/74]
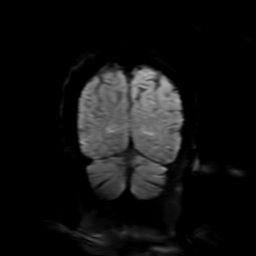
[im 74/74]
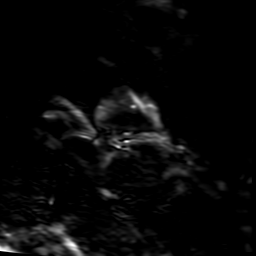

[Series 4: FLAIR · sagittal · 5.0mm · 0.23mm/px · 2 of 23 slices shown (1 of 2)]
[im 1/23]
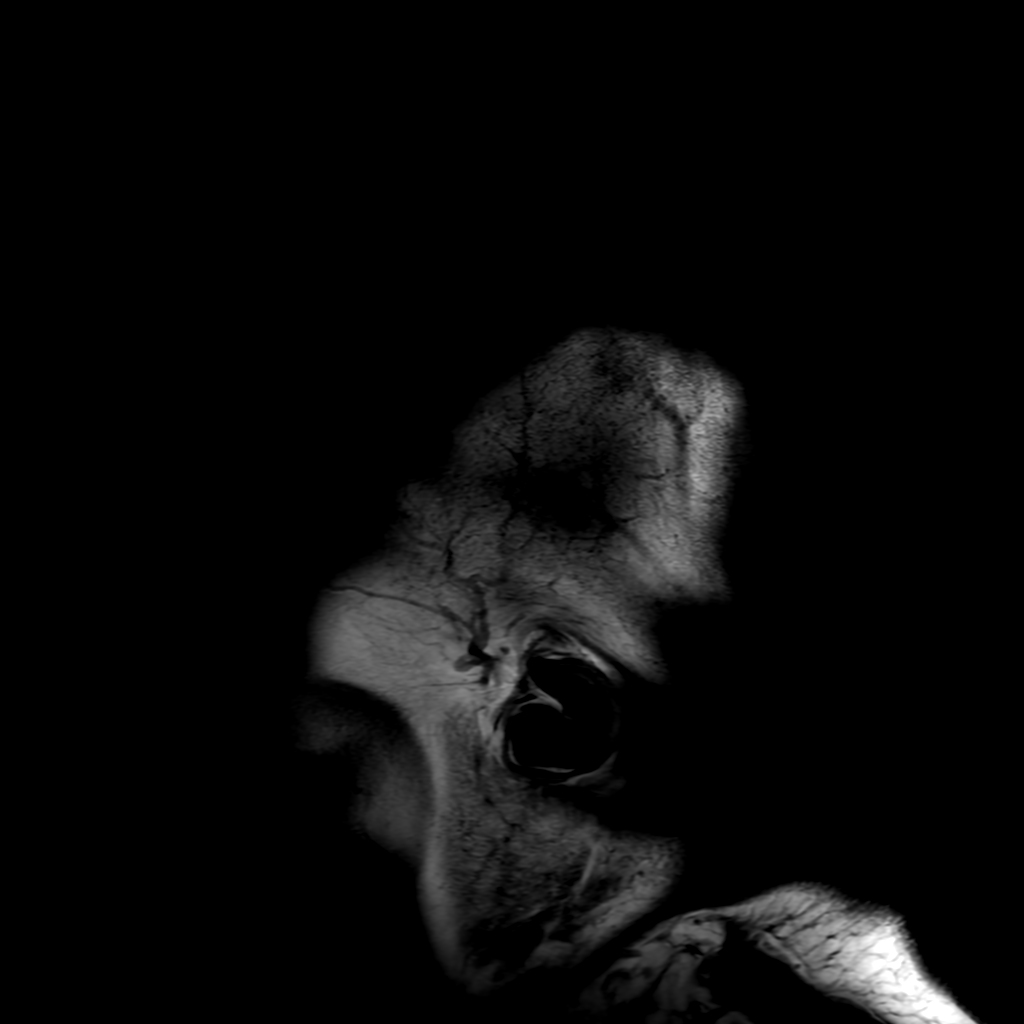
[im 23/23]
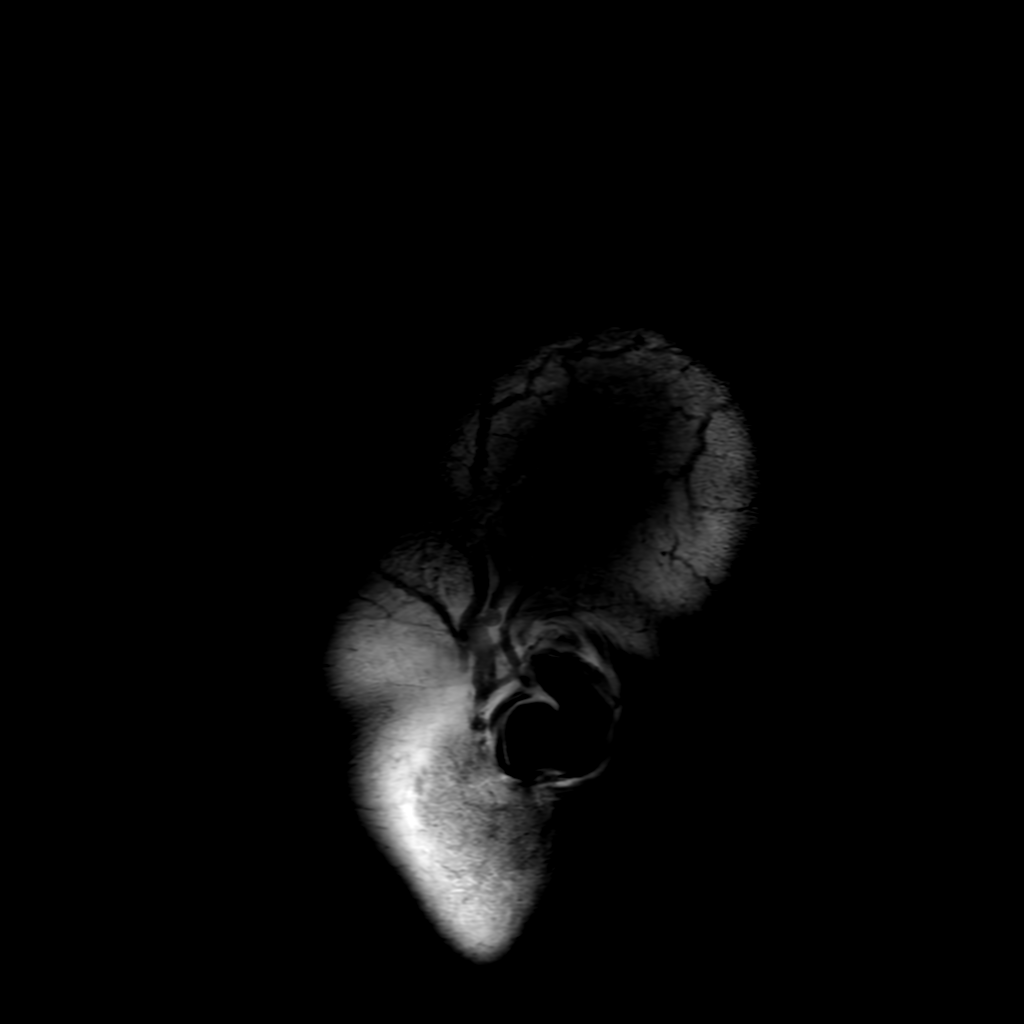

[Series 6: FLAIR · axial · 4.0mm · 0.45mm/px · z∈[-120,+21]mm · 3 of 34 slices shown (2 of 2)]
[im 1/34]
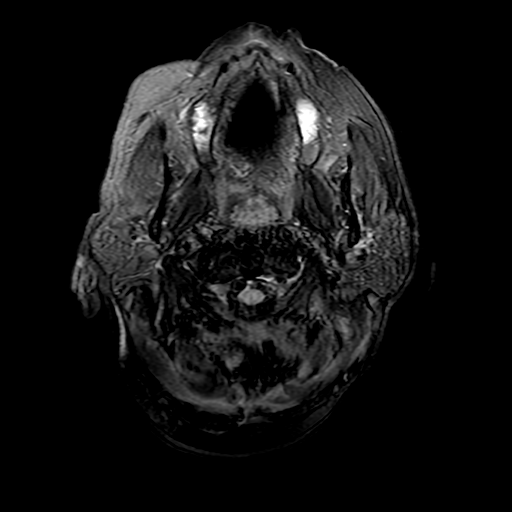
[im 17/34]
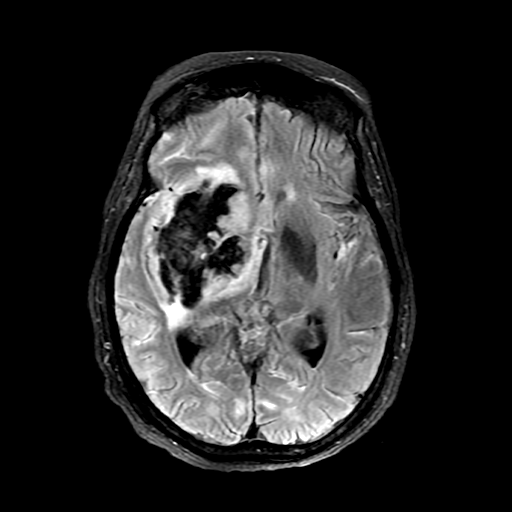
[im 34/34]
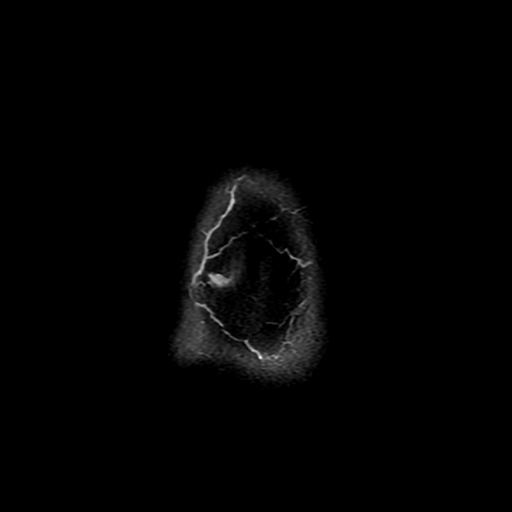

[Series 250: ADC · axial · 3.0mm · 0.94mm/px · z∈[-123,+21]mm · 5 of 50 slices shown (1 of 2)]
[im 1/50]
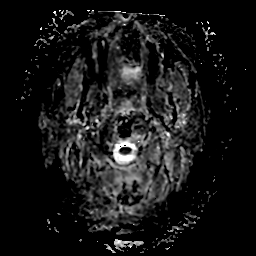
[im 13/50]
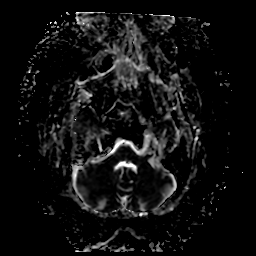
[im 25/50]
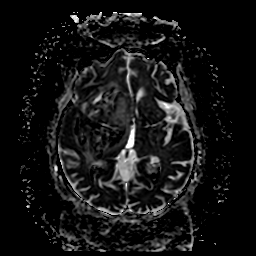
[im 37/50]
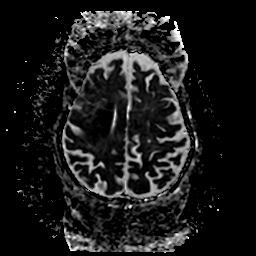
[im 50/50]
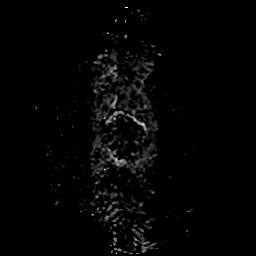

[Series 350: ADC · coronal · 4.0mm · 0.94mm/px · 4 of 37 slices shown (2 of 2)]
[im 1/37]
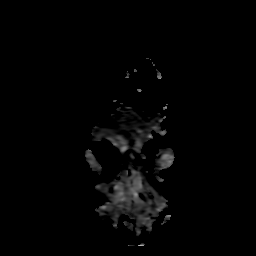
[im 13/37]
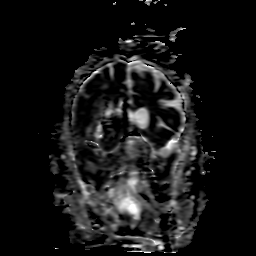
[im 25/37]
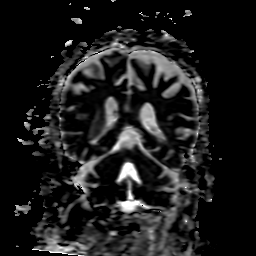
[im 37/37]
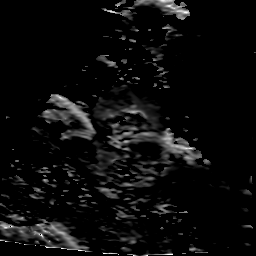

[29 of 48 positions shown; findings below may reference images not displayed]

FINDINGS: Brain: Very large intraparenchymal hematoma centered in the right
basal ganglia, likely similar in size when comparing across
modalities. The hemorrhage measures up to approximately 6.3 x 4.3 x
4.9 cm.Similar effacement of the right lateral ventricle with
approximately 8 mm of leftward midline shift. Similar moderate
volume of intraventricular hemorrhage and scattered subarachnoid
hemorrhage. No evidence of surrounding infarct. No definite
underlying mass; however, the hemorrhage is very heterogeneous and
acute blood products limits evaluation. Edema in the adjacent brain
parenchyma, including the basal ganglia and surrounding frontal,
parietal and temporal lobes. Similar ventricular size.

Vascular: Major arterial flow voids are maintained at the skull
base.

Skull and upper cervical spine: Normal marrow signal.

Sinuses/Orbits: Mild paranasal sinus mucosal thickening.

Other: No sizable mastoid effusions.
IMPRESSION: 1. Very large intraparenchymal hematoma centered in the right basal
ganglia, likely similar in size when comparing across modalities.
Similar effacement of the right lateral ventricle with approximately
8 mm of leftward midline shift. No evidence of surrounding infarct
to suggest hemorrhagic conversion of infarct. No definite underlying
mass; however, acute blood products limits evaluation. A follow up
mri with contrast may be useful to fully exclude underlying mass.
2. Similar moderate volume of intraventricular hemorrhage and
scattered subarachnoid hemorrhage. Similar ventricular size.

## 2021-06-01 MED ORDER — LABETALOL HCL 5 MG/ML IV SOLN
5.0000 mg | INTRAVENOUS | Status: DC | PRN
  Administered 2021-06-01 (×2): 10 mg via INTRAVENOUS
  Administered 2021-06-02 – 2021-06-03 (×5): 20 mg via INTRAVENOUS
  Administered 2021-06-04: 10 mg via INTRAVENOUS
  Administered 2021-06-05: 20 mg via INTRAVENOUS
  Administered 2021-06-05: 15 mg via INTRAVENOUS
  Administered 2021-06-05: 20 mg via INTRAVENOUS
  Administered 2021-06-06 (×2): 10 mg via INTRAVENOUS
  Administered 2021-06-06: 20 mg via INTRAVENOUS
  Administered 2021-06-06 – 2021-06-07 (×4): 10 mg via INTRAVENOUS
  Administered 2021-06-07 (×2): 20 mg via INTRAVENOUS
  Administered 2021-06-07: 10 mg via INTRAVENOUS
  Administered 2021-06-07 – 2021-06-08 (×6): 20 mg via INTRAVENOUS
  Filled 2021-06-01 (×25): qty 4

## 2021-06-01 MED ORDER — SENNOSIDES-DOCUSATE SODIUM 8.6-50 MG PO TABS
1.0000 | ORAL_TABLET | Freq: Two times a day (BID) | ORAL | Status: DC
  Administered 2021-06-01 – 2021-06-03 (×2): 1
  Filled 2021-06-01: qty 1

## 2021-06-01 MED ORDER — CARVEDILOL 12.5 MG PO TABS
12.5000 mg | ORAL_TABLET | Freq: Two times a day (BID) | ORAL | Status: DC
  Administered 2021-06-01 – 2021-06-05 (×9): 12.5 mg
  Filled 2021-06-01 (×9): qty 1

## 2021-06-01 MED ORDER — VITAL 1.5 CAL PO LIQD
1000.0000 mL | ORAL | Status: DC
  Administered 2021-06-02: 1000 mL
  Filled 2021-06-01: qty 1000

## 2021-06-01 MED ORDER — PROSOURCE TF PO LIQD
45.0000 mL | Freq: Every day | ORAL | Status: DC
  Administered 2021-06-01 – 2021-06-04 (×4): 45 mL
  Filled 2021-06-01 (×4): qty 45

## 2021-06-01 MED ORDER — VITAL HIGH PROTEIN PO LIQD
1000.0000 mL | ORAL | Status: AC
  Administered 2021-06-01: 1000 mL

## 2021-06-01 MED ORDER — FREE WATER
200.0000 mL | Status: DC
  Administered 2021-06-01 – 2021-06-03 (×11): 200 mL

## 2021-06-01 MED ORDER — AMLODIPINE BESYLATE 5 MG PO TABS
5.0000 mg | ORAL_TABLET | Freq: Every day | ORAL | Status: DC
  Administered 2021-06-01 – 2021-06-02 (×2): 5 mg
  Filled 2021-06-01 (×2): qty 1

## 2021-06-01 MED ORDER — CARVEDILOL 3.125 MG PO TABS
6.2500 mg | ORAL_TABLET | Freq: Two times a day (BID) | ORAL | Status: DC
  Filled 2021-06-01: qty 2

## 2021-06-01 MED ORDER — LEVETIRACETAM 100 MG/ML PO SOLN
250.0000 mg | Freq: Two times a day (BID) | ORAL | Status: DC
  Administered 2021-06-01 – 2021-06-06 (×10): 250 mg
  Filled 2021-06-01 (×10): qty 5

## 2021-06-01 MED ORDER — CHLORHEXIDINE GLUCONATE CLOTH 2 % EX PADS
6.0000 | MEDICATED_PAD | Freq: Every day | CUTANEOUS | Status: DC
  Administered 2021-06-02 – 2021-06-08 (×8): 6 via TOPICAL

## 2021-06-01 NOTE — Progress Notes (Signed)
  Echocardiogram 2D Echocardiogram has been performed.  Merrie Roof F 06/01/2021, 9:51 AM

## 2021-06-01 NOTE — Progress Notes (Signed)
PT Cancellation Note  Patient Details Name: Marcile Omura MRN: [id number] DOB: [birth year]   Cancelled Treatment:    Reason Eval/Treat Not Completed: Active bedrest order  Noelie Renfrow A. Gilford Rile PT, DPT Acute Rehabilitation Services Pager [phone] Office [phone]    Linna Hoff 06/01/2021, 7:48 AM

## 2021-06-01 NOTE — Progress Notes (Signed)
STROKE TEAM PROGRESS NOTE   INTERVAL HISTORY Daughter at bedside.  Patient still intubated on ventilation, attempted open eyes on voice but not able to open eyes, however follow simple commands on the right hand and foot.  Discussed about daughter extensively regarding potential cerebral edema and brain herniation and worsening mental status.  Also discussed with daughter regarding surgical intervention for which patient is not a good candidate.  Daughter agrees no surgical intervention at this time.  Daughter shared with me that patient was severely depressed before current [place] and she was not sure about patient wishes at this time.  Vitals:   06/01/21 0645 06/01/21 0700 06/01/21 0800 06/01/21 0839  BP: 131/70 129/69 127/69   Pulse: 86 87 91 90  Resp: (!) 26 (!) 28 (!) 28 (!) 32  Temp:   98.8 F (37.1 C)   TempSrc:   Axillary   SpO2: 96% 95% 95% 96%  Weight:      Height:       CBC:  Recent Labs  Lab 05/31/21 1600 05/31/21 1609 05/31/21 1819 06/01/21 0448  WBC 14.9*  --   --  20.9*  NEUTROABS 12.6*  --   --   --   HGB 13.6   < > 11.2* 11.1*  HCT 40.9   < > 33.0* 33.3*  MCV 88.0  --   --  88.1  PLT 176  --   --  141*   < > = values in this interval not displayed.   Basic Metabolic Panel:  Recent Labs  Lab 05/31/21 1600 05/31/21 1609 05/31/21 1819 05/31/21 1902 05/31/21 2101 06/01/21 0448  NA 142 144 149* 143 150* 153*  K 3.8 3.9 3.5  --   --  3.9  CL 112* 115*  --   --   --  126*  CO2 14*  --   --   --   --  14*  GLUCOSE 193* 188*  --   --   --  214*  BUN 59* 56*  --   --   --  63*  CREATININE 6.65* 6.90*  --   --   --  6.76*  CALCIUM 9.1  --   --   --   --  8.3*  MG  --   --   --  2.2  --   --   PHOS  --   --   --  5.3*  --   --    Lipid Panel:  Recent Labs  Lab 06/01/21 0448  TRIG 184*   HgbA1c: No results for input(s): HGBA1C in the last 168 hours. Urine Drug Screen: No results for input(s): LABOPIA, COCAINSCRNUR, LABBENZ, AMPHETMU, THCU, LABBARB in the  last 168 hours.  Alcohol Level  Recent Labs  Lab 05/31/21 1902  ETH <10    IMAGING past 24 hours CT HEAD WO CONTRAST  Result Date: 05/31/2021 CLINICAL DATA:  Parenchymal hemorrhage follow-up EXAM: CT HEAD WITHOUT CONTRAST TECHNIQUE: Contiguous axial images were obtained from the base of the skull through the vertex without intravenous contrast. COMPARISON:  05/31/2021 at 4:24 p.m. FINDINGS: Brain: Unchanged appearance of massive intraparenchymal hematoma centered in the right basal ganglia with leftward midline shift of approximately 8 mm. There is subarachnoid blood again seen over both hemispheres. The size and configuration of the ventricles are unchanged. Vascular: No abnormal hyperdensity of the major intracranial arteries or dural venous sinuses. No intracranial atherosclerosis. Skull: The visualized skull base, calvarium and extracranial soft tissues are normal.  Sinuses/Orbits: No fluid levels or advanced mucosal thickening of the visualized paranasal sinuses. No mastoid or middle ear effusion. The orbits are normal. IMPRESSION: 1. Unchanged appearance of massive intraparenchymal hematoma centered in the right basal ganglia with leftward midline shift of approximately 8 mm. 2. Unchanged subarachnoid blood over both hemispheres. Electronically Signed   By: Ulyses Jarred M.D.   On: 05/31/2021 22:24   DG CHEST PORT 1 VIEW  Result Date: 05/31/2021 CLINICAL DATA:  Check gastric catheter placement EXAM: PORTABLE CHEST 1 VIEW COMPARISON:  Film from earlier in the same day. FINDINGS: Endotracheal tube and gastric catheter are again noted in satisfactory position. Cardiac shadow is stable. Postsurgical changes are again seen. Lungs are clear. No bony abnormality is noted. IMPRESSION: Tubes and lines as described above.  No acute abnormality noted. Electronically Signed   By: Inez Catalina M.D.   On: 05/31/2021 19:42   DG Chest Portable 1 View  Result Date: 05/31/2021 CLINICAL DATA:  Status post  intubation. EXAM: PORTABLE CHEST 1 VIEW COMPARISON:  August 23, 2020 FINDINGS: An endotracheal tube is seen with its distal tip approximately 2.1 cm from the carina. A nasogastric tube is noted with its distal end within the body of the stomach. Multiple sternal wires and vascular clips are seen. The heart size and mediastinal contours are within normal limits. Both lungs are clear. The visualized skeletal structures are unremarkable. IMPRESSION: 1. Endotracheal tube positioning, as described above. 2. No acute or active cardiopulmonary disease. Electronically Signed   By: Virgina Norfolk M.D.   On: 05/31/2021 16:35   CT ANGIO HEAD NECK W WO CM (CODE STROKE)  Result Date: 05/31/2021 CLINICAL DATA:  Neuro deficit, acute, stroke suspected EXAM: CT ANGIOGRAPHY HEAD AND NECK TECHNIQUE: Multidetector CT imaging of the head and neck was performed using the standard protocol during bolus administration of intravenous contrast. Multiplanar CT image reconstructions and MIPs were obtained to evaluate the vascular anatomy. Carotid stenosis measurements (when applicable) are obtained utilizing NASCET criteria, using the distal internal carotid diameter as the denominator. CONTRAST:  53m OMNIPAQUE IOHEXOL 350 MG/ML SOLN COMPARISON:  None. FINDINGS: CT HEAD Brain: There is a large area of hemorrhage within the right cerebral hemisphere centered within the basal ganglia and surrounding white matter. Approximate measurements of 5.8 x 4.5 x 5.6 cm. There is adjacent sulcal subarachnoid hemorrhage with involvement of sylvian fissure and right MCA cistern. Some contralateral sulcal subarachnoid hemorrhage is present and could be post-traumatic or reflect recirculation. Intraventricular extension is present. There is surrounding edema with regional mass effect. Leftward midline shift is present measuring approximately 8 mm. There is no ventricle trapping at this time. No significant central herniation. Vascular: Better  evaluated on CTA portion. Skull: Calvarium is unremarkable. Sinuses/Orbits: No acute finding. Other: None. Review of the MIP images confirms the above findings CTA NECK Aortic arch: Great vessel origins are patent. There is mixed plaque along the proximal left greater than right subclavian arteries without significant stenosis. Right carotid system: Patent. Mild primarily calcified plaque along the proximal internal carotid with minimal stenosis. Left carotid system: Patent. Trace calcified plaque along the proximal internal carotid without stenosis. Vertebral arteries: Patent.  Codominant.  No stenosis. Skeleton: No significant abnormality. Other neck: Enlarged, heterogeneous thyroid. Endotracheal and enteric tubes are present. Upper chest: Patchy ground-glass density in the included upper lobes. Review of the MIP images confirms the above findings CTA HEAD Anterior circulation: Intracranial internal carotid arteries are patent with calcified plaque causing mild stenosis, greater on the  right. Anterior and middle cerebral arteries are patent. No aneurysm identified. Posterior circulation: Intracranial vertebral arteries are patent. Basilar artery is patent. Major cerebellar artery origins are patent. Bilateral posterior communicating arteries are present. There is irregularity and moderate stenosis of the proximal to mid right PCOM. Posterior cerebral arteries are patent. No aneurysm identified. Venous sinuses: Patent as allowed by contrast bolus timing. Review of the MIP images confirms the above findings IMPRESSION: Large area of parenchymal hemorrhage centered within the right basal ganglia region and surrounding white matter. Subarachnoid extension is present into adjacent sulci and ventricular system. Small volume contralateral subarachnoid hemorrhage likely reflects recirculation an absence of trauma. Mass effect including 8 mm leftward midline shift. No ventricle entrapment at this time. No abnormal  vascularity in the area of hemorrhage.  No aneurysm. No hemodynamically significant stenosis in the neck. Irregularity and moderate stenosis of the proximal to mid right posterior communicating artery. Patchy ground-glass density in the included upper lobes. May reflect atelectasis, pneumonia, or edema. Enlarged, heterogeneous thyroid. Ultrasound is recommended as an outpatient if not previously performed. Initial emergent results were called by telephone at the time of interpretation on 05/31/2021 at 4:39 pm to provider Godfrey Pick , who verbally acknowledged these results. Electronically Signed   By: Macy Mis M.D.   On: 05/31/2021 16:55    PHYSICAL EXAM  Temp:  [98 F (36.7 C)-99.8 F (37.7 C)] 99.8 F (37.7 C) (10/15 1200) Pulse Rate:  [81-100] 88 (10/15 1200) Resp:  [12-32] 23 (10/15 1200) BP: (114-187)/(65-124) 142/75 (10/15 1200) SpO2:  [86 %-100 %] 99 % (10/15 1200) FiO2 (%):  [40 %-50 %] 40 % (10/15 0839) Weight:  [70 kg] 70 kg (10/14 1600)  General - Well nourished, well developed, intubated off sedation.  Ophthalmologic - fundi not visualized due to noncooperation.  Cardiovascular - Regular rate and rhythm.  Neuro - intubated not on sedation, eyes closed, attempted to open on voice but not able to open, however, following simple commands on the right hand and foot. With forced eye opening, eyes in right gaze position, not cross midline, blinking to visual threat inconsistently on the right but not on the left, not tracking, pupils 2.56m, sluggish to light. Corneal reflex present, gag and cough present. Breathing over the vent.  Facial symmetry not able to test due to ET tube.  Tongue protrusion not cooperative. On pain stimulation, no movement of left UE and LE. RUE proximal 3-/5 and finger grip 2+/5. RLE withdraw to pain but not able to against gravity, toe DF/PF 2+/5. DTR 1+ and no babinski. Sensation, coordination and gait not tested.    ASSESSMENT/PLAN Christina Nunez  a 65yo female with a PMHx of mild dementia, CKD IV, pre DM, HLD, ischemic stroke, HTN, and depression admitted for unresponsiveness. Patient had seizure activity en route and was given Versed 2.'5mg'$  per EMS.   ICH - right BG large ICH with IVH and SAH, likely due to hypertensive emergency CSabula- large area of parenchymal hemorrhage centered within the right basal ganglia region. Subarachnoid extention also present with intraventricular extension (ICH 4). 82mleftward midline shift.  CTA head & neck No AVM or aneurysm CT repeat stable hematoma, midline shift, no hydrocephalus MRI brain stable hematoma, SAH, IVH and midline shift 8 mm 2D Echo EF 60 to 65% LDL 128 HgbA1c 6.3 VTE prophylaxis - SCDs aspirin 81 mg daily and clopidogrel 75 mg daily prior to admission, now on No antithrombotic given ICH Therapy recommendations: Pending Disposition:  Pending  Cerebral edema CT and MRI showed stable midline shift 8 mm On 3% saline @ 75 -> FW 200 Q4h Na 142-149-153-163-163 Na monitoring Urine osmolarity pending  Respiratory failure Intubated on ventilation CCM on board Not candidate for extubation  AKI on CKD  Cre 6.65->6.90->6.76 Baseline around 4 in 08/2020 Per CCM, patient deemed not a candidate for dialysis Gentle hydration CCM on board  Seizure Witnessed seizure with EMS Stat post Versed 2.5 mg Currently no seizure activity, follow commands on the right side EEG pending Keppra 250 twice daily per renal adjustment  History of stroke 07/2020 admitted for right CR/SO lacunar infarct on MRI.  CT no acute abnormality.  MRI also showed old left cerebellum, left BG and thalamic, bilateral SO and CR infarcts.  MRI head and neck bilateral siphon moderate atherosclerosis, left more than right.  DVT negative.  EF 50 to 55%.  LDL 128, A1c 6.3.  Patient discharged with DAPT and Lipitor 40.  Hypertensive emergency Home meds: Lasix, Coreg 6.25 Stable, off Cleviprex On amlodipine 5 and Coreg  12.5 Long-term BP goal normotensive  Hyperlipidemia Home meds: Lipitor 40,  LDL 128, goal < 70 Consider statin at discharge  Dysphagia On tube feeding @ 50 On FW 200 Q4  Other Stroke Risk Factors CAD status post CABG  Other Active Problems Leukocytosis WBC 20.9 Anemia of chronic disease, hemoglobin 11.1  Hospital day # 1  This patient is critically ill due to large right BG ICH, SAH, IVH, cerebral edema, seizure, AKI on CKD and at significant risk of neurological worsening, death form brain herniation, renal failure, status epilepticus, hydrocephalus. This patient's care requires constant monitoring of vital signs, hemodynamics, respiratory and cardiac monitoring, review of multiple databases, neurological assessment, discussion with family, other specialists and medical decision making of high complexity. I spent 45 minutes of neurocritical care time in the care of this patient. I had long discussion with daughter at bedside, updated pt current condition, treatment plan and potential prognosis, and answered all the questions.  She expressed understanding and appreciation.  I also discussed with CCM Dr. Elsworth Soho.   Rosalin Hawking, MD PhD Stroke Neurology 06/01/2021 11:58 PM   To contact Stroke Continuity provider, please refer to [email or web address] After hours, contact General Neurology

## 2021-06-01 NOTE — Progress Notes (Signed)
Initial Nutrition Assessment  DOCUMENTATION CODES:   Not applicable  INTERVENTION:  Continue TF via OGT: -Transition to Vital 1.5 @ 53m/hr -Advance TF by 131mhr Q4H until goal rate of 5028mr (1200m65m is reached -45ml27msource TF daily  At goal, TF provides 1840 kcals, 92 grams protein, 916ml 65m water   NUTRITION DIAGNOSIS:   Inadequate oral intake related to inability to eat as evidenced by NPO status.  GOAL:   Patient will meet greater than or equal to 90% of their needs  MONITOR:   TF tolerance, Vent status, Weight trends, Labs, I & O's  REASON FOR ASSESSMENT:   Consult, Ventilator Enteral/tube feeding initiation and management  ASSESSMENT:   Pt with a PMH significant for ischemic stroke, HTN, HLD, CKD IV, mild dementia, pre-DM admitted with large R intracerebral hemorrhage in R basal ganglia w/ 8cm leftward shift, cerebral edema, and hypertensive emergency.  10/14 - admit; intubated  Per H&P, pt's last known normal was 0750 on 10/14. Pt's HH RN was unable to get into the house and when family arrived pt was found unresponsive next to bed. EMS noted pt w/ seizure-like activity en route to [facility].  [facility]/weight history at this time. However, per available weight readings, pt weighed 77.3 kg on 08/24/20 and 70 kg upon admit. This indicates a 9.4% weight loss x9 months, which appears clinically insignificant, but unable to truly quantify significance of weight loss given limited information. Possible that pt's weight loss occurred within smaller timeframe which could be clinically significant. Will attempt to clarify at follow-up.  Per CCM, pt afebrile with no further seizure activity. Pt deemed not a candidate for dialysis.   Patient is currently intubated on ventilator support MV: 8.5 L/min Temp (24hrs), Avg:98.8 F (37.1 C), Min:98 F (36.7 C), Max:99.8 F (37.7 C)  Per CCM, can start spontaneous breathing trials but will hold off on extubation  for 48 hours until certain that cerebral edema is not going to get worse.   Pt initiated on trickle tube feeding via OGT  per Adult Tube Feeding protocol (tube tip in stomach per xray). Current TF orders: Vital High Protein @ 20ml/h17mrovides 480 kcals and 41 grams protein)  UOP: 310ml x246murs I/O: +2406ml [id number]  Medications: SSI Q4H, colace, IV protonix, miralax, senokot-s IVF: hypertonic 3% NaCl @ 75ml/hr L78m Na 153 (H), BUN 63 (H), 6.76 (H) CBGs: 180-195-199-170  NUTRITION - FOCUSED PHYSICAL EXAM: Unable to complete at this time; will attempt at follow-up  Diet Order:   Diet Order             Diet NPO time specified  Diet effective now                   EDUCATION NEEDS:   Not appropriate for education at this time  Skin:  Skin Assessment: Reviewed RN Assessment  Last BM:  PTA  Height:   Ht Readings from Last 1 Encounters:  05/31/21 '5\' 3"'$  (1.6 m)    Weight:   Wt Readings from Last 2 Encounters:  05/31/21 70 kg  08/24/20 77.3 kg   BMI:  Body mass index is 27.34 kg/m.  Estimated Nutritional Needs:  Kcal:  1750-1950 [id number] 85-100 grams Fluid:  >1.8L/day    Dalis Beers AveLarkin InaLDN (she/her/hers) RD pager number and weekend/on-call pager number located in [email or web address]

## 2021-06-01 NOTE — Progress Notes (Signed)
OT Cancellation Note  Patient Details Name: [name] MRN: [id number] DOB: 06-17-1956   Cancelled Treatment:    Reason Eval/Treat Not Completed: Active bedrest order. Will follow and see as able.   Jolaine Artist, OT Acute Rehabilitation Services Pager [phone] Office [phone]   Delight Stare 06/01/2021, 8:04 AM

## 2021-06-01 NOTE — Progress Notes (Signed)
Cardiology Progress Note  Patient ID: Christina Nunez MRN: [id number] DOB: Nov 09, 1955 Date of Encounter: 06/01/2021  Primary Cardiologist: None  Subjective   Chief Complaint: None.   HPI: Admitted with large intraparenchymal hemorrhage.  CKD stage V.  She has had CABG in the past.  Discussed this with her sister.  Troponin elevation but echo shows preserved EF.  EKG with diffuse T wave inversions.  Not a candidate for invasive angiography or medical therapy for non-STEMI.  Poor prognosis per neurology.  ROS:  All other ROS reviewed and negative. Pertinent positives noted in the HPI.     Inpatient Medications  Scheduled Meds:   stroke: mapping our early stages of recovery book   Does not apply Once   amLODipine  5 mg Per Tube Daily   carvedilol  6.25 mg Per Tube BID WC   chlorhexidine gluconate (MEDLINE KIT)  15 mL Mouth Rinse BID   Chlorhexidine Gluconate Cloth  6 each Topical Daily   docusate  100 mg Per Tube BID   feeding supplement (VITAL HIGH PROTEIN)  1,000 mL Per Tube Q24H   insulin aspart  0-6 Units Subcutaneous Q4H   mouth rinse  15 mL Mouth Rinse 10 times per day   pantoprazole (PROTONIX) IV  40 mg Intravenous QHS   polyethylene glycol  17 g Per Tube Daily   senna-docusate  1 tablet Per Tube BID   Continuous Infusions:  clevidipine 6 mg/hr (06/01/21 1100)   sodium chloride (hypertonic) 75 mL/hr at 06/01/21 1100   PRN Meds: acetaminophen **OR** acetaminophen (TYLENOL) oral liquid 160 mg/5 mL **OR** acetaminophen, etomidate, fentaNYL (SUBLIMAZE) injection, fentaNYL (SUBLIMAZE) injection, rocuronium   Vital Signs   Vitals:   06/01/21 1015 06/01/21 1030 06/01/21 1045 06/01/21 1100  BP: 136/79 122/77 129/76 130/76  Pulse: 91 92 90 91  Resp: (!) 25 (!) 25 (!) 23 (!) 23  Temp:      TempSrc:      SpO2: 97% 97% 97% 98%  Weight:      Height:        Intake/Output Summary (Last 24 hours) at 06/01/2021 1107 Last data filed at 06/01/2021 1100 Gross per 24 hour  Intake  2538.7 ml  Output 385 ml  Net 2153.7 ml   Last 3 Weights 05/31/2021 08/24/2020 08/24/2020  Weight (lbs) 154 lb 5.2 oz 170 lb 6.7 oz 173 lb 11.6 oz  Weight (kg) 70 kg 77.3 kg 78.8 kg      Telemetry  Overnight telemetry shows sinus rhythm in the 80s, which I personally reviewed.   ECG  The most recent ECG shows sinus tachycardia heart rate 100, anterolateral T wave inversion, which I personally reviewed.   Physical Exam   Vitals:   06/01/21 1015 06/01/21 1030 06/01/21 1045 06/01/21 1100  BP: 136/79 122/77 129/76 130/76  Pulse: 91 92 90 91  Resp: (!) 25 (!) 25 (!) 23 (!) 23  Temp:      TempSrc:      SpO2: 97% 97% 97% 98%  Weight:      Height:        Intake/Output Summary (Last 24 hours) at 06/01/2021 1107 Last data filed at 06/01/2021 1100 Gross per 24 hour  Intake 2538.7 ml  Output 385 ml  Net 2153.7 ml    Last 3 Weights 05/31/2021 08/24/2020 08/24/2020  Weight (lbs) 154 lb 5.2 oz 170 lb 6.7 oz 173 lb 11.6 oz  Weight (kg) 70 kg 77.3 kg 78.8 kg    Body mass index is  27.34 kg/m.  General: Ill-appearing on the ventilator Head: Atraumatic, normal size  Eyes: No scleral icterus Neck: Supple, no JVD Endocrine: No thryomegaly Cardiac: Normal S1, S2; RRR; no murmurs, rubs, or gallops Lungs: Diminished breath sounds bilaterally Abd: Soft, nontender, no hepatomegaly  Ext: No edema, pulses 2+ Musculoskeletal: No deformities Skin: Warm and dry, no rashes   Neuro: Intubated on the vent  Labs  High Sensitivity Troponin:   Recent Labs  Lab 05/31/21 1902  TROPONINIHS 18,301*     Cardiac EnzymesNo results for input(s): TROPONINI in the last 168 hours. No results for input(s): TROPIPOC in the last 168 hours.  Chemistry Recent Labs  Lab 05/31/21 1600 05/31/21 1609 05/31/21 1819 05/31/21 1902 05/31/21 2101 06/01/21 0448  NA 142 144 149* 143 150* 153*  K 3.8 3.9 3.5  --   --  3.9  CL 112* 115*  --   --   --  126*  CO2 14*  --   --   --   --  14*  GLUCOSE 193* 188*  --   --    --  214*  BUN 59* 56*  --   --   --  63*  CREATININE 6.65* 6.90*  --   --   --  6.76*  CALCIUM 9.1  --   --   --   --  8.3*  PROT 6.8  --   --   --   --   --   ALBUMIN 3.2*  --   --   --   --   --   AST 61*  --   --   --   --   --   ALT 25  --   --   --   --   --   ALKPHOS 68  --   --   --   --   --   BILITOT 0.8  --   --   --   --   --   GFRNONAA 6*  --   --   --   --  6*  ANIONGAP 16*  --   --   --   --  13    Hematology Recent Labs  Lab 05/31/21 1600 05/31/21 1609 05/31/21 1819 06/01/21 0448  WBC 14.9*  --   --  20.9*  RBC 4.65  --   --  3.78*  HGB 13.6 13.6 11.2* 11.1*  HCT 40.9 40.0 33.0* 33.3*  MCV 88.0  --   --  88.1  MCH 29.2  --   --  29.4  MCHC 33.3  --   --  33.3  RDW 14.4  --   --  14.7  PLT 176  --   --  141*   BNPNo results for input(s): BNP, PROBNP in the last 168 hours.  DDimer No results for input(s): DDIMER in the last 168 hours.   Radiology  CT HEAD WO CONTRAST  Result Date: 05/31/2021 CLINICAL DATA:  Parenchymal hemorrhage follow-up EXAM: CT HEAD WITHOUT CONTRAST TECHNIQUE: Contiguous axial images were obtained from the base of the skull through the vertex without intravenous contrast. COMPARISON:  05/31/2021 at 4:24 p.m. FINDINGS: Brain: Unchanged appearance of massive intraparenchymal hematoma centered in the right basal ganglia with leftward midline shift of approximately 8 mm. There is subarachnoid blood again seen over both hemispheres. The size and configuration of the ventricles are unchanged. Vascular: No abnormal hyperdensity of the major intracranial arteries or dural venous sinuses. No intracranial  atherosclerosis. Skull: The visualized skull base, calvarium and extracranial soft tissues are normal. Sinuses/Orbits: No fluid levels or advanced mucosal thickening of the visualized paranasal sinuses. No mastoid or middle ear effusion. The orbits are normal. IMPRESSION: 1. Unchanged appearance of massive intraparenchymal hematoma centered in the right  basal ganglia with leftward midline shift of approximately 8 mm. 2. Unchanged subarachnoid blood over both hemispheres. Electronically Signed   By: Ulyses Jarred M.D.   On: 05/31/2021 22:24   DG CHEST PORT 1 VIEW  Result Date: 05/31/2021 CLINICAL DATA:  Check gastric catheter placement EXAM: PORTABLE CHEST 1 VIEW COMPARISON:  Film from earlier in the same day. FINDINGS: Endotracheal tube and gastric catheter are again noted in satisfactory position. Cardiac shadow is stable. Postsurgical changes are again seen. Lungs are clear. No bony abnormality is noted. IMPRESSION: Tubes and lines as described above.  No acute abnormality noted. Electronically Signed   By: Inez Catalina M.D.   On: 05/31/2021 19:42   DG Chest Portable 1 View  Result Date: 05/31/2021 CLINICAL DATA:  Status post intubation. EXAM: PORTABLE CHEST 1 VIEW COMPARISON:  August 23, 2020 FINDINGS: An endotracheal tube is seen with its distal tip approximately 2.1 cm from the carina. A nasogastric tube is noted with its distal end within the body of the stomach. Multiple sternal wires and vascular clips are seen. The heart size and mediastinal contours are within normal limits. Both lungs are clear. The visualized skeletal structures are unremarkable. IMPRESSION: 1. Endotracheal tube positioning, as described above. 2. No acute or active cardiopulmonary disease. Electronically Signed   By: Virgina Norfolk M.D.   On: 05/31/2021 16:35   CT ANGIO HEAD NECK W WO CM (CODE STROKE)  Result Date: 05/31/2021 CLINICAL DATA:  Neuro deficit, acute, stroke suspected EXAM: CT ANGIOGRAPHY HEAD AND NECK TECHNIQUE: Multidetector CT imaging of the head and neck was performed using the standard protocol during bolus administration of intravenous contrast. Multiplanar CT image reconstructions and MIPs were obtained to evaluate the vascular anatomy. Carotid stenosis measurements (when applicable) are obtained utilizing NASCET criteria, using the distal internal  carotid diameter as the denominator. CONTRAST:  20m OMNIPAQUE IOHEXOL 350 MG/ML SOLN COMPARISON:  None. FINDINGS: CT HEAD Brain: There is a large area of hemorrhage within the right cerebral hemisphere centered within the basal ganglia and surrounding white matter. Approximate measurements of 5.8 x 4.5 x 5.6 cm. There is adjacent sulcal subarachnoid hemorrhage with involvement of sylvian fissure and right MCA cistern. Some contralateral sulcal subarachnoid hemorrhage is present and could be post-traumatic or reflect recirculation. Intraventricular extension is present. There is surrounding edema with regional mass effect. Leftward midline shift is present measuring approximately 8 mm. There is no ventricle trapping at this time. No significant central herniation. Vascular: Better evaluated on CTA portion. Skull: Calvarium is unremarkable. Sinuses/Orbits: No acute finding. Other: None. Review of the MIP images confirms the above findings CTA NECK Aortic arch: Great vessel origins are patent. There is mixed plaque along the proximal left greater than right subclavian arteries without significant stenosis. Right carotid system: Patent. Mild primarily calcified plaque along the proximal internal carotid with minimal stenosis. Left carotid system: Patent. Trace calcified plaque along the proximal internal carotid without stenosis. Vertebral arteries: Patent.  Codominant.  No stenosis. Skeleton: No significant abnormality. Other neck: Enlarged, heterogeneous thyroid. Endotracheal and enteric tubes are present. Upper chest: Patchy ground-glass density in the included upper lobes. Review of the MIP images confirms the above findings CTA HEAD Anterior circulation: Intracranial internal  carotid arteries are patent with calcified plaque causing mild stenosis, greater on the right. Anterior and middle cerebral arteries are patent. No aneurysm identified. Posterior circulation: Intracranial vertebral arteries are patent.  Basilar artery is patent. Major cerebellar artery origins are patent. Bilateral posterior communicating arteries are present. There is irregularity and moderate stenosis of the proximal to mid right PCOM. Posterior cerebral arteries are patent. No aneurysm identified. Venous sinuses: Patent as allowed by contrast bolus timing. Review of the MIP images confirms the above findings IMPRESSION: Large area of parenchymal hemorrhage centered within the right basal ganglia region and surrounding white matter. Subarachnoid extension is present into adjacent sulci and ventricular system. Small volume contralateral subarachnoid hemorrhage likely reflects recirculation an absence of trauma. Mass effect including 8 mm leftward midline shift. No ventricle entrapment at this time. No abnormal vascularity in the area of hemorrhage.  No aneurysm. No hemodynamically significant stenosis in the neck. Irregularity and moderate stenosis of the proximal to mid right posterior communicating artery. Patchy ground-glass density in the included upper lobes. May reflect atelectasis, pneumonia, or edema. Enlarged, heterogeneous thyroid. Ultrasound is recommended as an outpatient if not previously performed. Initial emergent results were called by telephone at the time of interpretation on 05/31/2021 at 4:39 pm to provider Godfrey Pick , who verbally acknowledged these results. Electronically Signed   By: Macy Mis M.D.   On: 05/31/2021 16:55    Cardiac Studies  Echo my read: EF 60-65%, no WMA, postop septal movement, heavy trabeculation in the left ventricular apex  Patient Profile  Monasia Lair is a 65 y.o. female with CKD 5 (not a candidate for dialysis or transplant per sister), mild dementia, CAD status post CABG, hypertension, depression who was admitted on 05/31/2021 with acute intraparenchymal hemorrhage with significant midline shift.  Cardiology consulted for elevated troponin.  Assessment & Plan   #Non-STEMI #CKD  V #HTN #Massive intraparenchymal hemorrhage with midline shift -History of CAD status post CABG.  This was in [place].  We do not have records.  She has not followed with a cardiologist in several years.  She apparently has had issues with being stable and recently moved in with her sister in the [place] area 1 year ago. -Now here with massive intraparenchymal hemorrhage with midline shift.  She has baseline CKD stage V and has not been deemed a good candidate for outpatient dialysis.  This is likely all related to hypertension. -Troponin was elevated at 18,301.  EKG with anterolateral T wave inversions.  No chest pain symptoms reported. -Her echo shows EF 60-65% on my review.  No regional wall motion abnormalities.  Heavy trabeculation noted and likely a patent muscle in the LV apex but this is not appear to represent thrombus.  Wall motion in the apex is normal. -Suspect this is a demand event secondary to her critical illness.  She is clearly not a candidate for invasive angiography due to acute intraparenchymal hemorrhage.  She is not a candidate for anticoagulation or antiplatelet therapy given ongoing bleed.  She also has CKD stage V and is not a candidate for dialysis as an outpatient.  This would clearly preclude her from an invasive strategy.  She is currently in the ICU and multiorgan system failure in my opinion.  She has brain failure and renal failure.  I do not believe she will survive this.  Neurology has concerns about her prognosis as well. -From a cardiovascular standpoint would recommend to treat her supportively.  The main issue is the  intraparenchymal hemorrhage.  Cardiology will be available as needed.  Please notify us if her condition improves.  Would not recommend to check further troponin values.  [facility] will sign off.   Medication Recommendations: Supportive care as above Other recommendations (labs, testing, etc): None Follow up as an outpatient: None needed at  this time.  For questions or updates, please contact [place] Please consult [email or web address] for contact info under   Time Spent with Patient: I have spent a total of 35 minutes with patient reviewing hospital notes, telemetry, EKGs, labs and examining the patient as well as establishing an assessment and plan that was discussed with the patient.  > 50% of time was spent in direct patient care.    Signed, Addison Naegeli. Audie Box, MD, [place]  06/01/2021 11:07 AM

## 2021-06-01 NOTE — Progress Notes (Addendum)
Na 163, hypertonic saline stopped per order and Dr. Erlinda Hong notified.

## 2021-06-01 NOTE — Progress Notes (Signed)
NAME:  Jaelene Dech, MRN:  [id number], DOB:  [birth year], LOS: 1 ADMISSION DATE:  05/31/2021, CONSULTATION DATE: 10/14 REFERRING MD: Dr. Theda Sers, CHIEF COMPLAINT: Stroke  History of Present Illness:  Patient is a 65 year old female with PMH of ischemic stroke, HTN, HLD, CKD IV, mild dementia, pre-DM presents to [facility] ED on 10/14 for unresponsiveness.  Patient's last known normal at 7:50 AM on 10/14.  [place] RN was unable to get into house today, family called and patient was found unresponsive next to bed.  EMS arrived and patient had seizure like activity in route to [facility] ED.  2.5 mg of Versed given.  Upon arrival to [facility] ED 10/14, patient was intubated for airway protection.  BP was high.  CT head: Large parenchymal hemorrhage within right basal ganglia region and surrounding white matter; mass-effect with 8 mm leftward midline shift without ventricle entrapment. Neuro consulted.  Hypertonic saline started. Started on Cleviprex. Patient admitted to ICU.  PCCM consulted for management of vent while intubated and medical management.  Pertinent  Medical History   Past Medical History:  Diagnosis Date   Hypertension    Stroke ([place])      Significant Hospital Events: Including procedures, antibiotic start and stop dates in addition to other pertinent events   10/14: Code stroke; on cleviprex gtt 10/14 cardiology consulted for high troponin 18 K  Interim History / Subjective:   Critically ill, intubated No urine output Afebrile no more seizure activity    Objective   Blood pressure 127/69, pulse 90, temperature 98.8 F (37.1 C), temperature source Axillary, resp. rate (!) 32, height '5\' 3"'$  (1.6 m), weight 70 kg, SpO2 96 %.    Vent Mode: PRVC FiO2 (%):  [40 %-50 %] 40 % Set Rate:  [18 bmp] 18 bmp Vt Set:  [450 mL] 450 mL PEEP:  [5 cmH20] 5 cmH20 Plateau Pressure:  [15 cmH20-22 cmH20] 22 cmH20   Intake/Output Summary (Last 24 hours) at 06/01/2021 0912 Last data filed at 06/01/2021  0800 Gross per 24 hour  Intake 2275.69 ml  Output 350 ml  Net 1925.69 ml    Filed Weights   05/31/21 1600  Weight: 70 kg    Examination: General:  critically ill appearing on mech vent HEENT: MM pink/moist; ETT in place Neuro: Moves right foot spontaneously, intermittently follows one-step commands, left hemiplegia CV: s1s2, RRR, no m/r/g; on cleviprex PULM: No accessory muscle use, clear breath sounds bilateral GI: soft, bsx4 active  Extremities: warm/dry, no edema  Skin: no rashes or lesions   Labs: Hypernatremia, hyperchloremia, creatinine increased to 6.7 Troponin high at 18 K ABG shows metabolic acidosis   CTA head and neck Large area of parenchymal hemorrhage centered within the right basalganglia region and surrounding white matter. Subarachnoid extension is present into adjacent sulci and ventricular system. Small volume contralateral subarachnoid hemorrhage likely reflects recirculation an absence of trauma.   Mass effect including 8 mm leftward midline shift. No ventricle entrapment at this time.    Patchy ground-glass density in the included upper lobes. May reflect atelectasis, pneumonia, or edema.    Resolved Hospital Problem list     Assessment & Plan:  Large R intracerebral hemorrhage in R basal ganglia w/ 8 mm leftward shift Cerebral edema Hypertensive emergency P: -Further imaging per neuro -Hypertonic saline per protocol -Check echo , blood pressure appears to be at goal -SCDs for VTE PPx -avoid fever; prn tylenol  Acute respiratory failure requiring mechanical ventilation due to above P: -Can start spontaneous  breathing trials but will hold off extubation for 48 hours until certain that cerebral edema is not going to worsen -VAP prevention in place -Continue sedation for RASS 0 to -1 -Daily SBT/SAT  Hypertensive urgency P: -Resume amlodipine and carvedilol, wean off cleviprex drip  AKI on CKD IV Deemed not a candidate for  dialysis P: -We will have to be cautious about fluid overload with 3% saline , can use Lasix if this happens -trend BMP / urinary output -Replace electrolytes as indicated -Avoid nephrotoxic agents, ensure adequate renal perfusion  Leukocytosis , likely stress reaction, no source apparent P: -trend WBC/fever curve -Check urinalysis for completion  Hyperglycemia P: -check a1c -glucose goal 140-180 -SSI and CBG monitoring  GERD P: -PPI  Hx of depression P: -Prozac on hold for now  Updated sister at bedside 10/14  Best Practice (right click and "Reselect all SmartList Selections" daily)   Diet/type: tubefeeds and NPO w/ meds via tube DVT prophylaxis: SCD GI prophylaxis: PPI Lines: N/A Foley:  Yes, and it is still needed Code Status:  full code Last date of multidisciplinary goals of care discussion [per primary]  Labs   CBC: Recent Labs  Lab 05/31/21 1600 05/31/21 1609 05/31/21 1819 06/01/21 0448  WBC 14.9*  --   --  20.9*  NEUTROABS 12.6*  --   --   --   HGB 13.6 13.6 11.2* 11.1*  HCT 40.9 40.0 33.0* 33.3*  MCV 88.0  --   --  88.1  PLT 176  --   --  141*     Basic Metabolic Panel: Recent Labs  Lab 05/31/21 1600 05/31/21 1609 05/31/21 1819 05/31/21 1902 05/31/21 2101 06/01/21 0448  NA 142 144 149* 143 150* 153*  K 3.8 3.9 3.5  --   --  3.9  CL 112* 115*  --   --   --  126*  CO2 14*  --   --   --   --  14*  GLUCOSE 193* 188*  --   --   --  214*  BUN 59* 56*  --   --   --  63*  CREATININE 6.65* 6.90*  --   --   --  6.76*  CALCIUM 9.1  --   --   --   --  8.3*  MG  --   --   --  2.2  --   --   PHOS  --   --   --  5.3*  --   --     GFR: Estimated Creatinine Clearance: 7.9 mL/min (A) (by C-G formula based on SCr of 6.76 mg/dL (H)). Recent Labs  Lab 05/31/21 1600 05/31/21 1902 06/01/21 0448  WBC 14.9*  --  20.9*  LATICACIDVEN  --  3.1* 1.8     Liver Function Tests: Recent Labs  Lab 05/31/21 1600  AST 61*  ALT 25  ALKPHOS 68  BILITOT  0.8  PROT 6.8  ALBUMIN 3.2*    No results for input(s): LIPASE, AMYLASE in the last 168 hours. No results for input(s): AMMONIA in the last 168 hours.  ABG    Component Value Date/Time   PHART 7.321 (L) 05/31/2021 1819   PCO2ART 30.5 (L) 05/31/2021 1819   PO2ART 95 05/31/2021 1819   HCO3 15.7 (L) 05/31/2021 1819   TCO2 17 (L) 05/31/2021 1819   ACIDBASEDEF 9.0 (H) 05/31/2021 1819   O2SAT 97.0 05/31/2021 1819      Coagulation Profile: Recent Labs  Lab 05/31/21 1600  INR 1.1     Cardiac Enzymes: No results for input(s): CKTOTAL, CKMB, CKMBINDEX, TROPONINI in the last 168 hours.  HbA1C: Hgb A1c MFr Bld  Date/Time Value Ref Range Status  08/13/2020 04:18 PM 6.3 (H) 4.8 - 5.6 % Final    Comment:    (NOTE) Pre diabetes:          5.7%-6.4%  Diabetes:              >6.4%  Glycemic control for   <7.0% adults with diabetes     CBG: Recent Labs  Lab 05/31/21 1558 05/31/21 1949 06/01/21 0128 06/01/21 0350 06/01/21 0815  GLUCAP 180* 148* 180* 195* 199*    Critical care time: 33 minutes      Kimbella Heisler V. Elsworth Soho MD [facility] 06/01/2021, 9:12 AM  Please see [email or web address] for pager details.  From 7A-7P if no response, please call [phone]. After hours, please call ELink [phone].

## 2021-06-01 NOTE — Progress Notes (Signed)
Patient transported to MRI and back without complications. RN at bedside. 

## 2021-06-01 NOTE — Progress Notes (Signed)
SLP Cancellation Note  Patient Details Name: Christina Nunez MRN: [id number] DOB: 1956/04/16   Cancelled treatment:       Reason Eval/Treat Not Completed: Medical issues which prohibited therapy (Patient intubatd. Will f/u next date.)  Gabriel Rainwater MA, CCC-SLP   Marlin Jarrard Meryl 06/01/2021, 11:26 AM

## 2021-06-02 ENCOUNTER — Inpatient Hospital Stay (HOSPITAL_COMMUNITY): Payer: Medicaid Other

## 2021-06-02 DIAGNOSIS — R569 Unspecified convulsions: Secondary | ICD-10-CM | POA: Diagnosis not present

## 2021-06-02 DIAGNOSIS — I161 Hypertensive emergency: Secondary | ICD-10-CM | POA: Diagnosis not present

## 2021-06-02 DIAGNOSIS — N179 Acute kidney failure, unspecified: Secondary | ICD-10-CM | POA: Diagnosis not present

## 2021-06-02 DIAGNOSIS — R1312 Dysphagia, oropharyngeal phase: Secondary | ICD-10-CM | POA: Diagnosis not present

## 2021-06-02 DIAGNOSIS — J9600 Acute respiratory failure, unspecified whether with hypoxia or hypercapnia: Secondary | ICD-10-CM | POA: Diagnosis not present

## 2021-06-02 DIAGNOSIS — I610 Nontraumatic intracerebral hemorrhage in hemisphere, subcortical: Secondary | ICD-10-CM | POA: Diagnosis not present

## 2021-06-02 DIAGNOSIS — Z978 Presence of other specified devices: Secondary | ICD-10-CM | POA: Diagnosis not present

## 2021-06-02 LAB — BASIC METABOLIC PANEL
BUN: 74 mg/dL — ABNORMAL HIGH (ref 8–23)
CO2: 16 mmol/L — ABNORMAL LOW (ref 22–32)
Calcium: 8.1 mg/dL — ABNORMAL LOW (ref 8.9–10.3)
Chloride: >130 mmol/L (ref 98–111)
Creatinine, Ser: 7.17 mg/dL — ABNORMAL HIGH (ref 0.44–1.00)
GFR, Estimated: 6 mL/min — ABNORMAL LOW (ref >60)
Glucose, Bld: 222 mg/dL — ABNORMAL HIGH (ref 70–99)
Potassium: 3.9 mmol/L (ref 3.5–5.1)
Sodium: 162 mmol/L (ref 135–145)

## 2021-06-02 LAB — GLUCOSE, CAPILLARY
Glucose-Capillary: 168 mg/dL — ABNORMAL HIGH (ref 70–99)
Glucose-Capillary: 171 mg/dL — ABNORMAL HIGH (ref 70–99)
Glucose-Capillary: 172 mg/dL — ABNORMAL HIGH (ref 70–99)
Glucose-Capillary: 174 mg/dL — ABNORMAL HIGH (ref 70–99)
Glucose-Capillary: 193 mg/dL — ABNORMAL HIGH (ref 70–99)
Glucose-Capillary: 193 mg/dL — ABNORMAL HIGH (ref 70–99)

## 2021-06-02 LAB — SODIUM
Sodium: 160 mmol/L — ABNORMAL HIGH (ref 135–145)
Sodium: 157 mmol/L — ABNORMAL HIGH (ref 135–145)
Sodium: 159 mmol/L — ABNORMAL HIGH (ref 135–145)

## 2021-06-02 LAB — CBC
HCT: 28.7 % — ABNORMAL LOW (ref 36.0–46.0)
Hemoglobin: 8.8 g/dL — ABNORMAL LOW (ref 12.0–15.0)
MCH: 28.5 pg (ref 26.0–34.0)
MCHC: 30.7 g/dL (ref 30.0–36.0)
MCV: 92.9 fL (ref 80.0–100.0)
Platelets: 94 10*3/uL — ABNORMAL LOW (ref 150–400)
RBC: 3.09 MIL/uL — ABNORMAL LOW (ref 3.87–5.11)
RDW: 15.5 % (ref 11.5–15.5)
WBC: 14 10*3/uL — ABNORMAL HIGH (ref 4.0–10.5)
nRBC: 0 % (ref 0.0–0.2)

## 2021-06-02 LAB — PHOSPHORUS: Phosphorus: 4.8 mg/dL — ABNORMAL HIGH (ref 2.5–4.6)

## 2021-06-02 LAB — OSMOLALITY, URINE: Osmolality, Ur: 468 mOsm/kg (ref 300–900)

## 2021-06-02 LAB — MAGNESIUM: Magnesium: 2 mg/dL (ref 1.7–2.4)

## 2021-06-02 IMAGING — DX DG CHEST 1V PORT
1 series · 1 of 1 positions shown · non-contrast
Comparison: Chest x-rays dated [DATE] and [DATE].

CLINICAL DATA: Shortness of breath.  History of CABG.

EXAM:
PORTABLE CHEST 1 VIEW

[chest]
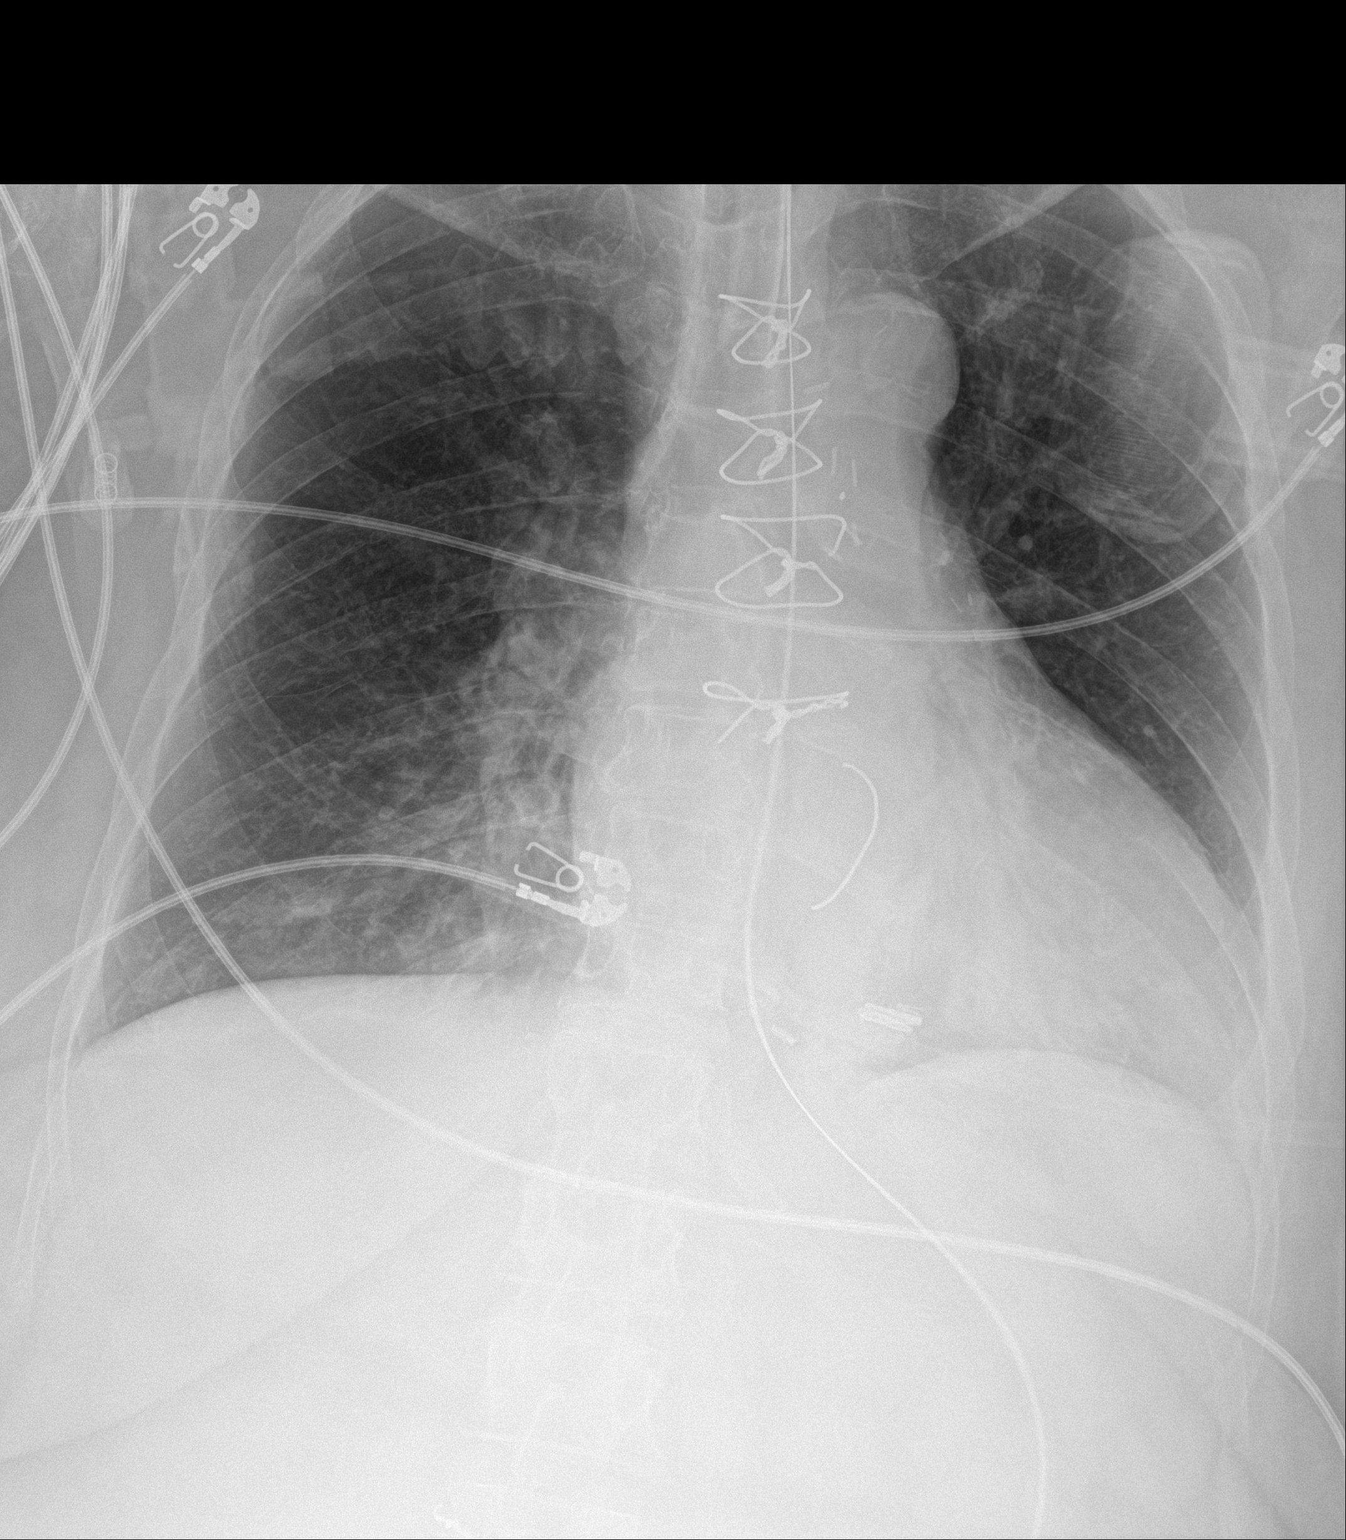

[1 of 1 positions shown; findings below may reference images not displayed]

FINDINGS: Stable cardiomegaly. Endotracheal tube and enteric tube are stable
in position. Lungs are clear. No pleural effusion or pneumothorax is
seen.
IMPRESSION: No active disease. No evidence of pneumonia or pulmonary edema.
Stable cardiomegaly.

## 2021-06-02 MED ORDER — AMLODIPINE BESYLATE 10 MG PO TABS
10.0000 mg | ORAL_TABLET | Freq: Every day | ORAL | Status: DC
  Administered 2021-06-03 – 2021-06-08 (×6): 10 mg
  Filled 2021-06-02 (×6): qty 1

## 2021-06-02 MED ORDER — AMOXICILLIN-POT CLAVULANATE 500-125 MG PO TABS
500.0000 mg | ORAL_TABLET | ORAL | Status: AC
  Administered 2021-06-02 – 2021-06-06 (×5): 500 mg
  Filled 2021-06-02 (×5): qty 1

## 2021-06-02 MED ORDER — SODIUM CHLORIDE 0.9 % IV SOLN: INTRAVENOUS | Status: DC | PRN

## 2021-06-02 MED ORDER — PANTOPRAZOLE 2 MG/ML SUSPENSION
40.0000 mg | Freq: Every day | ORAL | Status: DC
  Administered 2021-06-02 – 2021-06-08 (×7): 40 mg
  Filled 2021-06-02 (×7): qty 20

## 2021-06-02 MED ORDER — FUROSEMIDE 10 MG/ML IJ SOLN
120.0000 mg | Freq: Once | INTRAVENOUS | Status: AC
  Administered 2021-06-02: 120 mg via INTRAVENOUS
  Filled 2021-06-02: qty 10

## 2021-06-02 MED ORDER — AMLODIPINE BESYLATE 5 MG PO TABS
5.0000 mg | ORAL_TABLET | Freq: Once | ORAL | Status: AC
  Administered 2021-06-02: 5 mg
  Filled 2021-06-02: qty 1

## 2021-06-02 NOTE — Progress Notes (Addendum)
PT Cancellation Note  Patient Details Name: Christina Nunez MRN: [id number] DOB: [birth year]   Cancelled Treatment:    Reason Eval/Treat Not Completed: Patient not medically ready;Patient at procedure or test/unavailable. Pt remains intubated without sedation. Currently undergoing EEG. RN reports pt inconsistently following commands. Eyes remain closed. Active movement noted RUE/LE. PT to proceed with eval when pt able to better participate.   Lorriane Shire 06/02/2021, 12:22 PM  Lorrin Goodell, PT  Office # [phone] Pager [phone]

## 2021-06-02 NOTE — Progress Notes (Signed)
Date and time results received: 06/02/21 0100   Test: Sodium Critical Value: 162  Name of Provider Notified: Dr. Lorrin Goodell   Orders Received? Or Actions Taken?: No new orders

## 2021-06-02 NOTE — Progress Notes (Signed)
EEG done at bedside. Results pending.  

## 2021-06-02 NOTE — Procedures (Signed)
History: 65 year old female being evaluated for previously seen seizure activity.  Sedation: None  Technique: This EEG was acquired with electrodes placed according to the International 10-20 electrode system (including Fp1, Fp2, F3, F4, C3, C4, P3, P4, O1, O2, T3, T4, T5, T6, A1, A2, Fz, Cz, Pz). The following electrodes were missing or displaced: none.   Background: There is a posterior dominant rhythm of 7 to 8 Hz which is seen better on the left than right.  Faster frequencies are attenuated throughout the right hemisphere.  There is diffuse irregular slow activity present throughout the study.  In addition, there are frequent periodic discharges with a frequency of 1 to 1.5 Hz with a shifting bifrontal predominance and triphasic morphology.  Photic stimulation: Physiologic driving is not performed  EEG Abnormalities: 1) triphasic waves 2) attenuation of faster frequencies on the right 3) generalized irregular slow activity 4) slow posterior dominant rhythm  Clinical Interpretation: This EEG is consistent with a focal right hemispheric dysfunction (consistent with the known hemorrhagic stroke) in the setting of a generalized nonspecific cerebral dysfunction (encephalopathy).   Triphasic waves are usually an encephalopathic pattern, but can represent cortical irritability in some circumstances.  If a high concern for seizures exists, would consider continuous EEG monitoring.  There was no seizure or definite seizure predisposition recorded on this study. Please note that lack of epileptiform activity on EEG does not preclude the possibility of epilepsy.   Roland Rack, MD [facility] [phone]  If 7pm- 7am, please page neurology on call as listed in [place].

## 2021-06-02 NOTE — Progress Notes (Signed)
OT Cancellation Note  Patient Details Name: Yareni Lindroth MRN: [id number] DOB: 1955-09-22   Cancelled Treatment:    Reason Eval/Treat Not Completed: Other (comment) (Remains intubated/sedated. Will follow up later date.)  [facility], OT/L   Acute OT Clinical Specialist [place] Pager [phone] Office [phone]  06/02/2021, 8:13 AM

## 2021-06-02 NOTE — Progress Notes (Signed)
NAME:  Christina Nunez, MRN:  [id number], DOB:  1956/07/05, LOS: 2 ADMISSION DATE:  05/31/2021, CONSULTATION DATE: 10/14 REFERRING MD: Dr. Theda Sers, CHIEF COMPLAINT: Stroke  History of Present Illness:  Patient is a 65 year old female with PMH of ischemic stroke, HTN, HLD, CKD IV, mild dementia, pre-DM presents to [facility] ED on 10/14 for unresponsiveness.  Patient's last known normal at 7:50 AM on 10/14.  [place] RN was unable to get into house today, family called and patient was found unresponsive next to bed.  EMS arrived and patient had seizure like activity in route to [facility] ED.  2.5 mg of Versed given.  Upon arrival to [facility] ED 10/14, patient was intubated for airway protection.  BP was high.  CT head: Large parenchymal hemorrhage within right basal ganglia region and surrounding white matter; mass-effect with 8 mm leftward midline shift without ventricle entrapment. Neuro consulted.  Hypertonic saline started. Started on Cleviprex. Patient admitted to ICU.  PCCM consulted for management of vent while intubated and medical management.  Pertinent  Medical History   Past Medical History:  Diagnosis Date   Hypertension    Stroke ([place])      Significant Hospital Events: Including procedures, antibiotic start and stop dates in addition to other pertinent events   10/14: adm with RT ICH with IVH & SAH seizure per EMS ,on cleviprex gtt 10/14 cardiology consulted for high troponin 18 K 10/15 MRI stable hematoma, SAH, IVH with midline shift 8 mm  Interim History / Subjective:   Remains critically ill, intubated No further seizure activity Minimal sedation requirements Afebrile Mild secretions per RT    Objective   Blood pressure (!) 142/74, pulse 81, temperature 99 F (37.2 C), temperature source Oral, resp. rate 16, height '5\' 3"'$  (1.6 m), weight 75.3 kg, SpO2 100 %.    Vent Mode: PSV;CPAP FiO2 (%):  [40 %] 40 % Set Rate:  [18 bmp] 18 bmp Vt Set:  [450 mL] 450 mL PEEP:  [5 cmH20] 5  cmH20 Pressure Support:  [10 cmH20] 10 cmH20 Plateau Pressure:  [9 cmH20-22 cmH20] 9 cmH20   Intake/Output Summary (Last 24 hours) at 06/02/2021 0814 Last data filed at 06/02/2021 0700 Gross per 24 hour  Intake 1549.84 ml  Output 320 ml  Net 1229.84 ml    Filed Weights   05/31/21 1600 06/02/21 0500  Weight: 70 kg 75.3 kg    Examination: General:  critically ill appearing on mech vent HEENT: MM pink/moist; ETT in place Neuro: Follows one-step commands, moves right foot spontaneously, left hemiplegia CV: s1s2, RRR, no m/r/g; on cleviprex PULM: Clear breath sounds bilateral, no accessory muscle use GI: soft, bsx4 active  Extremities: warm/dry, no edema  Skin: no rashes or lesions   Labs: Sodium increased to 162, hyperchloremia, creatinine increased to 7.1, mild leukocytosis-improving, stable anemia Peak Troponin high at 18 K ABG shows metabolic acidosis   CTA head and neck Large area of parenchymal hemorrhage centered within the right basalganglia region and surrounding white matter. Subarachnoid extension is present into adjacent sulci and ventricular system. Small volume contralateral subarachnoid hemorrhage likely reflects recirculation an absence of trauma.   Mass effect including 8 mm leftward midline shift. No ventricle entrapment at this time.    Patchy ground-glass density in the included upper lobes. May reflect atelectasis, pneumonia, or edema.    Resolved Hospital Problem list     Assessment & Plan:  Large R intracerebral hemorrhage in R basal ganglia w/ 8 mm leftward shift Cerebral edema  P: -Imaging stable -Hypertonic saline  dc'd -avoid fever; prn tylenol  Acute respiratory failure requiring mechanical ventilation due to above P: -Can start spontaneous breathing trials  -VAP prevention in place -Daily SBT/SAT  Hypertensive urgency P: -Resume amlodipine and carvedilol, off cleviprex drip  AKI on CKD IV , rising creatinine Deemed not a  candidate for dialysis P: -We will try Lasix 120 -trend BMP / urinary output -Replace electrolytes as indicated -Avoid nephrotoxic agents, ensure adequate renal perfusion  Leukocytosis , likely stress reaction, no source apparent Some infiltrates on CT P: Due to thick secretions, will add oral Augmentin for 5 days   Hyperglycemia  a1c 6 P: -glucose goal 140-180 -SSI and CBG monitoring  GERD P: -PPI  Hx of depression P: -Prozac on hold for now  Summary -she had a poor baseline and dementia was worsening, not felt to be a candidate for hemodialysis.  Goals of care discussion here, if neuro prognosis is poor then would advocate for one-way extubation once we are certain that we have treated the treatable  Best Practice (right click and "Reselect all SmartList Selections" daily)   Diet/type: tubefeeds and NPO w/ meds via tube DVT prophylaxis: SCD GI prophylaxis: PPI Lines: N/A Foley:  Yes, and it is still needed Code Status:  full code Last date of multidisciplinary goals of care discussion [per primary]  Labs   CBC: Recent Labs  Lab 05/31/21 1600 05/31/21 1609 05/31/21 1819 06/01/21 0448 06/02/21 0052  WBC 14.9*  --   --  20.9* 14.0*  NEUTROABS 12.6*  --   --   --   --   HGB 13.6 13.6 11.2* 11.1* 8.8*  HCT 40.9 40.0 33.0* 33.3* 28.7*  MCV 88.0  --   --  88.1 92.9  PLT 176  --   --  141* 94*     Basic Metabolic Panel: Recent Labs  Lab 05/31/21 1600 05/31/21 1609 05/31/21 1819 05/31/21 1902 05/31/21 2101 06/01/21 0448 06/01/21 1458 06/01/21 2218 06/02/21 0052  NA 142 144 149* 143 150* 153* 163* 163* 162*  K 3.8 3.9 3.5  --   --  3.9  --   --  3.9  CL 112* 115*  --   --   --  126*  --   --  >130*  CO2 14*  --   --   --   --  14*  --   --  16*  GLUCOSE 193* 188*  --   --   --  214*  --   --  222*  BUN 59* 56*  --   --   --  63*  --   --  74*  CREATININE 6.65* 6.90*  --   --   --  6.76*  --   --  7.17*  CALCIUM 9.1  --   --   --   --  8.3*  --   --   8.1*  MG  --   --   --  2.2  --   --   --   --  2.0  PHOS  --   --   --  5.3*  --   --   --   --  4.8*    GFR: Estimated Creatinine Clearance: 7.7 mL/min (A) (by C-G formula based on SCr of 7.17 mg/dL (H)). Recent Labs  Lab 05/31/21 1600 05/31/21 1902 06/01/21 0448 06/02/21 0052  WBC 14.9*  --  20.9* 14.0*  LATICACIDVEN  --  3.1*  1.8  --      Liver Function Tests: Recent Labs  Lab 05/31/21 1600  AST 61*  ALT 25  ALKPHOS 68  BILITOT 0.8  PROT 6.8  ALBUMIN 3.2*    No results for input(s): LIPASE, AMYLASE in the last 168 hours. No results for input(s): AMMONIA in the last 168 hours.  ABG    Component Value Date/Time   PHART 7.321 (L) 05/31/2021 1819   PCO2ART 30.5 (L) 05/31/2021 1819   PO2ART 95 05/31/2021 1819   HCO3 15.7 (L) 05/31/2021 1819   TCO2 17 (L) 05/31/2021 1819   ACIDBASEDEF 9.0 (H) 05/31/2021 1819   O2SAT 97.0 05/31/2021 1819      Coagulation Profile: Recent Labs  Lab 05/31/21 1600  INR 1.1     Cardiac Enzymes: No results for input(s): CKTOTAL, CKMB, CKMBINDEX, TROPONINI in the last 168 hours.  HbA1C: Hgb A1c MFr Bld  Date/Time Value Ref Range Status  05/31/2021 07:02 PM 6.0 (H) 4.8 - 5.6 % Final    Comment:    (NOTE) Pre diabetes:          5.7%-6.4%  Diabetes:              >6.4%  Glycemic control for   <7.0% adults with diabetes   08/13/2020 04:18 PM 6.3 (H) 4.8 - 5.6 % Final    Comment:    (NOTE) Pre diabetes:          5.7%-6.4%  Diabetes:              >6.4%  Glycemic control for   <7.0% adults with diabetes     CBG: Recent Labs  Lab 06/01/21 1214 06/01/21 1628 06/01/21 1914 06/01/21 2314 06/02/21 0452  GLUCAP 170* 146* 206* 186* 193*    Critical care time: 32 minutes      Braylon Lemmons V. Elsworth Soho MD [facility] 06/02/2021, 8:14 AM  Please see [email or web address] for pager details.  From 7A-7P if no response, please call [phone]. After hours, please call ELink [phone].

## 2021-06-02 NOTE — Progress Notes (Signed)
STROKE TEAM PROGRESS NOTE   INTERVAL HISTORY RN and EEG tech are at bedside.  Patient open eyes on voice today, still has right gaze preference but able to have left gaze on the right eye but left gaze palsy on the left eye. Still follows commands on the right. Renal function still worsening. No fever, BP stable.   Vitals:   06/02/21 1008 06/02/21 1030 06/02/21 1053 06/02/21 1054  BP: (!) 154/88 (!) 143/69 (!) 143/69   Pulse: 67 61 63   Resp: '14 14 14   '$ Temp:      TempSrc:      SpO2: 100% 100% 100% 100%  Weight:      Height:       CBC:  Recent Labs  Lab 05/31/21 1600 05/31/21 1609 06/01/21 0448 06/02/21 0052  WBC 14.9*  --  20.9* 14.0*  NEUTROABS 12.6*  --   --   --   HGB 13.6   < > 11.1* 8.8*  HCT 40.9   < > 33.3* 28.7*  MCV 88.0  --  88.1 92.9  PLT 176  --  141* 94*   < > = values in this interval not displayed.   Basic Metabolic Panel:  Recent Labs  Lab 05/31/21 1902 05/31/21 2101 06/01/21 0448 06/01/21 1458 06/02/21 0052 06/02/21 0956  NA 143   < > 153*   < > 162* 160*  K  --   --  3.9  --  3.9  --   CL  --   --  126*  --  >130*  --   CO2  --   --  14*  --  16*  --   GLUCOSE  --   --  214*  --  222*  --   BUN  --   --  63*  --  74*  --   CREATININE  --   --  6.76*  --  7.17*  --   CALCIUM  --   --  8.3*  --  8.1*  --   MG 2.2  --   --   --  2.0  --   PHOS 5.3*  --   --   --  4.8*  --    < > = values in this interval not displayed.   Lipid Panel:  Recent Labs  Lab 05/31/21 1902 06/01/21 0448  CHOL 156  --   TRIG 179* 184*  HDL 33*  --   CHOLHDL 4.7  --   VLDL 36  --   LDLCALC 87  --    HgbA1c:  Recent Labs  Lab 05/31/21 1902  HGBA1C 6.0*   Urine Drug Screen: No results for input(s): LABOPIA, COCAINSCRNUR, LABBENZ, AMPHETMU, THCU, LABBARB in the last 168 hours.  Alcohol Level  Recent Labs  Lab 05/31/21 1902  ETH <10    IMAGING past 24 hours MR BRAIN WO CONTRAST  Result Date: 06/01/2021 EXAM: MRI HEAD WITHOUT CONTRAST TECHNIQUE:  Multiplanar, multiecho pulse sequences of the brain and surrounding structures were obtained without intravenous contrast. COMPARISON:  CT May 31, 2021. FINDINGS: Brain: Very large intraparenchymal hematoma centered in the right basal ganglia, likely similar in size when comparing across modalities. The hemorrhage measures up to approximately 6.3 x 4.3 x 4.9 cm.Similar effacement of the right lateral ventricle with approximately 8 mm of leftward midline shift. Similar moderate volume of intraventricular hemorrhage and scattered subarachnoid hemorrhage. No evidence of surrounding infarct. No definite underlying mass; however, the hemorrhage is  very heterogeneous and acute blood products limits evaluation. Edema in the adjacent brain parenchyma, including the basal ganglia and surrounding frontal, parietal and temporal lobes. Similar ventricular size. Vascular: Major arterial flow voids are maintained at the skull base. Skull and upper cervical spine: Normal marrow signal. Sinuses/Orbits: Mild paranasal sinus mucosal thickening. Other: No sizable mastoid effusions. IMPRESSION: 1. Very large intraparenchymal hematoma centered in the right basal ganglia, likely similar in size when comparing across modalities. Similar effacement of the right lateral ventricle with approximately 8 mm of leftward midline shift. No evidence of surrounding infarct to suggest hemorrhagic conversion of infarct. No definite underlying mass; however, acute blood products limits evaluation. A follow up mri with contrast may be useful to fully exclude underlying mass. 2. Similar moderate volume of intraventricular hemorrhage and scattered subarachnoid hemorrhage. Similar ventricular size. Electronically Signed   By: Margaretha Sheffield M.D.   On: 06/01/2021 18:13   DG Chest Port 1 View  Result Date: 06/02/2021 CLINICAL DATA:  Shortness of breath.  History of CABG. EXAM: PORTABLE CHEST 1 VIEW COMPARISON:  Chest x-rays dated 05/31/2021 and  08/13/2020. FINDINGS: Stable cardiomegaly. Endotracheal tube and enteric tube are stable in position. Lungs are clear. No pleural effusion or pneumothorax is seen. IMPRESSION: No active disease. No evidence of pneumonia or pulmonary edema. Stable cardiomegaly. Electronically Signed   By: Franki Cabot M.D.   On: 06/02/2021 08:27    PHYSICAL EXAM  Temp:  [96.9 F (36.1 C)-99.8 F (37.7 C)] 96.9 F (36.1 C) (10/16 0737) Pulse Rate:  [61-89] 63 (10/16 1053) Resp:  [13-37] 14 (10/16 1053) BP: (124-175)/(63-105) 143/69 (10/16 1053) SpO2:  [97 %-100 %] 100 % (10/16 1054) FiO2 (%):  [40 %] 40 % (10/16 1054) Weight:  [75.3 kg] 75.3 kg (10/16 0500)  General - Well nourished, well developed, intubated off sedation.  Ophthalmologic - fundi not visualized due to noncooperation.  Cardiovascular - Regular rate and rhythm.  Neuro - intubated not on sedation, eyes closed, but open on voice, eyes right gaze preference, nystagmus with right gaze and direction to the right. Right eye able to have brief left gaze but left eye with left gaze palsy, blinking to visual threat inconsistently on the right but not on the left, pupils 2.21m, sluggish to light. Corneal reflex present, gag and cough present. Breathing over the vent.  Facial symmetry not able to test due to ET tube.  Tongue protrusion not cooperative. Follows simple commands on the right hand and foot, no movement of left UE and LE. RUE proximal 3/5 and finger grip 2+/5. RLE withdraw to pain but not able to against gravity, toe DF/PF 2+/5. DTR 1+ and no babinski. Sensation, coordination and gait not tested.    ASSESSMENT/PLAN Ms. JCamani Taubmanis a 65yo female with a PMHx of mild dementia, CKD IV, pre DM, HLD, ischemic stroke, HTN, and depression admitted for unresponsiveness. Patient had seizure activity en route and was given Versed 2.'5mg'$  per EMS.   ICH - right BG large ICH with IVH and SAH, likely due [place]- large area of  parenchymal hemorrhage centered within the right basal ganglia region. Subarachnoid extention also present with intraventricular extension (ICH 4). 828mleftward midline shift.  CTA head & neck No AVM or aneurysm CT repeat stable hematoma, midline shift, no hydrocephalus MRI brain stable hematoma, SAH, IVH and midline shift 8 mm 2D Echo EF 60 to 65% LDL 128 HgbA1c 6.3 VTE prophylaxis - SCDs aspirin 81 mg daily and  clopidogrel 75 mg daily prior to admission, now on No antithrombotic given ICH Therapy recommendations: Pending Disposition: Pending  Cerebral edema CT and MRI showed stable midline shift 8 mm On 3% saline @ 75 -> FW 200 Q4h Na [id number] Na monitoring Urine osmolarity 468 Urine specific gravity pending  Respiratory failure Intubated on ventilation CCM on board Not candidate for extubation  AKI on CKD  Cre 6.65->6.90->6.76->7.17 Baseline around 4 in 08/2020 Per CCM, patient deemed not a candidate for dialysis Gentle hydration S/p lasix x 1 today CCM on board  Seizure Witnessed seizure with EMS Stat post Versed 2.5 mg Currently no seizure activity, follow commands on the right side EEG pending Keppra 250 twice daily per renal adjustment  History of stroke 07/2020 admitted for right CR/SO lacunar infarct on MRI.  CT no acute abnormality.  MRI also showed old left cerebellum, left BG and thalamic, bilateral SO and CR infarcts.  MRI head and neck bilateral siphon moderate atherosclerosis, left more than right.  DVT negative.  EF 50 to 55%.  LDL 128, A1c 6.3.  Patient discharged with DAPT and Lipitor 40.  Hypertensive emergency Home meds: Lasix, Coreg 6.25 Stable, off Cleviprex Labetalol IV PRN On amlodipine 5->10 and Coreg 12.5 Long-term BP goal normotensive  Hyperlipidemia Home meds: Lipitor 40,  LDL 128, goal < 70 Consider statin at discharge  Dysphagia On tube feeding @ 50 On FW 200 Q4  Other Stroke Risk Factors CAD status post  CABG  Other Active Problems Leukocytosis WBC 20.9 Anemia of chronic disease, hemoglobin 11.1  Hospital day # 2  This patient is critically ill due to large right BG ICH, SAH, IVH, cerebral edema, seizure, AKI on CKD, hypertensive emergency and at significant risk of neurological worsening, death form brain herniation, renal failure, status epilepticus, hydrocephalus. This patient's care requires constant monitoring of vital signs, hemodynamics, respiratory and cardiac monitoring, review of multiple databases, neurological assessment, discussion with family, other specialists and medical decision making of high complexity. I spent 40 minutes of neurocritical care time in the care of this patient. I also discussed with CCM Dr. Elsworth Soho.   Rosalin Hawking, MD PhD Stroke Neurology 06/02/2021 11:42 AM   To contact Stroke Continuity provider, please refer to [email or web address] After hours, contact General Neurology

## 2021-06-03 DIAGNOSIS — J9601 Acute respiratory failure with hypoxia: Secondary | ICD-10-CM | POA: Diagnosis not present

## 2021-06-03 DIAGNOSIS — I609 Nontraumatic subarachnoid hemorrhage, unspecified: Secondary | ICD-10-CM | POA: Diagnosis not present

## 2021-06-03 DIAGNOSIS — J9600 Acute respiratory failure, unspecified whether with hypoxia or hypercapnia: Secondary | ICD-10-CM | POA: Diagnosis not present

## 2021-06-03 DIAGNOSIS — I615 Nontraumatic intracerebral hemorrhage, intraventricular: Secondary | ICD-10-CM | POA: Diagnosis not present

## 2021-06-03 DIAGNOSIS — I610 Nontraumatic intracerebral hemorrhage in hemisphere, subcortical: Secondary | ICD-10-CM | POA: Diagnosis not present

## 2021-06-03 LAB — BASIC METABOLIC PANEL
Anion gap: 6 (ref 5–15)
BUN: 82 mg/dL — ABNORMAL HIGH (ref 8–23)
CO2: 17 mmol/L — ABNORMAL LOW (ref 22–32)
Calcium: 8.2 mg/dL — ABNORMAL LOW (ref 8.9–10.3)
Chloride: 129 mmol/L — ABNORMAL HIGH (ref 98–111)
Creatinine, Ser: 7.38 mg/dL — ABNORMAL HIGH (ref 0.44–1.00)
GFR, Estimated: 6 mL/min — ABNORMAL LOW (ref >60)
Glucose, Bld: 229 mg/dL — ABNORMAL HIGH (ref 70–99)
Potassium: 3.5 mmol/L (ref 3.5–5.1)
Sodium: 152 mmol/L — ABNORMAL HIGH (ref 135–145)

## 2021-06-03 LAB — CBC
HCT: 27.3 % — ABNORMAL LOW (ref 36.0–46.0)
Hemoglobin: 8.6 g/dL — ABNORMAL LOW (ref 12.0–15.0)
MCH: 29 pg (ref 26.0–34.0)
MCHC: 31.5 g/dL (ref 30.0–36.0)
MCV: 91.9 fL (ref 80.0–100.0)
Platelets: 93 10*3/uL — ABNORMAL LOW (ref 150–400)
RBC: 2.97 MIL/uL — ABNORMAL LOW (ref 3.87–5.11)
RDW: 16 % — ABNORMAL HIGH (ref 11.5–15.5)
WBC: 13.5 10*3/uL — ABNORMAL HIGH (ref 4.0–10.5)
nRBC: 0.1 % (ref 0.0–0.2)

## 2021-06-03 LAB — GLUCOSE, CAPILLARY
Glucose-Capillary: 189 mg/dL — ABNORMAL HIGH (ref 70–99)
Glucose-Capillary: 217 mg/dL — ABNORMAL HIGH (ref 70–99)
Glucose-Capillary: 237 mg/dL — ABNORMAL HIGH (ref 70–99)
Glucose-Capillary: 242 mg/dL — ABNORMAL HIGH (ref 70–99)
Glucose-Capillary: 244 mg/dL — ABNORMAL HIGH (ref 70–99)
Glucose-Capillary: 249 mg/dL — ABNORMAL HIGH (ref 70–99)

## 2021-06-03 MED ORDER — HEPARIN SODIUM (PORCINE) 5000 UNIT/ML IJ SOLN
5000.0000 [IU] | Freq: Three times a day (TID) | INTRAMUSCULAR | Status: DC
  Administered 2021-06-03 – 2021-06-08 (×14): 5000 [IU] via SUBCUTANEOUS
  Filled 2021-06-03 (×14): qty 1

## 2021-06-03 MED ORDER — CHLORHEXIDINE GLUCONATE 0.12 % MT SOLN
OROMUCOSAL | Status: AC
  Administered 2021-06-03: 15 mL via OROMUCOSAL
  Filled 2021-06-03: qty 15

## 2021-06-03 MED ORDER — SODIUM CHLORIDE 0.9 % IV SOLN: INTRAVENOUS | Status: DC

## 2021-06-03 NOTE — Progress Notes (Signed)
NAME:  Christina Nunez, MRN:  [id number], DOB:  13-Feb-1956, LOS: 3 ADMISSION DATE:  05/31/2021, CONSULTATION DATE: 10/14 REFERRING MD: Dr. Theda Sers, CHIEF COMPLAINT: Stroke  History of Present Illness:  Patient is a 65 year old female with PMH of ischemic stroke, HTN, HLD, CKD IV, mild dementia, pre-DM presents to [facility] ED on 10/14 for unresponsiveness.  Patient's last known normal at 7:50 AM on 10/14.  [place] RN was unable to get into house today, family called and patient was found unresponsive next to bed.  EMS arrived and patient had seizure like activity in route to [facility] ED.  2.5 mg of Versed given.  Upon arrival to [facility] ED 10/14, patient was intubated for airway protection.  BP was high.  CT head: Large parenchymal hemorrhage within right basal ganglia region and surrounding white matter; mass-effect with 8 mm leftward midline shift without ventricle entrapment. Neuro consulted.  Hypertonic saline started. Started on Cleviprex. Patient admitted to ICU.  PCCM consulted for management of vent while intubated and medical management.  Pertinent  Medical History   Past Medical History:  Diagnosis Date   Hypertension    Stroke ([place])      Significant Hospital Events: Including procedures, antibiotic start and stop dates in addition to other pertinent events   10/14: adm with RT ICH with IVH & SAH seizure per EMS ,on cleviprex gtt 10/14 cardiology consulted for high troponin 18 K 10/15 MRI stable hematoma, SAH, IVH with midline shift 8 mm 10/16 EEG shows no evidence of seizure activity.  Interim History / Subjective:   Remains intubated.  Follows commands predictably.  Minimal secretions.  Objective   Blood pressure (!) 163/83, pulse 70, temperature 98.6 F (37 C), temperature source Axillary, resp. rate 17, height '5\' 3"'$  (1.6 m), weight 75.3 kg, SpO2 100 %.    Vent Mode: PSV;CPAP FiO2 (%):  [40 %] 40 % Set Rate:  [18 bmp] 18 bmp Vt Set:  [450 mL] 450 mL PEEP:  [5 cmH20] 5 cmH20 Pressure  Support:  [8 cmH20-10 cmH20] 8 cmH20 Plateau Pressure:  [14 cmH20-19 cmH20] 19 cmH20   Intake/Output Summary (Last 24 hours) at 06/03/2021 1013 Last data filed at 06/03/2021 0800 Gross per 24 hour  Intake 1920.81 ml  Output 645 ml  Net 1275.81 ml    Filed Weights   05/31/21 1600 06/02/21 0500  Weight: 70 kg 75.3 kg    Examination: General:  critically ill appearing on mech vent HEENT: MM pink/moist; ETT in place Neuro: Follows one-step commands with good strength on the right side.  No response to pain on the left.  Able to swallow to command.  Nods appropriately to questions. CV: s1s2, RRR, no m/r/g; on cleviprex PULM: Clear breath sounds bilateral, no accessory muscle use GI: soft, bsx4 active  Extremities: warm/dry, no edema  Skin: no rashes or lesions   Labs: Resolving hypernatremia.  Creatinine remains elevated 7.  3 8.  Resolved Hospital Problem list     Assessment & Plan:  Critically ill due to acute respiratory failure with hypoxia and hypercarbia related to large R intracerebral hemorrhage in R basal ganglia w/ 8 mm leftward shift from cerebral edema. Hypertensive urgency AKI on CKD IV , rising creatinine Deemed not a candidate for dialysis by her outpatient nephrology Possible aspiration pneumonia Type 2 diabetes with hyperglycemia Hx of depression  Baseline function is poor and patient is not a candidate for long-term hemodialysis.  Best hope would be to tolerate one-way trial of extubation.  Plan:  -  Continue daily SBT.  Failed today due to low respiratory rate.  Still not through cerebral edema window, so may still improve further over the next few days.  May be ready for trial of extubation in 48 hours or so. -Keep off all sedative infusions. -Continue current enteral antihypertensive regimen -Continue Keppra for prior seizure activity -Complete 5 days of Augmentin for possible aspiration  Best Practice (right click and "Reselect all SmartList  Selections" daily)   Diet/type: tubefeeds and NPO w/ meds via tube DVT prophylaxis: SCD GI prophylaxis: PPI Lines: N/A Foley:  Yes, and it is still needed Code Status:  full code Last date of multidisciplinary goals of care discussion [per primary]  Labs   CBC: Recent Labs  Lab 05/31/21 1600 05/31/21 1609 05/31/21 1819 06/01/21 0448 06/02/21 0052 06/03/21 0652  WBC 14.9*  --   --  20.9* 14.0* 13.5*  NEUTROABS 12.6*  --   --   --   --   --   HGB 13.6 13.6 11.2* 11.1* 8.8* 8.6*  HCT 40.9 40.0 33.0* 33.3* 28.7* 27.3*  MCV 88.0  --   --  88.1 92.9 91.9  PLT 176  --   --  141* 94* 93*     Basic Metabolic Panel: Recent Labs  Lab 05/31/21 1600 05/31/21 1609 05/31/21 1819 05/31/21 1902 05/31/21 2101 06/01/21 0448 06/01/21 1458 06/02/21 0052 06/02/21 0956 06/02/21 1700 06/02/21 2218 06/03/21 0652  NA 142 144 149* 143   < > 153*   < > 162* 160* 157* 159* 152*  K 3.8 3.9 3.5  --   --  3.9  --  3.9  --   --   --  3.5  CL 112* 115*  --   --   --  126*  --  >130*  --   --   --  129*  CO2 14*  --   --   --   --  14*  --  16*  --   --   --  17*  GLUCOSE 193* 188*  --   --   --  214*  --  222*  --   --   --  229*  BUN 59* 56*  --   --   --  63*  --  74*  --   --   --  82*  CREATININE 6.65* 6.90*  --   --   --  6.76*  --  7.17*  --   --   --  7.38*  CALCIUM 9.1  --   --   --   --  8.3*  --  8.1*  --   --   --  8.2*  MG  --   --   --  2.2  --   --   --  2.0  --   --   --   --   PHOS  --   --   --  5.3*  --   --   --  4.8*  --   --   --   --    < > = values in this interval not displayed.    GFR: Estimated Creatinine Clearance: 7.5 mL/min (A) (by C-G formula based on SCr of 7.38 mg/dL (H)). Recent Labs  Lab 05/31/21 1600 05/31/21 1902 06/01/21 0448 06/02/21 0052 06/03/21 0652  WBC 14.9*  --  20.9* 14.0* 13.5*  LATICACIDVEN  --  3.1* 1.8  --   --  Liver Function Tests: Recent Labs  Lab 05/31/21 1600  AST 61*  ALT 25  ALKPHOS 68  BILITOT 0.8  PROT 6.8   ALBUMIN 3.2*    No results for input(s): LIPASE, AMYLASE in the last 168 hours. No results for input(s): AMMONIA in the last 168 hours.  ABG    Component Value Date/Time   PHART 7.321 (L) 05/31/2021 1819   PCO2ART 30.5 (L) 05/31/2021 1819   PO2ART 95 05/31/2021 1819   HCO3 15.7 (L) 05/31/2021 1819   TCO2 17 (L) 05/31/2021 1819   ACIDBASEDEF 9.0 (H) 05/31/2021 1819   O2SAT 97.0 05/31/2021 1819      Coagulation Profile: Recent Labs  Lab 05/31/21 1600  INR 1.1     Cardiac Enzymes: No results for input(s): CKTOTAL, CKMB, CKMBINDEX, TROPONINI in the last 168 hours.  HbA1C: Hgb A1c MFr Bld  Date/Time Value Ref Range Status  05/31/2021 07:02 PM 6.0 (H) 4.8 - 5.6 % Final    Comment:    (NOTE) Pre diabetes:          5.7%-6.4%  Diabetes:              >6.4%  Glycemic control for   <7.0% adults with diabetes   08/13/2020 04:18 PM 6.3 (H) 4.8 - 5.6 % Final    Comment:    (NOTE) Pre diabetes:          5.7%-6.4%  Diabetes:              >6.4%  Glycemic control for   <7.0% adults with diabetes     CBG: Recent Labs  Lab 06/02/21 1604 06/02/21 2010 06/02/21 2350 06/03/21 0349 06/03/21 0808  GLUCAP 172* 171* 193* 189* 217*    CRITICAL CARE Performed by: Kipp Brood   Total critical care time: 40 minutes  Critical care time was exclusive of separately billable procedures and treating other patients.  Critical care was necessary to treat or prevent imminent or life-threatening deterioration.  Critical care was time spent personally by me on the following activities: development of treatment plan with patient and/or surrogate as well as nursing, discussions with consultants, evaluation of patient's response to treatment, examination of patient, obtaining history from patient or surrogate, ordering and performing treatments and interventions, ordering and review of laboratory studies, ordering and review of radiographic studies, pulse oximetry, re-evaluation of  patient's condition and participation in multidisciplinary rounds.  Kipp Brood, MD [facility] ICU Physician [place]  Pager: [phone] Mobile: [phone] After hours: [phone].

## 2021-06-03 NOTE — Progress Notes (Signed)
STROKE TEAM PROGRESS NOTE   INTERVAL HISTORY RN is at bedside.  Patient barely open eyes today, still has right gaze but continues tol follow commands on the right. Renal function still worsening. No fever, BP stable. EEG no fever but encephalopathy.    Vitals:   06/03/21 1116 06/03/21 1130 06/03/21 1200 06/03/21 1230  BP:  136/70 139/84 137/71  Pulse:  70 74 81  Resp:  '15 13 16  '$ Temp:   98.5 F (36.9 C)   TempSrc:   Axillary   SpO2: 100% 100% 100% 100%  Weight:      Height:       CBC:  Recent Labs  Lab 05/31/21 1600 05/31/21 1609 06/02/21 0052 06/03/21 0652  WBC 14.9*   < > 14.0* 13.5*  NEUTROABS 12.6*  --   --   --   HGB 13.6   < > 8.8* 8.6*  HCT 40.9   < > 28.7* 27.3*  MCV 88.0   < > 92.9 91.9  PLT 176   < > 94* 93*   < > = values in this interval not displayed.   Basic Metabolic Panel:  Recent Labs  Lab 05/31/21 1902 05/31/21 2101 06/02/21 0052 06/02/21 0956 06/02/21 2218 06/03/21 0652  NA 143   < > 162*   < > 159* 152*  K  --    < > 3.9  --   --  3.5  CL  --    < > >130*  --   --  129*  CO2  --    < > 16*  --   --  17*  GLUCOSE  --    < > 222*  --   --  229*  BUN  --    < > 74*  --   --  82*  CREATININE  --    < > 7.17*  --   --  7.38*  CALCIUM  --    < > 8.1*  --   --  8.2*  MG 2.2  --  2.0  --   --   --   PHOS 5.3*  --  4.8*  --   --   --    < > = values in this interval not displayed.   Lipid Panel:  Recent Labs  Lab 05/31/21 1902 06/01/21 0448  CHOL 156  --   TRIG 179* 184*  HDL 33*  --   CHOLHDL 4.7  --   VLDL 36  --   LDLCALC 87  --    HgbA1c:  Recent Labs  Lab 05/31/21 1902  HGBA1C 6.0*   Urine Drug Screen: No results for input(s): LABOPIA, COCAINSCRNUR, LABBENZ, AMPHETMU, THCU, LABBARB in the last 168 hours.  Alcohol Level  Recent Labs  Lab 05/31/21 1902  ETH <10    IMAGING past 24 hours No results found.  PHYSICAL EXAM  Temp:  [97.7 F (36.5 C)-99.2 F (37.3 C)] 98.5 F (36.9 C) (10/17 1200) Pulse Rate:  [62-83] 81  (10/17 1230) Resp:  [12-29] 16 (10/17 1230) BP: (135-165)/(70-89) 137/71 (10/17 1230) SpO2:  [100 %] 100 % (10/17 1230) FiO2 (%):  [40 %] 40 % (10/17 1116)  General - Well nourished, well developed, intubated off sedation.  Ophthalmologic - fundi not visualized due to noncooperation.  Cardiovascular - Regular rate and rhythm.  Neuro - intubated not on sedation, eyes closed, barely open on voice, eyes right forced gaze, not able to have left gaze. Blinking to visual  threat inconsistently on the right but not on the left, pupils 2.65m, sluggish to light. Corneal reflex present, gag and cough present. Breathing over the vent.  Facial symmetry not able to test due to ET tube.  Tongue protrusion not cooperative. Follows simple commands on the right hand and foot, no movement of left UE and LE. RUE proximal 3/5 and finger grip 2+/5. RLE withdraw to pain but not able to against gravity, toe DF/PF 2+/5. DTR 1+ and no babinski. Sensation, coordination and gait not tested.    ASSESSMENT/PLAN Ms. JMandolyn Mctigueis a 65yo female with a PMHx of mild dementia, CKD IV, pre DM, HLD, ischemic stroke, HTN, and depression admitted for unresponsiveness. Patient had seizure activity en route and was given Versed 2.'5mg'$  per EMS.   ICH - right BG large ICH with IVH and SAH, likely due to hypertensive emergency CVienna- large area of parenchymal hemorrhage centered within the right basal ganglia region. Subarachnoid extention also present with intraventricular extension (ICH 4). 866mleftward midline shift.  CTA head & neck No AVM or aneurysm CT repeat stable hematoma, midline shift, no hydrocephalus MRI brain stable hematoma, SAH, IVH and midline shift 8 mm 2D Echo EF 60 to 65% EEG no seizure but encephalopathy LDL 128 HgbA1c 6.3 VTE prophylaxis - SCDs aspirin 81 mg daily and clopidogrel 75 mg daily prior to admission, now on No antithrombotic given ICH Therapy recommendations: Pending Disposition:  Pending  Cerebral edema CT and MRI showed stable midline shift 8 mm On 3% saline @ 75 -> FW 200 Q4h ->NS @ 50 Na 142-149-153-163-163-160->157->152 Na monitoring Urine osmolarity 468  Respiratory failure Intubated on ventilation CCM on board Not candidate for extubation  AKI on CKD  Cre 6.65->6.90->6.76->7.17->7.38 Baseline around 4 in 08/2020 Per daughter, patient deemed not a candidate for dialysis by her nephrologist Gentle hydration S/p lasix x 1 CCM on board  Seizure Witnessed seizure with EMS Stat post Versed 2.5 mg Currently no clinical seizure EEG no seizure but encephalopathy Keppra 250 twice daily per renal adjustment  History of stroke 07/2020 admitted for right CR/SO lacunar infarct on MRI.  CT no acute abnormality.  MRI also showed old left cerebellum, left BG and thalamic, bilateral SO and CR infarcts.  MRI head and neck bilateral siphon moderate atherosclerosis, left more than right.  DVT negative.  EF 50 to 55%.  LDL 128, A1c 6.3.  Patient discharged with DAPT and Lipitor 40.  Hypertensive emergency Home meds: Lasix, Coreg 6.25 Stable, off Cleviprex Labetalol IV PRN On amlodipine 5->10 and Coreg 12.5 Long-term BP goal normotensive  Hyperlipidemia Home meds: Lipitor 40,  LDL 128, goal < 70 Consider statin at discharge  Dysphagia On tube feeding @ 50  Other Stroke Risk Factors CAD status post CABG  Other Active Problems Leukocytosis WBC 20.9 Anemia of chronic disease, hemoglobin 11.1->8.8->8.6  Hospital day # 3  This patient is critically ill due to large right BG ICH, SAH, IVH, cerebral edema, seizure, AKI on CKD and at significant risk of neurological worsening, death form brain herniation, renal failure, status epilepticus, sepsis. This patient's care requires constant monitoring of vital signs, hemodynamics, respiratory and cardiac monitoring, review of multiple databases, neurological assessment, discussion with family, other specialists and  medical decision making of high complexity. I spent 35 minutes of neurocritical care time in the care of this patient. I also discussed with CCM Dr. AgLynetta Mare  JiRosalin HawkingMD PhD Stroke Neurology 06/03/2021 2:00 PM   To contact Stroke  Continuity provider, please refer to [email or web address] After hours, contact General Neurology

## 2021-06-03 NOTE — Progress Notes (Signed)
PT Cancellation Note  Patient Details Name: Christina Nunez MRN: [id number] DOB: 05/19/1956   Cancelled Treatment:    Reason Eval/Treat Not Completed: Patient not medically ready - intubated, sedated, plan for extubation lateral today so will check back.  Stacie Glaze, PT DPT Acute Rehabilitation Services Pager [phone]  Office [phone]    Louis Matte 06/03/2021, 9:14 AM

## 2021-06-03 NOTE — Progress Notes (Signed)
OT Cancellation Note  Patient Details Name: Christina Nunez MRN: [id number] DOB: [birth year]   Cancelled Treatment:    Reason Eval/Treat Not Completed: Patient not medically ready. Pt is currently weaning with hopeful extubation later today. OT will continue to follow acutely and evaluate as schedule allows.   [place] Christina Nunez 06/03/2021, 9:05 AM  Jesse Sans OTR/L Acute Rehabilitation Services Pager: [phone] Office: [phone]

## 2021-06-03 NOTE — Progress Notes (Signed)
SLP Cancellation Note  Patient Details Name: Christina Nunez MRN: [id number] DOB: 21-Sep-1955   Cancelled treatment:        Reason eval/treat not completed: Pt not medically ready; orally intubated. SLP will continue to follow for pt readiness.   Dewitt Rota, SLP-Student   [place] Jairus Tonne 06/03/2021, 8:22 AM

## 2021-06-04 DIAGNOSIS — J9600 Acute respiratory failure, unspecified whether with hypoxia or hypercapnia: Secondary | ICD-10-CM | POA: Diagnosis not present

## 2021-06-04 DIAGNOSIS — I610 Nontraumatic intracerebral hemorrhage in hemisphere, subcortical: Secondary | ICD-10-CM | POA: Diagnosis not present

## 2021-06-04 DIAGNOSIS — J9601 Acute respiratory failure with hypoxia: Secondary | ICD-10-CM | POA: Diagnosis not present

## 2021-06-04 DIAGNOSIS — I609 Nontraumatic subarachnoid hemorrhage, unspecified: Secondary | ICD-10-CM | POA: Diagnosis not present

## 2021-06-04 DIAGNOSIS — Z978 Presence of other specified devices: Secondary | ICD-10-CM | POA: Diagnosis not present

## 2021-06-04 LAB — BASIC METABOLIC PANEL
Anion gap: 7 (ref 5–15)
BUN: 89 mg/dL — ABNORMAL HIGH (ref 8–23)
CO2: 18 mmol/L — ABNORMAL LOW (ref 22–32)
Calcium: 8.1 mg/dL — ABNORMAL LOW (ref 8.9–10.3)
Chloride: 130 mmol/L — ABNORMAL HIGH (ref 98–111)
Creatinine, Ser: 7.69 mg/dL — ABNORMAL HIGH (ref 0.44–1.00)
GFR, Estimated: 5 mL/min — ABNORMAL LOW (ref >60)
Glucose, Bld: 256 mg/dL — ABNORMAL HIGH (ref 70–99)
Potassium: 3.7 mmol/L (ref 3.5–5.1)
Sodium: 155 mmol/L — ABNORMAL HIGH (ref 135–145)

## 2021-06-04 LAB — CULTURE, RESPIRATORY W GRAM STAIN: Culture: NORMAL

## 2021-06-04 LAB — CBC
HCT: 28.4 % — ABNORMAL LOW (ref 36.0–46.0)
Hemoglobin: 9.1 g/dL — ABNORMAL LOW (ref 12.0–15.0)
MCH: 29.4 pg (ref 26.0–34.0)
MCHC: 32 g/dL (ref 30.0–36.0)
MCV: 91.9 fL (ref 80.0–100.0)
Platelets: 97 10*3/uL — ABNORMAL LOW (ref 150–400)
RBC: 3.09 MIL/uL — ABNORMAL LOW (ref 3.87–5.11)
RDW: 16 % — ABNORMAL HIGH (ref 11.5–15.5)
WBC: 11.6 10*3/uL — ABNORMAL HIGH (ref 4.0–10.5)
nRBC: 0.3 % — ABNORMAL HIGH (ref 0.0–0.2)

## 2021-06-04 LAB — GLUCOSE, CAPILLARY
Glucose-Capillary: 162 mg/dL — ABNORMAL HIGH (ref 70–99)
Glucose-Capillary: 190 mg/dL — ABNORMAL HIGH (ref 70–99)
Glucose-Capillary: 211 mg/dL — ABNORMAL HIGH (ref 70–99)
Glucose-Capillary: 219 mg/dL — ABNORMAL HIGH (ref 70–99)
Glucose-Capillary: 227 mg/dL — ABNORMAL HIGH (ref 70–99)
Glucose-Capillary: 231 mg/dL — ABNORMAL HIGH (ref 70–99)

## 2021-06-04 MED ORDER — NUTRISOURCE FIBER PO PACK
1.0000 | PACK | Freq: Two times a day (BID) | ORAL | Status: DC
  Administered 2021-06-04 – 2021-06-08 (×9): 1
  Filled 2021-06-04 (×10): qty 1

## 2021-06-04 MED ORDER — INSULIN DETEMIR 100 UNIT/ML ~~LOC~~ SOLN
20.0000 [IU] | Freq: Every day | SUBCUTANEOUS | Status: DC
  Administered 2021-06-04: 20 [IU] via SUBCUTANEOUS
  Filled 2021-06-04 (×3): qty 0.2

## 2021-06-04 MED ORDER — PROSOURCE TF PO LIQD
45.0000 mL | Freq: Two times a day (BID) | ORAL | Status: DC
  Administered 2021-06-04 – 2021-06-06 (×4): 45 mL
  Filled 2021-06-04 (×4): qty 45

## 2021-06-04 MED ORDER — FREE WATER
200.0000 mL | Status: DC
  Administered 2021-06-04 – 2021-06-05 (×5): 200 mL

## 2021-06-04 MED ORDER — JEVITY 1.5 CAL/FIBER PO LIQD
1000.0000 mL | ORAL | Status: DC
  Administered 2021-06-04 – 2021-06-06 (×2): 1000 mL
  Filled 2021-06-04 (×5): qty 1000

## 2021-06-04 NOTE — Progress Notes (Addendum)
STROKE TEAM PROGRESS NOTE   INTERVAL HISTORY No family is at bedside.  Patient neuro stable, unchanged. Still barely open eyes, right gaze preference, follows simple commands on the right. However, less urine output now and Cre continues to elevate. CCM Dr. Lynetta Mare plan to extubate in a couple days if stable.    Vitals:   06/04/21 0738 06/04/21 0800 06/04/21 0900 06/04/21 1000  BP:  (!) 163/81 (!) 163/84 (!) 163/79  Pulse: 86 76 74 80  Resp: '16 14 16 13  '$ Temp:  99.7 F (37.6 C)    TempSrc:  Axillary    SpO2: 100% 100% 100% 100%  Weight:      Height:       CBC:  Recent Labs  Lab 05/31/21 1600 05/31/21 1609 06/03/21 0652 06/04/21 0345  WBC 14.9*   < > 13.5* 11.6*  NEUTROABS 12.6*  --   --   --   HGB 13.6   < > 8.6* 9.1*  HCT 40.9   < > 27.3* 28.4*  MCV 88.0   < > 91.9 91.9  PLT 176   < > 93* 97*   < > = values in this interval not displayed.   Basic Metabolic Panel:  Recent Labs  Lab 05/31/21 1902 05/31/21 2101 06/02/21 0052 06/02/21 0956 06/03/21 0652 06/04/21 0345  NA 143   < > 162*   < > 152* 155*  K  --    < > 3.9  --  3.5 3.7  CL  --    < > >130*  --  129* 130*  CO2  --    < > 16*  --  17* 18*  GLUCOSE  --    < > 222*  --  229* 256*  BUN  --    < > 74*  --  82* 89*  CREATININE  --    < > 7.17*  --  7.38* 7.69*  CALCIUM  --    < > 8.1*  --  8.2* 8.1*  MG 2.2  --  2.0  --   --   --   PHOS 5.3*  --  4.8*  --   --   --    < > = values in this interval not displayed.   Lipid Panel:  Recent Labs  Lab 05/31/21 1902 06/01/21 0448  CHOL 156  --   TRIG 179* 184*  HDL 33*  --   CHOLHDL 4.7  --   VLDL 36  --   LDLCALC 87  --    HgbA1c:  Recent Labs  Lab 05/31/21 1902  HGBA1C 6.0*   Urine Drug Screen: No results for input(s): LABOPIA, COCAINSCRNUR, LABBENZ, AMPHETMU, THCU, LABBARB in the last 168 hours.  Alcohol Level  Recent Labs  Lab 05/31/21 1902  ETH <10    IMAGING past 24 hours No results found.  PHYSICAL EXAM  Temp:  [98.5 F (36.9  C)-99.7 F (37.6 C)] 99.7 F (37.6 C) (10/18 0800) Pulse Rate:  [66-86] 80 (10/18 1000) Resp:  [13-28] 13 (10/18 1000) BP: (127-163)/(68-86) 163/79 (10/18 1000) SpO2:  [97 %-100 %] 100 % (10/18 1000) FiO2 (%):  [40 %] 40 % (10/18 0738) Weight:  [78 kg] 78 kg (10/18 0500)  General - Well nourished, well developed, intubated off sedation.  Ophthalmologic - fundi not visualized due to noncooperation.  Cardiovascular - Regular rate and rhythm.  Neuro - intubated not on sedation, eyes closed, barely open on voice, eyes right gaze preference, can  have brief left gaze on request. Not blinking to visual threat bilaterally, right pupil 2.27m, left pupil 29m sluggish to light. Corneal reflex present, gag and cough present. Breathing over the vent.  Facial symmetry not able to test due to ET tube.  Tongue protrusion not cooperative. Follows simple commands on the right hand and foot, no movement of left UE and LE. RUE proximal 3/5 and finger grip 3/5. RLE 2+/5 proximal and toe DF/PF 3/5. DTR 1+ and no babinski. Sensation, coordination and gait not tested.    ASSESSMENT/PLAN [name] a [age]o female with a PMHx of mild dementia, CKD IV, pre DM, HLD, ischemic stroke, HTN, and depression admitted for unresponsiveness. Patient had seizure activity en route and was given Versed 2.'5mg'$  per EMS.   ICH - right BG large ICH with IVH and SAH, likely due to hypertensive emergency CTCooper large area of parenchymal hemorrhage centered within the right basal ganglia region. Subarachnoid extention also present with intraventricular extension (ICH 4). 70m37meftward midline shift.  CTA head & neck No AVM or aneurysm CT repeat stable hematoma, midline shift, no hydrocephalus MRI brain stable hematoma, SAH, IVH and midline shift 8 mm 2D Echo EF 60 to 65% EEG no seizure but encephalopathy LDL 128 HgbA1c 6.3 VTE prophylaxis - SCDs aspirin 81 mg daily and clopidogrel 75 mg daily prior to admission, now on No  antithrombotic given ICH Therapy recommendations: Pending Disposition: Pending  Cerebral edema CT and MRI showed stable midline shift 8 mm On 3% saline @ 75 -> FW 200 Q4h ->NS @ 50 Na 142-149-153-163-163-160->157->152->155 Na monitoring Urine osmolarity 468  Respiratory failure Intubated on ventilation Stable mental status CCM on board CCM consider to extubate in 2 days if stable  AKI on CKD  Cre 6.65->6.90->6.76->7.17->7.38->7.69 Baseline around 4 in 08/2020 Per daughter, patient deemed not a candidate for dialysis by her nephrologist Gentle hydration Continue to be oligouria.  CCM on board  Seizure Witnessed seizure with EMS Stat post Versed 2.5 mg Currently no clinical seizure EEG no seizure but encephalopathy Keppra 250 twice daily per renal adjustment  History of stroke 07/2020 admitted for right CR/SO lacunar infarct on MRI.  CT no acute abnormality.  MRI also showed old left cerebellum, left BG and thalamic, bilateral SO and CR infarcts.  MRI head and neck bilateral siphon moderate atherosclerosis, left more than right.  DVT negative.  EF 50 to 55%.  LDL 128, A1c 6.3.  Patient discharged with DAPT and Lipitor 40.  Hypertensive emergency Home meds: Lasix, Coreg 6.25 Stable, off Cleviprex Labetalol IV PRN On amlodipine 5->10 and Coreg 12.5 Long-term BP goal normotensive  Hyperlipidemia Home meds: Lipitor 40,  LDL 128, goal < 70 Consider statin at discharge  Dysphagia On tube feeding @ 50  Other Stroke Risk Factors CAD status post CABG  Other Active Problems Leukocytosis WBC 20.9->11.6 Anemia of chronic disease, hemoglobin 11.1->8.8->8.6->9.1  Hospital day # 4  This patient is critically ill due to large right BG ICH, SAH, IVH, cerebral edema, seizure, AKI on CKD and at significant risk of neurological worsening, death form brain herniation, renal failure, status epilepticus, sepsis. This patient's care requires constant monitoring of vital signs,  hemodynamics, respiratory and cardiac monitoring, review of multiple databases, neurological assessment, discussion with family, other specialists and medical decision making of high complexity. I spent 35 minutes of neurocritical care time in the care of this patient. I discussed with CCM Dr. AgaLynetta Mare JinRosalin HawkingD PhD Stroke Neurology  06/04/2021 11:15 AM   To contact Stroke Continuity provider, please refer to [email or web address] After hours, contact General Neurology

## 2021-06-04 NOTE — Progress Notes (Signed)
SLP Cancellation Note  Patient Details Name: Christina Nunez MRN: [id number] DOB: [birth year]   Cancelled treatment:       Reason Eval/Treat Not Completed: Medical issues which prohibited therapy; remains on the ventilator; will continue to follow.    [place], CCC-SLP Acute Rehabilitation Services   06/04/2021, 10:49 AM

## 2021-06-04 NOTE — Progress Notes (Signed)
OT Cancellation Note  Patient Details Name: Christina Nunez MRN: [id number] DOB: 1956/05/06   Cancelled Treatment:    Reason Eval/Treat Not Completed: Other (comment) Pt intubated, barely opens eyes. Will follow up to assess readiness tomorrow.  Ramond Dial, OT/L   Acute OT Clinical Specialist Acute Rehabilitation Services Pager [phone] Office [phone]  06/04/2021, 7:58 AM

## 2021-06-04 NOTE — Progress Notes (Addendum)
Nutrition Follow-up  DOCUMENTATION CODES:   Not applicable  INTERVENTION:   Tube feeding via OG tube: Transition to Jevity 1.5 @ 50 mL/hr (1200 mL/day) 45 mL ProSource TF Daily 200 mL free water flush q4h  Provides 1840 kcal, 88 gm PRO, 912 mL free water daily (2112 mL total free water)  NUTRITION DIAGNOSIS:   Inadequate oral intake related to inability to eat as evidenced by NPO status. - Progressing, pt on TF  GOAL:   Patient will meet greater than or equal to 90% of their needs - Progressing, pt needs being met via TF  MONITOR:   Vent status, Weight trends, Labs, I & O's  REASON FOR ASSESSMENT:   Consult, Ventilator Enteral/tube feeding initiation and management  ASSESSMENT:   Pt with a PMH significant for ischemic stroke, HTN, HLD, CKD IV, mild dementia, pre-DM admitted with large R intracerebral hemorrhage in R basal ganglia w/ 8cm leftward shift, cerebral edema, and hypertensive emergency.  10/14: Intubated  No family present in room to obtain nutrition related information. Tube feed of Vital 1.5 hanging and running at 50 mL/hr.   Pt discussed during round. Tolerating weaning, following commands, but unable to open eyes. Per nurse, pt still having loose stool via rectal pouch that is sitting.  Per Neurology note, per pt daughter's she is not a candidate for dialysis.  Patient is currently intubated on ventilator support. MV: 10.2 L/min Temp (24hrs), Avg:98.9 F (37.2 C), Min:98.5 F (36.9 C), Max:99.7 F (37.6 C)  Medications reviewed and include: Augmentin, Colace, Fiber, SSI 0-6 units q4h, Levemir 20 units, Protonix, Miralax, Senna-Docusate Labs reviewed: Sodium 155, BUN 89, Creatinine 7.69, 24 hr BG trends 217-249, Hgb A1c 6%  Admission Weight: 70 kg Current Weight: 78 kg  UOP: 360 mL x 24 hr I/O's: +7015 mL since admit  NUTRITION - FOCUSED PHYSICAL EXAM:  Flowsheet Row Most Recent Value  Orbital Region No depletion  Upper Arm Region No  depletion  Thoracic and Lumbar Region No depletion  Buccal Region Unable to assess  Temple Region Mild depletion  Clavicle Bone Region No depletion  Clavicle and Acromion Bone Region No depletion  Scapular Bone Region No depletion  Dorsal Hand No depletion  Patellar Region Mild depletion  Anterior Thigh Region Mild depletion  Posterior Calf Region Mild depletion  Edema (RD Assessment) None  Hair Unable to assess  [Hair Cap]  Eyes Unable to assess  [Can't open]  Mouth Unable to assess  [Intubated]  Skin Reviewed  Nails Reviewed  [R hand in mits]       Diet Order:   Diet Order             Diet NPO time specified  Diet effective now                   EDUCATION NEEDS:   Not appropriate for education at this time  Skin:  Skin Assessment: Reviewed RN Assessment  Last BM:  06/04/2021  Height:   Ht Readings from Last 1 Encounters:  05/31/21 5' 3"  (1.6 m)    Weight:   Wt Readings from Last 1 Encounters:  06/04/21 78 kg    Ideal Body Weight:  52.3 kg  BMI:  Body mass index is 30.46 kg/m.  Estimated Nutritional Needs:   Kcal:  8676-1950  Protein:  85-100 grams  Fluid:  >1.8L/day    Roxana Hires, PLDN Clinical Dietitian See AMiON for contact information.

## 2021-06-04 NOTE — Progress Notes (Signed)
PT Cancellation Note  Patient Details Name: Christina Nunez MRN: [id number] DOB: [birth year]   Cancelled Treatment:    Reason Eval/Treat Not Completed: Medical issues which prohibited therapy (pt currently weaning). Will check back later today.   Leighton Roach, [place]  Pager [phone] Office [place] 06/04/2021, 11:17 AM

## 2021-06-04 NOTE — Progress Notes (Signed)
NAME:  Christina Nunez, MRN:  [id number], DOB:  [birth year], LOS: 4 ADMISSION DATE:  05/31/2021, CONSULTATION DATE: 10/14 REFERRING MD: Dr. Theda Sers, CHIEF COMPLAINT: Stroke  History of Present Illness:  Patient is a 65 year old female with PMH of ischemic stroke, HTN, HLD, CKD IV, mild dementia, pre-DM presents to [facility] ED on 10/14 for unresponsiveness.  Patient's last known normal at 7:50 AM on 10/14.  [place] RN was unable to get into house today, family called and patient was found unresponsive next to bed.  EMS arrived and patient had seizure like activity in route to [facility] ED.  2.5 mg of Versed given.  Upon arrival to [facility] ED 10/14, patient was intubated for airway protection.  BP was high.  CT head: Large parenchymal hemorrhage within right basal ganglia region and surrounding white matter; mass-effect with 8 mm leftward midline shift without ventricle entrapment. Neuro consulted.  Hypertonic saline started. Started on Cleviprex. Patient admitted to ICU.  PCCM consulted for management of vent while intubated and medical management.  Pertinent  Medical History   Past Medical History:  Diagnosis Date   Hypertension    Stroke ([place])    Significant Hospital Events: Including procedures, antibiotic start and stop dates in addition to other pertinent events   10/14: adm with RT ICH with IVH & SAH seizure per EMS ,on cleviprex gtt 10/14 cardiology consulted for high troponin 18 K 10/15 MRI stable hematoma, SAH, IVH with midline shift 8 mm 10/16 EEG shows no evidence of seizure activity.  Interim History / Subjective:   Remains intubated.  Follows commands predictably.  Minimal secretions.  Objective   Blood pressure (!) 163/78, pulse 86, temperature 98.7 F (37.1 C), temperature source Axillary, resp. rate 16, height '5\' 3"'$  (1.6 m), weight 78 kg, SpO2 100 %.    Vent Mode: PSV;CPAP FiO2 (%):  [40 %] 40 % Set Rate:  [18 bmp] 18 bmp Vt Set:  [450 mL] 450 mL PEEP:  [5 cmH20] 5 cmH20 Pressure  Support:  [8 cmH20-10 cmH20] 10 cmH20 Plateau Pressure:  [10 cmH20-14 cmH20] 14 cmH20   Intake/Output Summary (Last 24 hours) at 06/04/2021 0806 Last data filed at 06/04/2021 0700 Gross per 24 hour  Intake 2367.95 ml  Output 415 ml  Net 1952.95 ml    Filed Weights   05/31/21 1600 06/02/21 0500 06/04/21 0500  Weight: 70 kg 75.3 kg 78 kg    Examination: General:  critically ill appearing on mech vent HEENT: MM pink/moist; ETT in place Neuro: Follows one-step commands with good strength on the right side.  No response to pain on the left.  Able to swallow to command.  Nods appropriately to questions. CV: s1s2, RRR, no m/r/g; on cleviprex PULM: Clear breath sounds bilateral, no accessory muscle use GI: soft, bsx4 active  Extremities: warm/dry, no edema  Skin: no rashes or lesions   Labs: hypernatremia.  Creatinine remains elevated  Resolved Hospital Problem list     Assessment & Plan:  Critically ill due to acute respiratory failure with hypoxia and hypercarbia related to large R intracerebral hemorrhage in R basal ganglia w/ 8 mm leftward shift from cerebral edema. Hypertensive urgency Hypernatremia, iatrogenic and secondary to insensitive losses.  AKI on CKD IV , rising creatinine Deemed not a candidate for dialysis by her outpatient nephrology Possible aspiration pneumonia Type 2 diabetes with hyperglycemia Hx of depression  Baseline function is poor and patient is not a candidate for long-term hemodialysis.  Best hope would be to tolerate one-way trial  of extubation.  Plan:  -Continue daily SBT.  Failed today due to low respiratory rate.  Still not through cerebral edema window, so may still improve further over the next few days.  Reasonable for trial of extubation now post bleed day 5. -Free water to correct hypernatremia. May help mentation further.  -Keep off all sedative infusions. -Continue current enteral antihypertensive regimen -Continue Keppra for prior  seizure activity -Complete 5 days of Augmentin for possible aspiration  Best Practice (right click and "Reselect all SmartList Selections" daily)   Diet/type: tubefeeds and NPO w/ meds via tube DVT prophylaxis: SCD GI prophylaxis: PPI Lines: N/A Foley:  Yes, and it is still needed Code Status:  full code Last date of multidisciplinary goals of care discussion [per primary]  Labs   CBC: Recent Labs  Lab 05/31/21 1600 05/31/21 1609 05/31/21 1819 06/01/21 0448 06/02/21 0052 06/03/21 0652 06/04/21 0345  WBC 14.9*  --   --  20.9* 14.0* 13.5* 11.6*  NEUTROABS 12.6*  --   --   --   --   --   --   HGB 13.6   < > 11.2* 11.1* 8.8* 8.6* 9.1*  HCT 40.9   < > 33.0* 33.3* 28.7* 27.3* 28.4*  MCV 88.0  --   --  88.1 92.9 91.9 91.9  PLT 176  --   --  141* 94* 93* 97*   < > = values in this interval not displayed.     Basic Metabolic Panel: Recent Labs  Lab 05/31/21 1600 05/31/21 1609 05/31/21 1819 05/31/21 1902 05/31/21 2101 06/01/21 0448 06/01/21 1458 06/02/21 0052 06/02/21 0956 06/02/21 1700 06/02/21 2218 06/03/21 0652 06/04/21 0345  NA 142 144 149* 143   < > 153*   < > 162* 160* 157* 159* 152* 155*  K 3.8 3.9 3.5  --   --  3.9  --  3.9  --   --   --  3.5 3.7  CL 112* 115*  --   --   --  126*  --  >130*  --   --   --  129* 130*  CO2 14*  --   --   --   --  14*  --  16*  --   --   --  17* 18*  GLUCOSE 193* 188*  --   --   --  214*  --  222*  --   --   --  229* 256*  BUN 59* 56*  --   --   --  63*  --  74*  --   --   --  82* 89*  CREATININE 6.65* 6.90*  --   --   --  6.76*  --  7.17*  --   --   --  7.38* 7.69*  CALCIUM 9.1  --   --   --   --  8.3*  --  8.1*  --   --   --  8.2* 8.1*  MG  --   --   --  2.2  --   --   --  2.0  --   --   --   --   --   PHOS  --   --   --  5.3*  --   --   --  4.8*  --   --   --   --   --    < > = values in this interval not displayed.  GFR: Estimated Creatinine Clearance: 7.3 mL/min (A) (by C-G formula based on SCr of 7.69 mg/dL  (H)). Recent Labs  Lab 05/31/21 1902 06/01/21 0448 06/02/21 0052 06/03/21 0652 06/04/21 0345  WBC  --  20.9* 14.0* 13.5* 11.6*  LATICACIDVEN 3.1* 1.8  --   --   --      Liver Function Tests: Recent Labs  Lab 05/31/21 1600  AST 61*  ALT 25  ALKPHOS 68  BILITOT 0.8  PROT 6.8  ALBUMIN 3.2*    No results for input(s): LIPASE, AMYLASE in the last 168 hours. No results for input(s): AMMONIA in the last 168 hours.  ABG    Component Value Date/Time   PHART 7.321 (L) 05/31/2021 1819   PCO2ART 30.5 (L) 05/31/2021 1819   PO2ART 95 05/31/2021 1819   HCO3 15.7 (L) 05/31/2021 1819   TCO2 17 (L) 05/31/2021 1819   ACIDBASEDEF 9.0 (H) 05/31/2021 1819   O2SAT 97.0 05/31/2021 1819      Coagulation Profile: Recent Labs  Lab 05/31/21 1600  INR 1.1     Cardiac Enzymes: No results for input(s): CKTOTAL, CKMB, CKMBINDEX, TROPONINI in the last 168 hours.  HbA1C: Hgb A1c MFr Bld  Date/Time Value Ref Range Status  05/31/2021 07:02 PM 6.0 (H) 4.8 - 5.6 % Final    Comment:    (NOTE) Pre diabetes:          5.7%-6.4%  Diabetes:              >6.4%  Glycemic control for   <7.0% adults with diabetes   08/13/2020 04:18 PM 6.3 (H) 4.8 - 5.6 % Final    Comment:    (NOTE) Pre diabetes:          5.7%-6.4%  Diabetes:              >6.4%  Glycemic control for   <7.0% adults with diabetes     CBG: Recent Labs  Lab 06/03/21 1153 06/03/21 1545 06/03/21 1957 06/03/21 2339 06/04/21 0341  GLUCAP 244* 242* 249* 237* 219*    CRITICAL CARE Performed by: Kipp Brood   Total critical care time: 35 minutes  Critical care time was exclusive of separately billable procedures and treating other patients.  Critical care was necessary to treat or prevent imminent or life-threatening deterioration.  Critical care was time spent personally by me on the following activities: development of treatment plan with patient and/or surrogate as well as nursing, discussions with  consultants, evaluation of patient's response to treatment, examination of patient, obtaining history from patient or surrogate, ordering and performing treatments and interventions, ordering and review of laboratory studies, ordering and review of radiographic studies, pulse oximetry, re-evaluation of patient's condition and participation in multidisciplinary rounds.  Kipp Brood, MD [facility] ICU Physician [place]  Pager: [phone] Mobile: [phone] After hours: [phone].

## 2021-06-04 NOTE — TOC CAGE-AID Note (Signed)
Transition of Care [facility]) - CAGE-AID Screening   Patient Details  Name: Christina Nunez MRN: [id number] Date of Birth: Nov 06, 1955  [facility]) CM/SW Contact:    Ellory Khurana C Tarpley-Carter, [place] Phone Number: 06/04/2021, 10:24 AM   Clinical Narrative: Pt is unable to participate in Cage Aid.  Hajar Penninger Tarpley-Carter, MSW, LCSW-A Pronouns:  She/Her/Hers [facility] HealthTransitions of Care Clinical Social Worker Direct Number:  [phone] [email or web address]   CAGE-AID Screening: Substance Abuse Screening unable to be completed due to: : Patient unable to participate             Substance Abuse Education Offered: No

## 2021-06-04 NOTE — Evaluation (Signed)
Physical Therapy Evaluation Patient Details Name: Christina Nunez MRN: [id number] DOB: 04/11/1956 Today's Date: 06/04/2021  History of Present Illness  Pt is 65 yo female who presents after being found unresponsive with large R ICH, SAH, cerebral edema, seizure, AKI on CKD. PMH:  HTN, mild dementia, depression/anxiety, and likely CABG (sternotomy wires present on CXR), L CVA in 2021, CKD, pre DM, HLD.  Clinical Impression  Pt admitted with above diagnosis. Pt presents from home, intubated with eyes closed throughout session. Despite this, she nods head yes and no to simple questions and attempts to follow 25% of commands. R head turn noted throughout session, able to bring to midline when cued but not to L. No active motion LLE noted during bed mobility or when bed placed in chair position. Recommend prevalon boot for LLE. Pt currently with functional limitations due to the deficits listed below (see PT Problem List). Pt will benefit from skilled PT to increase their independence and safety with mobility to allow discharge to the venue listed below.          Recommendations for follow up therapy are one component of a multi-disciplinary discharge planning process, led by the attending physician.  Recommendations may be updated based on patient status, additional functional criteria and insurance authorization.  Follow Up Recommendations CIR;Supervision/Assistance - 24 hour    Equipment Recommendations  Other (comment) (TBD)    Recommendations for Other Services Rehab consult;OT consult     Precautions / Restrictions Precautions Precautions: Fall Restrictions Weight Bearing Restrictions: No      Mobility  Bed Mobility Overal bed mobility: Needs Assistance Bed Mobility: Rolling Rolling: Max assist;+2 for physical assistance         General bed mobility comments: rolled R and L for positioning, pt required max A +2 to obtain SL position    Transfers                  General transfer comment: bed placed in chair position, pt more actively using RUE and nodding answers to questions. Copious drooling R side of mouth > L  Ambulation/Gait                Stairs            Wheelchair Mobility    Modified Rankin (Stroke Patients Only) Modified Rankin (Stroke Patients Only) Pre-Morbid Rankin Score: Moderate disability Modified Rankin: Severe disability     Balance Overall balance assessment: Needs assistance     Sitting balance - Comments: needed min assist maintaining midline when sitting with bed in chair position                                     Pertinent Vitals/Pain Pain Assessment: Faces Faces Pain Scale: Hurts little more Pain Location: head Pain Descriptors / Indicators: Headache Pain Intervention(s): Limited activity within patient's tolerance;Monitored during session    Home Living Family/patient expects to be discharged to:: Private residence Living Arrangements: Other relatives Available Help at Discharge: Family;Available PRN/intermittently Type of Home: Apartment Home Access: Stairs to enter     Home Layout: Two level;Bed/bath upstairs   Additional Comments: info from chart review but pt was able to confirm that she still lives with her sister in an apt.    Prior Function Level of Independence: Needs assistance               Hand Dominance   Dominant Hand:  Right    Extremity/Trunk Assessment   Upper Extremity Assessment Upper Extremity Assessment: Defer to OT evaluation    Lower Extremity Assessment Lower Extremity Assessment: LLE deficits/detail;RLE deficits/detail RLE Deficits / Details: pt moving RLE actively in bed RLE Sensation: WNL RLE Coordination: WNL LLE Deficits / Details: no active mvmt LLE noted on eval, full PROM    Cervical / Trunk Assessment Cervical / Trunk Assessment: Other exceptions Cervical / Trunk Exceptions: maintains R cervical rotation, able to turn  head to midline when cued, able to turn head to L passively  Communication   Communication: Other (comment) (intubated)  Cognition Arousal/Alertness: Awake/alert Behavior During Therapy: Flat affect Overall Cognitive Status: Difficult to assess                                 General Comments: pt keeps eyes closed throughout but responds to stimulation and nods head yes and no appropriately to simple questions      General Comments General comments (skin integrity, edema, etc.): pt intubated with 40% conc, PEEP 5, SPO2 100%    Exercises Other Exercises Other Exercises: gentle passive cervical ROM   Assessment/Plan    PT Assessment Patient needs continued PT services  PT Problem List Decreased strength;Decreased range of motion;Decreased activity tolerance;Decreased balance;Decreased mobility;Decreased coordination;Decreased cognition;Decreased knowledge of use of DME;Decreased safety awareness;Decreased knowledge of precautions;Cardiopulmonary status limiting activity;Pain;Impaired sensation;Impaired tone       PT Treatment Interventions DME instruction;Gait training;Functional mobility training;Therapeutic activities;Therapeutic exercise;Balance training;Neuromuscular re-education;Cognitive remediation;Patient/family education    PT Goals (Current goals can be found in the Care Plan section)  Acute Rehab PT Goals Patient Stated Goal: pt unable to state PT Goal Formulation: Patient unable to participate in goal setting Time For Goal Achievement: 06/18/21 Potential to Achieve Goals: Fair    Frequency Min 4X/week   Barriers to discharge        Co-evaluation               AM-PAC PT "6 Clicks" Mobility  Outcome Measure Help needed turning from your back to your side while in a flat bed without using bedrails?: Total Help needed moving from lying on your back to sitting on the side of a flat bed without using bedrails?: Total Help needed moving to and from  a bed to a chair (including a wheelchair)?: Total Help needed standing up from a chair using your arms (e.g., wheelchair or bedside chair)?: Total Help needed to walk in hospital room?: Total Help needed climbing 3-5 steps with a railing? : Total 6 Click Score: 6    End of Session Equipment Utilized During Treatment: Oxygen Activity Tolerance: Patient tolerated treatment well Patient left: in bed;with call bell/phone within reach;with bed alarm set;with nursing/sitter in room Nurse Communication: Mobility status PT Visit Diagnosis: Muscle weakness (generalized) (M62.81);Hemiplegia and hemiparesis;Pain;Difficulty in walking, not elsewhere classified (R26.2) Hemiplegia - Right/Left: Left Hemiplegia - dominant/non-dominant: Non-dominant Hemiplegia - caused by: Other Nontraumatic intracranial hemorrhage Pain - part of body:  (head)    Time: [id number] PT Time Calculation (min) (ACUTE ONLY): 22 min   Charges:   PT Evaluation $PT Eval Moderate Complexity: [place]  Pager [phone] Office [place] 06/04/2021, 4:51 PM

## 2021-06-04 NOTE — Progress Notes (Signed)
Inpatient Rehab Admissions Coordinator:   Per therapy recommendations, patient was screened for CIR candidacy by Clemens Catholic, MS, CCC-SLP. At this time, Pt. is not attempting OOB yet and I do not think she is able to tolerate intensity of CIR. However,   Pt. may have potential to progress to becoming a potential CIR candidate, so CIR admissions team will follow and monitor for progress and participation with therapies and place consult order if Pt. appears to be an appropriate candidate. Please contact me with any questions.    Clemens Catholic, [place], [place] Admissions Coordinator  [phone] ([place]) [phone] (office)

## 2021-06-05 ENCOUNTER — Inpatient Hospital Stay (HOSPITAL_COMMUNITY): Payer: Medicaid Other

## 2021-06-05 DIAGNOSIS — I609 Nontraumatic subarachnoid hemorrhage, unspecified: Secondary | ICD-10-CM | POA: Diagnosis not present

## 2021-06-05 DIAGNOSIS — I610 Nontraumatic intracerebral hemorrhage in hemisphere, subcortical: Secondary | ICD-10-CM | POA: Diagnosis not present

## 2021-06-05 DIAGNOSIS — J9601 Acute respiratory failure with hypoxia: Secondary | ICD-10-CM | POA: Diagnosis not present

## 2021-06-05 DIAGNOSIS — I615 Nontraumatic intracerebral hemorrhage, intraventricular: Secondary | ICD-10-CM | POA: Diagnosis not present

## 2021-06-05 LAB — CBC
HCT: 30.3 % — ABNORMAL LOW (ref 36.0–46.0)
Hemoglobin: 9.6 g/dL — ABNORMAL LOW (ref 12.0–15.0)
MCH: 29.1 pg (ref 26.0–34.0)
MCHC: 31.7 g/dL (ref 30.0–36.0)
MCV: 91.8 fL (ref 80.0–100.0)
Platelets: 94 10*3/uL — ABNORMAL LOW (ref 150–400)
RBC: 3.3 MIL/uL — ABNORMAL LOW (ref 3.87–5.11)
RDW: 16 % — ABNORMAL HIGH (ref 11.5–15.5)
WBC: 12.6 10*3/uL — ABNORMAL HIGH (ref 4.0–10.5)
nRBC: 0.2 % (ref 0.0–0.2)

## 2021-06-05 LAB — BASIC METABOLIC PANEL
Anion gap: 9 (ref 5–15)
BUN: 96 mg/dL — ABNORMAL HIGH (ref 8–23)
CO2: 14 mmol/L — ABNORMAL LOW (ref 22–32)
Calcium: 7.8 mg/dL — ABNORMAL LOW (ref 8.9–10.3)
Chloride: 127 mmol/L — ABNORMAL HIGH (ref 98–111)
Creatinine, Ser: 7.56 mg/dL — ABNORMAL HIGH (ref 0.44–1.00)
GFR, Estimated: 6 mL/min — ABNORMAL LOW (ref >60)
Glucose, Bld: 209 mg/dL — ABNORMAL HIGH (ref 70–99)
Potassium: 4.2 mmol/L (ref 3.5–5.1)
Sodium: 150 mmol/L — ABNORMAL HIGH (ref 135–145)

## 2021-06-05 LAB — GLUCOSE, CAPILLARY
Glucose-Capillary: 142 mg/dL — ABNORMAL HIGH (ref 70–99)
Glucose-Capillary: 149 mg/dL — ABNORMAL HIGH (ref 70–99)
Glucose-Capillary: 151 mg/dL — ABNORMAL HIGH (ref 70–99)
Glucose-Capillary: 156 mg/dL — ABNORMAL HIGH (ref 70–99)
Glucose-Capillary: 181 mg/dL — ABNORMAL HIGH (ref 70–99)
Glucose-Capillary: 242 mg/dL — ABNORMAL HIGH (ref 70–99)

## 2021-06-05 IMAGING — DX DG ABD PORTABLE 1V
1 series · 1 of 1 positions shown · non-contrast
Comparison: [DATE]

CLINICAL DATA: Feeding tube placement

EXAM:
PORTABLE ABDOMEN - 1 VIEW

[abdomen kub]
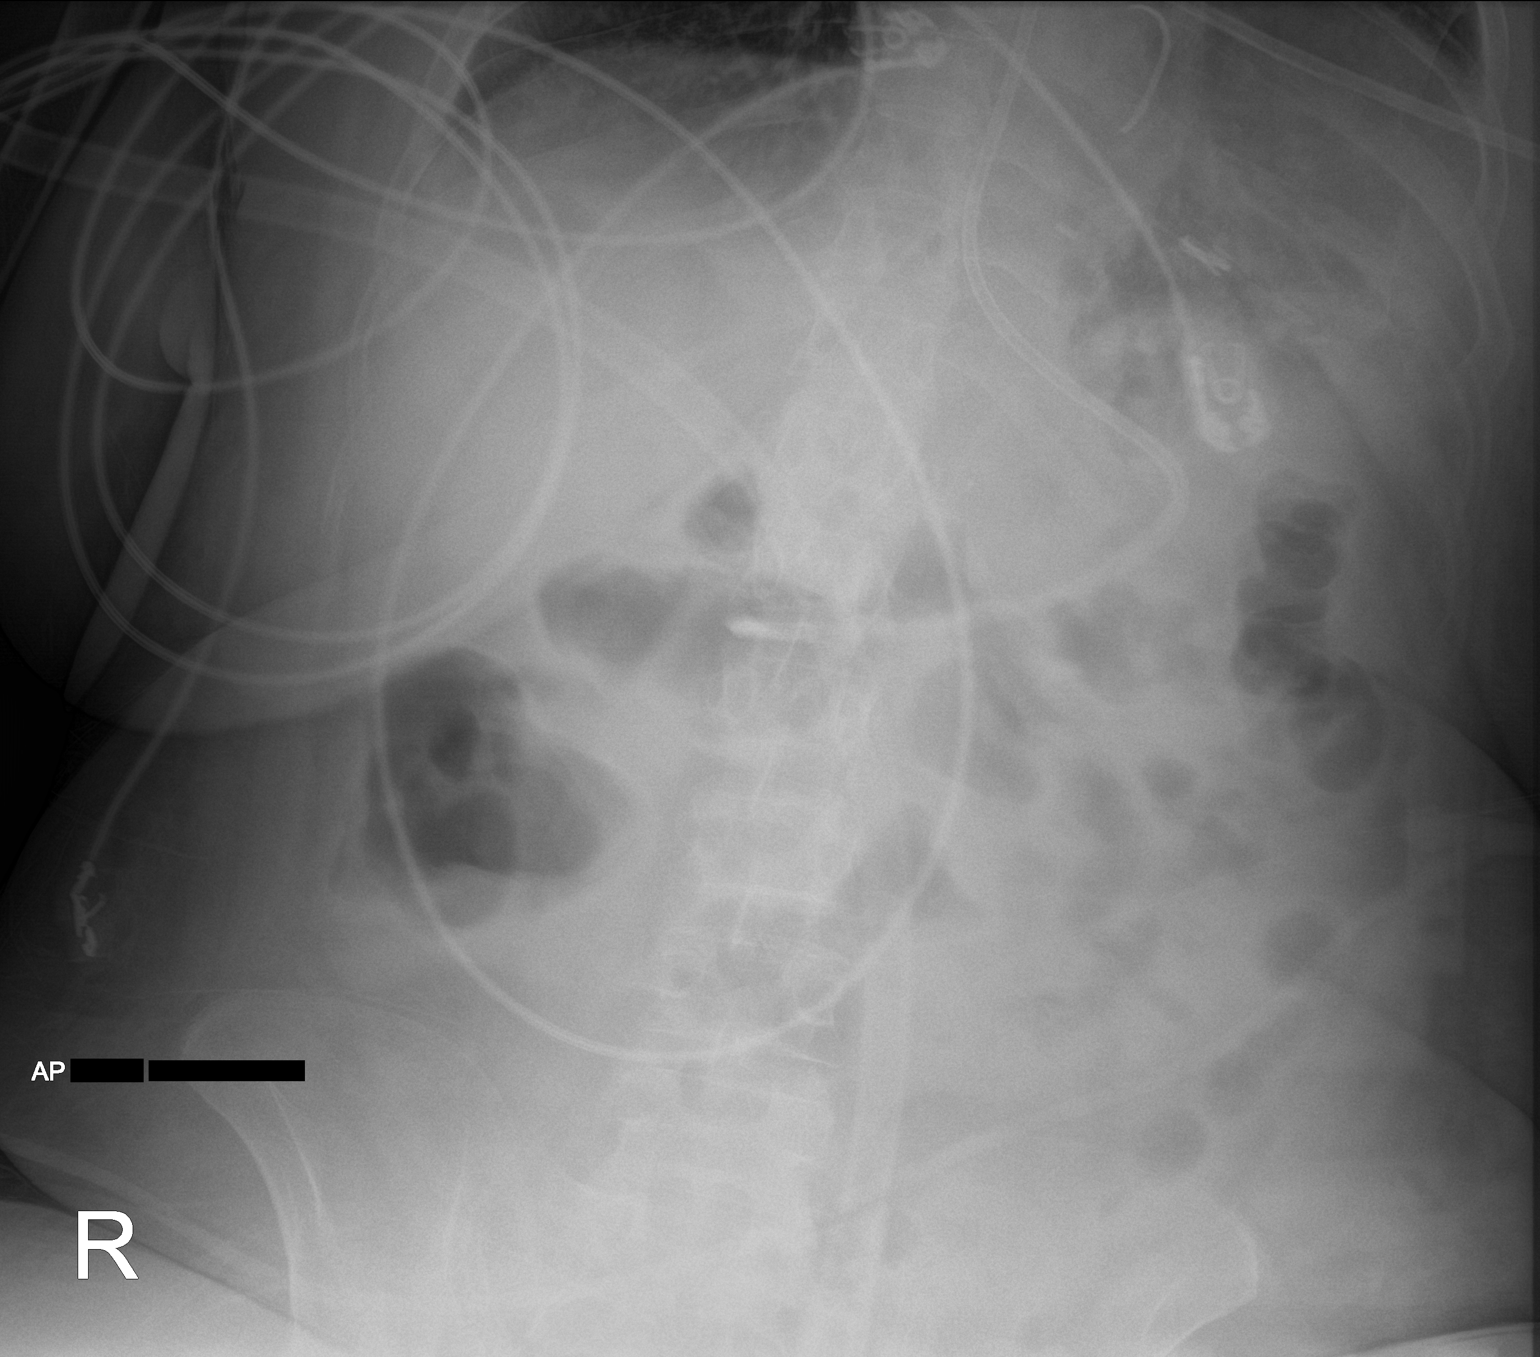

[1 of 1 positions shown; findings below may reference images not displayed]

FINDINGS: Feeding tube with weighted tip has been advanced to the gastric
antrum. The stomach is decompressed. Visualized bowel gas pattern is
normal. The lower abdomen is excluded.
IMPRESSION: Feeding tube to the gastric antrum

## 2021-06-05 MED ORDER — ORAL CARE MOUTH RINSE
15.0000 mL | Freq: Two times a day (BID) | OROMUCOSAL | Status: DC
Start: 2021-06-06 — End: 2021-06-08
  Administered 2021-06-06 – 2021-06-07 (×4): 15 mL via OROMUCOSAL

## 2021-06-05 MED ORDER — ROCURONIUM BROMIDE 10 MG/ML (PF) SYRINGE
PREFILLED_SYRINGE | INTRAVENOUS | Status: AC
  Filled 2021-06-05: qty 10

## 2021-06-05 MED ORDER — DEXAMETHASONE SODIUM PHOSPHATE 10 MG/ML IJ SOLN
INTRAMUSCULAR | Status: AC
  Administered 2021-06-05: 10 mg via INTRAVENOUS
  Filled 2021-06-05: qty 1

## 2021-06-05 MED ORDER — FREE WATER: 200.0000 mL | Status: DC

## 2021-06-05 MED ORDER — TORSEMIDE 20 MG PO TABS
40.0000 mg | ORAL_TABLET | Freq: Every day | ORAL | Status: DC
  Administered 2021-06-05 – 2021-06-08 (×4): 40 mg
  Filled 2021-06-05 (×5): qty 2

## 2021-06-05 MED ORDER — DEXAMETHASONE SODIUM PHOSPHATE 10 MG/ML IJ SOLN: 10.0000 mg | Freq: Once | INTRAMUSCULAR | Status: AC

## 2021-06-05 MED ORDER — CHLORHEXIDINE GLUCONATE 0.12 % MT SOLN
15.0000 mL | Freq: Two times a day (BID) | OROMUCOSAL | Status: DC
  Administered 2021-06-05 – 2021-06-10 (×10): 15 mL via OROMUCOSAL
  Filled 2021-06-05 (×7): qty 15

## 2021-06-05 MED ORDER — ETOMIDATE 2 MG/ML IV SOLN
INTRAVENOUS | Status: AC
  Filled 2021-06-05: qty 20

## 2021-06-05 MED ORDER — MIDAZOLAM HCL 2 MG/2ML IJ SOLN
INTRAMUSCULAR | Status: AC
  Filled 2021-06-05: qty 2

## 2021-06-05 MED ORDER — KETAMINE HCL 50 MG/5ML IJ SOSY
PREFILLED_SYRINGE | INTRAMUSCULAR | Status: AC
  Filled 2021-06-05: qty 5

## 2021-06-05 MED ORDER — INSULIN DETEMIR 100 UNIT/ML ~~LOC~~ SOLN
30.0000 [IU] | Freq: Every day | SUBCUTANEOUS | Status: DC
  Administered 2021-06-05 – 2021-06-07 (×3): 30 [IU] via SUBCUTANEOUS
  Filled 2021-06-05 (×4): qty 0.3

## 2021-06-05 MED ORDER — GLYCOPYRROLATE 0.2 MG/ML IJ SOLN
0.1000 mg | Freq: Once | INTRAMUSCULAR | Status: AC
  Administered 2021-06-05: 0.1 mg via INTRAVENOUS
  Filled 2021-06-05: qty 1

## 2021-06-05 MED ORDER — FREE WATER
200.0000 mL | Status: DC
  Administered 2021-06-05 – 2021-06-08 (×15): 200 mL

## 2021-06-05 MED ORDER — OXYCODONE HCL 5 MG PO TABS
10.0000 mg | ORAL_TABLET | ORAL | Status: DC | PRN
  Administered 2021-06-05 – 2021-06-06 (×2): 10 mg via NASOGASTRIC
  Filled 2021-06-05 (×2): qty 2

## 2021-06-05 MED ORDER — FENTANYL CITRATE PF 50 MCG/ML IJ SOSY
PREFILLED_SYRINGE | INTRAMUSCULAR | Status: AC
  Filled 2021-06-05: qty 2

## 2021-06-05 NOTE — Progress Notes (Signed)
PT Cancellation Note  Patient Details Name: Christina Nunez MRN: [id number] DOB: 07-17-1956   Cancelled Treatment:    Reason Eval/Treat Not Completed: Patient not medically ready Pt recently extubated and RN concerned about her respiratory status at this time. Will follow up at a later time.   Aldrick Derrig A. Gilford Rile PT, DPT Acute Rehabilitation Services Pager [phone] Office [phone]    Linna Hoff 06/05/2021, 12:16 PM

## 2021-06-05 NOTE — Procedures (Signed)
Cortrak  Person Inserting Tube:  Christina Nunez, Christina Nunez, RD Tube Type:  Cortrak - 43 inches Tube Size:  10 Tube Location:  Right nare Initial Placement:  Stomach Secured by: Bridle Technique Used to Measure Tube Placement:  Marking at nare/corner of mouth Cortrak Secured At:  63 cm  Cortrak Tube Team Note:  Consult received to place a Cortrak feeding tube.   X-ray is required, abdominal x-ray has been ordered by the Cortrak team. Please confirm tube placement before using the Cortrak tube.   If the tube becomes dislodged please keep the tube and contact the Cortrak team at [email or web address] ([email or web address]) for replacement.  If after hours and replacement cannot be delayed, place a NG tube and confirm placement with an abdominal x-ray.    Larkin Ina, MS, RD, LDN (she/her/hers) RD pager number and weekend/on-call pager number located in [place].

## 2021-06-05 NOTE — Progress Notes (Signed)
NAME:  Christina Nunez, MRN:  [id number], DOB:  1956/01/22, LOS: 5 ADMISSION DATE:  05/31/2021, CONSULTATION DATE: 10/14 REFERRING MD: Dr. Theda Sers, CHIEF COMPLAINT: Stroke  History of Present Illness:  Patient is a 65 year old female with PMH of ischemic stroke, HTN, HLD, CKD IV, mild dementia, pre-DM presents to [facility] ED on 10/14 for unresponsiveness.  Patient's last known normal at 7:50 AM on 10/14.  [place] RN was unable to get into house today, family called and patient was found unresponsive next to bed.  EMS arrived and patient had seizure like activity in route to [facility] ED.  2.5 mg of Versed given.  Upon arrival to [facility] ED 10/14, patient was intubated for airway protection.  BP was high.  CT head: Large parenchymal hemorrhage within right basal ganglia region and surrounding white matter; mass-effect with 8 mm leftward midline shift without ventricle entrapment. Neuro consulted.  Hypertonic saline started. Started on Cleviprex. Patient admitted to ICU.  PCCM consulted for management of vent while intubated and medical management.  Pertinent  Medical History   Past Medical History:  Diagnosis Date   Hypertension    Stroke ([place])    Significant Hospital Events: Including procedures, antibiotic start and stop dates in addition to other pertinent events   10/14: adm with RT ICH with IVH & SAH seizure per EMS ,on cleviprex gtt 10/14 cardiology consulted for high troponin 18 K 10/15 MRI stable hematoma, SAH, IVH with midline shift 8 mm 10/16 EEG shows no evidence of seizure activity.  Interim History / Subjective:   Remains intubated.  Follows commands predictably.  Minimal secretions.  Given fentanyl overnight  for occasional dyssynchrony.   Objective   Blood pressure (!) 149/79, pulse 74, temperature 100.1 F (37.8 C), temperature source Axillary, resp. rate 19, height '5\' 3"'$  (1.6 m), weight 78 kg, SpO2 100 %.    Vent Mode: PRVC FiO2 (%):  [40 %] 40 % Set Rate:  [18 bmp] 18 bmp Vt Set:   [450 mL] 450 mL PEEP:  [5 cmH20] 5 cmH20 Pressure Support:  [10 cmH20] 10 cmH20 Plateau Pressure:  [11 cmH20-16 cmH20] 11 cmH20   Intake/Output Summary (Last 24 hours) at 06/05/2021 0759 Last data filed at 06/05/2021 0700 Gross per 24 hour  Intake 3482.74 ml  Output 195 ml  Net 3287.74 ml    Filed Weights   05/31/21 1600 06/02/21 0500 06/04/21 0500  Weight: 70 kg 75.3 kg 78 kg    Examination: General:  critically ill appearing on mech vent HEENT: MM pink/moist; ETT in place Neuro: Follows one-step commands with good strength on the right side.  No response to pain on the left.  Able to swallow to command.  Nods appropriately to questions. CV: s1s2, RRR, no m/r/g; on cleviprex PULM: Clear breath sounds bilateral, no accessory muscle use GI: soft, bsx4 active  Extremities: warm/dry, no edema  Skin: no rashes or lesions   Labs: hypernatremia.  Creatinine remains elevated  Resolved Hospital Problem list     Assessment & Plan:  Critically ill due to acute respiratory failure with hypoxia and hypercarbia related to large R intracerebral hemorrhage in R basal ganglia w/ 8 mm leftward shift from cerebral edema. Hypertensive urgency Hypernatremia, iatrogenic and secondary to insensitive losses.  AKI on CKD IV , rising creatinine Deemed not a candidate for dialysis by her outpatient nephrology Possible aspiration pneumonia Type 2 diabetes with hyperglycemia Hx of depression  Baseline function is poor and patient is not a candidate for long-term hemodialysis.  Best hope would be to tolerate one-way trial of extubation.  Plan:  -Trial of extubation today.  -Free water to correct hypernatremia. May help mentation further.  -Keep off all sedative infusions. -Continue current enteral antihypertensive regimen -Continue Keppra for prior seizure activity -Complete 5 days of Augmentin for possible aspiration - Palliative care consultation as patient has end-stage renal disease and  has been deemed not to be a suitable dialysis candidate.   Best Practice (right click and "Reselect all SmartList Selections" daily)   Diet/type: tubefeeds and NPO w/ meds via tube DVT prophylaxis: SCD GI prophylaxis: PPI Lines: N/A Foley:  Yes, and it is still needed Code Status:  full code Last date of multidisciplinary goals of care discussion [per primary]  Labs   CBC: Recent Labs  Lab 05/31/21 1600 05/31/21 1609 06/01/21 0448 06/02/21 0052 06/03/21 0652 06/04/21 0345 06/05/21 0452  WBC 14.9*  --  20.9* 14.0* 13.5* 11.6* 12.6*  NEUTROABS 12.6*  --   --   --   --   --   --   HGB 13.6   < > 11.1* 8.8* 8.6* 9.1* 9.6*  HCT 40.9   < > 33.3* 28.7* 27.3* 28.4* 30.3*  MCV 88.0  --  88.1 92.9 91.9 91.9 91.8  PLT 176  --  141* 94* 93* 97* 94*   < > = values in this interval not displayed.     Basic Metabolic Panel: Recent Labs  Lab 05/31/21 1902 05/31/21 2101 06/01/21 0448 06/01/21 1458 06/02/21 0052 06/02/21 0956 06/02/21 1700 06/02/21 2218 06/03/21 0652 06/04/21 0345 06/05/21 0452  NA 143   < > 153*   < > 162*   < > 157* 159* 152* 155* 150*  K  --   --  3.9  --  3.9  --   --   --  3.5 3.7 4.2  CL  --   --  126*  --  >130*  --   --   --  129* 130* 127*  CO2  --   --  14*  --  16*  --   --   --  17* 18* 14*  GLUCOSE  --   --  214*  --  222*  --   --   --  229* 256* 209*  BUN  --   --  63*  --  74*  --   --   --  82* 89* 96*  CREATININE  --   --  6.76*  --  7.17*  --   --   --  7.38* 7.69* 7.56*  CALCIUM  --   --  8.3*  --  8.1*  --   --   --  8.2* 8.1* 7.8*  MG 2.2  --   --   --  2.0  --   --   --   --   --   --   PHOS 5.3*  --   --   --  4.8*  --   --   --   --   --   --    < > = values in this interval not displayed.    GFR: Estimated Creatinine Clearance: 7.4 mL/min (A) (by C-G formula based on SCr of 7.56 mg/dL (H)). Recent Labs  Lab 05/31/21 1902 06/01/21 0448 06/02/21 0052 06/03/21 0652 06/04/21 0345 06/05/21 0452  WBC  --  20.9* 14.0* 13.5* 11.6*  12.6*  LATICACIDVEN 3.1* 1.8  --   --   --   --  Liver Function Tests: Recent Labs  Lab 05/31/21 1600  AST 61*  ALT 25  ALKPHOS 68  BILITOT 0.8  PROT 6.8  ALBUMIN 3.2*    No results for input(s): LIPASE, AMYLASE in the last 168 hours. No results for input(s): AMMONIA in the last 168 hours.  ABG    Component Value Date/Time   PHART 7.321 (L) 05/31/2021 1819   PCO2ART 30.5 (L) 05/31/2021 1819   PO2ART 95 05/31/2021 1819   HCO3 15.7 (L) 05/31/2021 1819   TCO2 17 (L) 05/31/2021 1819   ACIDBASEDEF 9.0 (H) 05/31/2021 1819   O2SAT 97.0 05/31/2021 1819      Coagulation Profile: Recent Labs  Lab 05/31/21 1600  INR 1.1     Cardiac Enzymes: No results for input(s): CKTOTAL, CKMB, CKMBINDEX, TROPONINI in the last 168 hours.  HbA1C: Hgb A1c MFr Bld  Date/Time Value Ref Range Status  05/31/2021 07:02 PM 6.0 (H) 4.8 - 5.6 % Final    Comment:    (NOTE) Pre diabetes:          5.7%-6.4%  Diabetes:              >6.4%  Glycemic control for   <7.0% adults with diabetes   08/13/2020 04:18 PM 6.3 (H) 4.8 - 5.6 % Final    Comment:    (NOTE) Pre diabetes:          5.7%-6.4%  Diabetes:              >6.4%  Glycemic control for   <7.0% adults with diabetes     CBG: Recent Labs  Lab 06/04/21 1153 06/04/21 1546 06/04/21 2010 06/04/21 2355 06/05/21 0346  GLUCAP 162* 190* 231* 211* 181*    CRITICAL CARE Performed by: Kipp Brood   Total critical care time: 35 minutes  Critical care time was exclusive of separately billable procedures and treating other patients.  Critical care was necessary to treat or prevent imminent or life-threatening deterioration.  Critical care was time spent personally by me on the following activities: development of treatment plan with patient and/or surrogate as well as nursing, discussions with consultants, evaluation of patient's response to treatment, examination of patient, obtaining history from patient or surrogate,  ordering and performing treatments and interventions, ordering and review of laboratory studies, ordering and review of radiographic studies, pulse oximetry, re-evaluation of patient's condition and participation in multidisciplinary rounds.  Kipp Brood, MD [facility] ICU Physician [place]  Pager: [phone] Mobile: [phone] After hours: [phone].

## 2021-06-05 NOTE — Procedures (Signed)
Extubation Procedure Note  Patient Details:   Name: Christina Nunez DOB: 1956/02/28 MRN: [id number]   Airway Documentation:    Vent end date: 06/05/21 Vent end time: 1010   Evaluation  O2 sats: currently acceptable Complications: Complications of mild obstruction Patient did tolerate procedure well. Bilateral Breath Sounds: Clear, Diminished   Yes 4L min [place] placed Unable to perform Incentive spirometer Nasal suction post extubation  Revonda Standard 06/05/2021, 10:11 AM

## 2021-06-05 NOTE — Progress Notes (Signed)
eLink Physician-Brief Progress Note Patient Name: Christina Nunez DOB: [birth year] MRN: [id number]   Date of Service  06/05/2021  HPI/Events of Note  Remains oliguric  Remains in AKI  Na remains elevated  eICU Interventions  Pt not dry, although Na still high, therefore I inc'ed the frequency of free water flushes in OGT.  Pt's oliguria is unchanged; prognosis guarded.      Intervention Category Intermediate Interventions: Oliguria - evaluation and management  Tilden Dome 06/05/2021, 2:45 AM

## 2021-06-05 NOTE — Progress Notes (Signed)
OT Cancellation Note  Patient Details Name: Marlasia Battaglia MRN: [id number] DOB: 03-20-1956   Cancelled Treatment:    Reason Eval/Treat Not Completed: Patient not medically ready;Other (comment) Pt recently extubated and nsg connerned about her respiratory status at this time. Will follow up later time.   Ramond Dial, OT/L   Acute OT Clinical Specialist Acute Rehabilitation Services Pager [phone] Office [phone]  06/05/2021, 11:11 AM

## 2021-06-05 NOTE — Progress Notes (Signed)
Notified CCM that pt has only had 75 mL of urine output for half of the shift. Bladder scan showed 153m. Patient is on free water flushes to help bring sodium down and it also receiving normal saline at 523mhr. Originally CCM MD ordered to increase the free water flushes from every 4 hours to every 2. I then mentioned that the patient is very edematous and that her creatinine was over 7. Free water flushes switched back to every 4 hours. No new orders, will continue to monitor.

## 2021-06-05 NOTE — Progress Notes (Signed)
STROKE TEAM PROGRESS NOTE   INTERVAL HISTORY Sister is at bedside.  Patient was extubated earlier by Dr. Lynetta Mare, however, pt still has respiratory distress and seems not tolerating extubation. However, sister really does not want Trach or PEG. Dr. Lucianne Muss discussed with sister, and pt code status is DNR now.    Vitals:   06/05/21 1000 06/05/21 1011 06/05/21 1100 06/05/21 1200  BP: (!) 154/93  140/81 (!) 162/89  Pulse: 90  85 86  Resp: '13  18 19  '$ Temp:    100 F (37.8 C)  TempSrc:    Axillary  SpO2: 100% 100% 99% 100%  Weight:      Height:       CBC:  Recent Labs  Lab 05/31/21 1600 05/31/21 1609 06/04/21 0345 06/05/21 0452  WBC 14.9*   < > 11.6* 12.6*  NEUTROABS 12.6*  --   --   --   HGB 13.6   < > 9.1* 9.6*  HCT 40.9   < > 28.4* 30.3*  MCV 88.0   < > 91.9 91.8  PLT 176   < > 97* 94*   < > = values in this interval not displayed.   Basic Metabolic Panel:  Recent Labs  Lab 05/31/21 1902 05/31/21 2101 06/02/21 0052 06/02/21 0956 06/04/21 0345 06/05/21 0452  NA 143   < > 162*   < > 155* 150*  K  --    < > 3.9   < > 3.7 4.2  CL  --    < > >130*   < > 130* 127*  CO2  --    < > 16*   < > 18* 14*  GLUCOSE  --    < > 222*   < > 256* 209*  BUN  --    < > 74*   < > 89* 96*  CREATININE  --    < > 7.17*   < > 7.69* 7.56*  CALCIUM  --    < > 8.1*   < > 8.1* 7.8*  MG 2.2  --  2.0  --   --   --   PHOS 5.3*  --  4.8*  --   --   --    < > = values in this interval not displayed.   Lipid Panel:  Recent Labs  Lab 05/31/21 1902 06/01/21 0448  CHOL 156  --   TRIG 179* 184*  HDL 33*  --   CHOLHDL 4.7  --   VLDL 36  --   LDLCALC 87  --    HgbA1c:  Recent Labs  Lab 05/31/21 1902  HGBA1C 6.0*   Urine Drug Screen: No results for input(s): LABOPIA, COCAINSCRNUR, LABBENZ, AMPHETMU, THCU, LABBARB in the last 168 hours.  Alcohol Level  Recent Labs  Lab 05/31/21 1902  ETH <10    IMAGING past 24 hours DG Abd Portable 1V  Result Date: 06/05/2021 CLINICAL DATA:   Feeding tube placement EXAM: PORTABLE ABDOMEN - 1 VIEW COMPARISON:  08/17/2020 FINDINGS: Feeding tube with weighted tip has been advanced to the gastric antrum. The stomach is decompressed. Visualized bowel gas pattern is normal. The lower abdomen is excluded. IMPRESSION: Feeding tube to the gastric antrum Electronically Signed   By: Lucrezia Europe M.D.   On: 06/05/2021 10:36    PHYSICAL EXAM  Temp:  [98.2 F (36.8 C)-100.1 F (37.8 C)] 100 F (37.8 C) (10/19 1200) Pulse Rate:  [65-90] 86 (10/19 1200) Resp:  [13-23] 19 (10/19  1200) BP: (120-170)/(66-93) 162/89 (10/19 1200) SpO2:  [99 %-100 %] 100 % (10/19 1200) FiO2 (%):  [40 %] 40 % (10/19 0740)  General - Well nourished, well developed, extubated now but with respiratory distress.  Ophthalmologic - fundi not visualized due to noncooperation.  Cardiovascular - Regular rate and rhythm.  Neuro - extubated this am but now with respiratory distress, eyes closed, not open on voice, with forced eye opening, she had right gaze, not able to have left gaze. Not blinking to visual threat bilaterally, right pupil 2.44m, left pupil 22m sluggish to light. Corneal reflex present, gag and cough present. Left facial droop.  Tongue protrusion not cooperative. Follows simple commands on the right hand and foot, no movement of left UE and LE. RUE proximal 3+/5, able to reach face spontaneously and finger grip 3/5. RLE 2+/5 proximal and toe DF/PF 3/5. DTR 1+ and no babinski. Sensation, coordination not cooperative and gait not tested.   ASSESSMENT/PLAN Ms. JoAribel Chaussees a [age]o female with a PMHx of mild dementia, CKD IV, pre DM, HLD, ischemic stroke, HTN, and depression admitted for unresponsiveness. Patient had seizure activity en route and was given Versed 2.'5mg'$  per EMS.   ICH - right BG large ICH with IVH and SAH, likely due to [place] large area of parenchymal hemorrhage centered within the right basal ganglia region. Subarachnoid  extention also present with intraventricular extension (ICH 4). 55m9meftward midline shift.  CTA head & neck No AVM or aneurysm CT repeat stable hematoma, midline shift, no hydrocephalus MRI brain stable hematoma, SAH, IVH and midline shift 8 mm 2D Echo EF 60 to 65% EEG no seizure but encephalopathy LDL 128 HgbA1c 6.3 VTE prophylaxis - SCDs aspirin 81 mg daily and clopidogrel 75 mg daily prior to admission, now on No antithrombotic given ICH Therapy recommendations: Pending Disposition: Pending  Cerebral edema CT and MRI showed stable midline shift 8 mm On 3% saline @ 75 -> FW 200 Q4h ->NS @ 50->off Na 142-149-153-163-163-160->157->152->155->150 Na monitoring Urine osmolarity 468  Respiratory failure Intubated on ventilation Stable mental status CCM on board Extubated on 10/19 -> not tolerating well -> sister decided no reintubation -> code status DNR  AKI on CKD  Cre 6.65->6.90->6.76->7.17->7.38->7.69->7.56 Baseline around 4 in 08/2020 Per sister, patient deemed not a candidate for dialysis by her nephrologist D/c IVF, on TF Continue to be oligouria, 200cc->75cc/day  CCM on board  Seizure Witnessed seizure with EMS Stat post Versed 2.5 mg Currently no clinical seizure EEG no seizure but encephalopathy Keppra 250 twice daily per renal adjustment  History of stroke 07/2020 admitted for right CR/SO lacunar infarct on MRI.  CT no acute abnormality.  MRI also showed old left cerebellum, left BG and thalamic, bilateral SO and CR infarcts.  MRI head and neck bilateral siphon moderate atherosclerosis, left more than right.  DVT negative.  EF 50 to 55%.  LDL 128, A1c 6.3.  Patient discharged with DAPT and Lipitor 40.  Hypertensive emergency Home meds: Lasix, Coreg 6.25 Stable, off Cleviprex Labetalol IV PRN On amlodipine 5->10 and Coreg 12.5 Long-term BP goal normotensive  Hyperlipidemia Home meds: Lipitor 40,  LDL 128, goal < 70 Consider statin at  discharge  Dysphagia On tube feeding @ 50  Other Stroke Risk Factors CAD status post CABG  Other Active Problems Leukocytosis WBC 20.9->11.6 Anemia of chronic disease, hemoglobin 11.1->8.8->8.6->9.1->9.6  Hospital day # 5  This patient is critically ill due to large right BG ICH, SAH, IVH,  cerebral edema, seizure, AKI on CKD and at significant risk of neurological worsening, death form brain herniation, renal failure, status epilepticus, sepsis. This patient's care requires constant monitoring of vital signs, hemodynamics, respiratory and cardiac monitoring, review of multiple databases, neurological assessment, discussion with family, other specialists and medical decision making of high complexity. I spent 35 minutes of neurocritical care time in the care of this patient. I had long discussion with sister at bedside, updated pt current condition, treatment plan and potential prognosis, and answered all the questions. She expressed understanding and appreciation. I discussed with CCM Dr. Lynetta Mare.   Rosalin Hawking, MD PhD Stroke Neurology 06/05/2021 12:57 PM   To contact Stroke Continuity provider, please refer to [email or web address] After hours, contact General Neurology

## 2021-06-06 ENCOUNTER — Inpatient Hospital Stay (HOSPITAL_COMMUNITY): Payer: Medicaid Other

## 2021-06-06 DIAGNOSIS — I629 Nontraumatic intracranial hemorrhage, unspecified: Secondary | ICD-10-CM | POA: Diagnosis not present

## 2021-06-06 DIAGNOSIS — J9601 Acute respiratory failure with hypoxia: Secondary | ICD-10-CM | POA: Diagnosis not present

## 2021-06-06 DIAGNOSIS — I615 Nontraumatic intracerebral hemorrhage, intraventricular: Secondary | ICD-10-CM | POA: Diagnosis not present

## 2021-06-06 DIAGNOSIS — I610 Nontraumatic intracerebral hemorrhage in hemisphere, subcortical: Secondary | ICD-10-CM | POA: Diagnosis not present

## 2021-06-06 DIAGNOSIS — Z7189 Other specified counseling: Secondary | ICD-10-CM | POA: Diagnosis not present

## 2021-06-06 DIAGNOSIS — I609 Nontraumatic subarachnoid hemorrhage, unspecified: Secondary | ICD-10-CM | POA: Diagnosis not present

## 2021-06-06 DIAGNOSIS — I619 Nontraumatic intracerebral hemorrhage, unspecified: Secondary | ICD-10-CM | POA: Diagnosis present

## 2021-06-06 DIAGNOSIS — N186 End stage renal disease: Secondary | ICD-10-CM | POA: Diagnosis not present

## 2021-06-06 LAB — BASIC METABOLIC PANEL
Anion gap: 12 (ref 5–15)
BUN: 107 mg/dL — ABNORMAL HIGH (ref 8–23)
CO2: 13 mmol/L — ABNORMAL LOW (ref 22–32)
Calcium: 8.5 mg/dL — ABNORMAL LOW (ref 8.9–10.3)
Chloride: 126 mmol/L — ABNORMAL HIGH (ref 98–111)
Creatinine, Ser: 7.62 mg/dL — ABNORMAL HIGH (ref 0.44–1.00)
GFR, Estimated: 6 mL/min — ABNORMAL LOW (ref >60)
Glucose, Bld: 131 mg/dL — ABNORMAL HIGH (ref 70–99)
Potassium: 4.7 mmol/L (ref 3.5–5.1)
Sodium: 151 mmol/L — ABNORMAL HIGH (ref 135–145)

## 2021-06-06 LAB — CBC
HCT: 33 % — ABNORMAL LOW (ref 36.0–46.0)
Hemoglobin: 10.3 g/dL — ABNORMAL LOW (ref 12.0–15.0)
MCH: 29.1 pg (ref 26.0–34.0)
MCHC: 31.2 g/dL (ref 30.0–36.0)
MCV: 93.2 fL (ref 80.0–100.0)
Platelets: 123 10*3/uL — ABNORMAL LOW (ref 150–400)
RBC: 3.54 MIL/uL — ABNORMAL LOW (ref 3.87–5.11)
RDW: 15.7 % — ABNORMAL HIGH (ref 11.5–15.5)
WBC: 12.8 10*3/uL — ABNORMAL HIGH (ref 4.0–10.5)
nRBC: 0 % (ref 0.0–0.2)

## 2021-06-06 LAB — GLUCOSE, CAPILLARY
Glucose-Capillary: 118 mg/dL — ABNORMAL HIGH (ref 70–99)
Glucose-Capillary: 93 mg/dL (ref 70–99)
Glucose-Capillary: 119 mg/dL — ABNORMAL HIGH (ref 70–99)
Glucose-Capillary: 139 mg/dL — ABNORMAL HIGH (ref 70–99)
Glucose-Capillary: 192 mg/dL — ABNORMAL HIGH (ref 70–99)
Glucose-Capillary: 223 mg/dL — ABNORMAL HIGH (ref 70–99)

## 2021-06-06 MED ORDER — CARVEDILOL 12.5 MG PO TABS
25.0000 mg | ORAL_TABLET | Freq: Two times a day (BID) | ORAL | Status: DC
  Administered 2021-06-06 – 2021-06-08 (×4): 25 mg
  Filled 2021-06-06 (×4): qty 2

## 2021-06-06 MED ORDER — GLUCERNA 1.5 CAL PO LIQD
1000.0000 mL | ORAL | Status: DC
  Filled 2021-06-06: qty 1000

## 2021-06-06 MED ORDER — PROSOURCE TF PO LIQD
45.0000 mL | Freq: Every day | ORAL | Status: DC
  Administered 2021-06-07 – 2021-06-08 (×2): 45 mL
  Filled 2021-06-06 (×2): qty 45

## 2021-06-06 MED ORDER — JEVITY 1.2 CAL PO LIQD
1000.0000 mL | ORAL | Status: DC
  Administered 2021-06-07 – 2021-06-08 (×2): 1000 mL
  Filled 2021-06-06 (×4): qty 1000

## 2021-06-06 NOTE — Evaluation (Signed)
Occupational Therapy Evaluation Patient Details Name: Christina Nunez MRN: [id number] DOB: [birth year] Today's Date: 06/06/2021   History of Present Illness Pt is 65 yo female who presents after being found unresponsive with large R ICH, SAH, cerebral edema, seizure, AKI on CKD. PMH:  HTN, mild dementia, depression/anxiety, and likely CABG (sternotomy wires present on CXR), L CVA in 2021, CKD, pre DM, HLD.   Clinical Impression   Pt from home with family. Per previous chart review, was ambulating without an AD and family assisted with IADL tasks. Transitioned to EOB with total A +2 using bed pad. No increase in level of arousal however pt attempting to open eyes.Nodding head yes/no appropriately and following commands as level of arousal allowed.  Flaccid LUE - keep supported on pillows. If level of arousal improves recommend rehab at SNF. Will follow acutely.      Recommendations for follow up therapy are one component of a multi-disciplinary discharge planning process, led by the attending physician.  Recommendations may be updated based on patient status, additional functional criteria and insurance authorization.   Follow Up Recommendations  SNF (May need long term care)    Equipment Recommendations       Recommendations for Other Services       Precautions / Restrictions Precautions Precautions: Fall      Mobility Bed Mobility Overal bed mobility: Needs Assistance             General bed mobility comments: total A +2 to transition to EOB using bed pad    Transfers                 General transfer comment: bed placed in chair position,    Balance Overall balance assessment: Needs assistance   Sitting balance-Leahy Scale: Zero                                     ADL either performed or assessed with clinical judgement   ADL                                         General ADL Comments: total A with all ADL tasks      Vision   Additional Comments: eyes closed entire session; Pt with attempts to open eyes; when amnually opened, R gaze     Perception Perception Comments: impaired   Praxis Praxis Praxis-Other Comments: impaired    Pertinent Vitals/Pain Pain Assessment: Faces Faces Pain Scale: Hurts little more Pain Location: general discomfort Pain Descriptors / Indicators: Discomfort Pain Intervention(s): Limited activity within patient's tolerance     Hand Dominance Right   Extremity/Trunk Assessment Upper Extremity Assessment Upper Extremity Assessment: LUE deficits/detail LUE Deficits / Details: flaccid   Lower Extremity Assessment Lower Extremity Assessment: Defer to PT evaluation   Cervical / Trunk Assessment Cervical / Trunk Exceptions:  (posterior pelvic tilt; kyphotic; unableto hold head upright)   Communication Communication Communication:  (intubated)   Cognition Arousal/Alertness: Lethargic Behavior During Therapy: Flat affect Overall Cognitive Status: Difficult to assess                                 General Comments: pt keeps eyes closed throughout but responds to stimulation and nods head yes and no appropriately to simple  questions   General Comments       Exercises Exercises: Other exercises Other Exercises Other Exercises: gentle passive LUE ROM   Shoulder Instructions      Home Living Family/patient expects to be discharged to:: Private residence Living Arrangements: Other relatives Available Help at Discharge: Family;Available PRN/intermittently Type of Home: Apartment Home Access: Stairs to enter     Home Layout: Two level;Bed/bath upstairs     Bathroom Shower/Tub: Teacher, early years/pre: Standard         Additional Comments: info from chart review but pt was able to confirm that she still lives with her sister in an apt.      Prior Functioning/Environment Level of Independence: Needs assistance         Comments: lived at home with family; having [facility] services        OT Problem List: Decreased strength;Decreased range of motion;Decreased activity tolerance;Impaired balance (sitting and/or standing);Impaired vision/perception;Decreased coordination;Decreased cognition;Decreased safety awareness;Cardiopulmonary status limiting activity;Impaired sensation;Impaired tone;Obesity;Impaired UE functional use;Pain;Increased edema      OT Treatment/Interventions: Self-care/ADL training;Therapeutic exercise;Neuromuscular education;DME and/or AE instruction;Therapeutic activities;Cognitive remediation/compensation;Visual/perceptual remediation/compensation;Patient/family education;Balance training;Splinting    OT Goals(Current goals can be found in the care plan section) Acute Rehab OT Goals Patient Stated Goal: pt unable to state OT Goal Formulation: Patient unable to participate in goal setting Time For Goal Achievement: 06/20/21 Potential to Achieve Goals: Fair  OT Frequency: Min 2X/week   Barriers to D/C:            Co-evaluation              AM-PAC OT "6 Clicks" Daily Activity     Outcome Measure Help from another person eating meals?: Total Help from another person taking care of personal grooming?: Total Help from another person toileting, which includes using toliet, bedpan, or urinal?: Total Help from another person bathing (including washing, rinsing, drying)?: Total Help from another person to put on and taking off regular upper body clothing?: Total Help from another person to put on and taking off regular lower body clothing?: Total 6 Click Score: 6   End of Session Nurse Communication: Need for lift equipment  Activity Tolerance: Patient limited by lethargy Patient left: in bed;with call bell/phone within reach  OT Visit Diagnosis: Unsteadiness on feet (R26.81);Other abnormalities of gait and mobility (R26.89);Muscle weakness (generalized) (M62.81);Low vision, both eyes  (H54.2);Other symptoms and signs involving cognitive function;Other symptoms and signs involving the nervous system (R29.898);Hemiplegia and hemiparesis Hemiplegia - Right/Left: Left Hemiplegia - dominant/non-dominant: Non-Dominant Hemiplegia - caused by: Nontraumatic intracerebral hemorrhage;Nontraumatic SAH (IVH)                Time: [id number] OT Time Calculation (min): 29 min Charges:  OT General Charges $OT Visit: 1 Visit OT Evaluation $OT Eval High Complexity: 1 High  [place], OT/L   Acute OT Clinical Specialist [place] Pager [phone] Office [phone]   [facility] 06/06/2021, 12:55 PM

## 2021-06-06 NOTE — Progress Notes (Signed)
Nutrition Follow-up  DOCUMENTATION CODES:   Not applicable  INTERVENTION:   Tube feeding via Cortrak tube: Transition to Jevity 1.2 @ 60 mL/hr (1440 mL/day) 45 mL ProSource TF - Daily 200 mL free water flush q4h  Provides 1768 kcal, 91 gm PRO, 1162 mL free water daily (2362 mL total free water)  NUTRITION DIAGNOSIS:   Inadequate oral intake related to inability to eat as evidenced by NPO status. - Progressing, pt on TF  GOAL:   Patient will meet greater than or equal to 90% of their needs - Progressing, pt needs being met via TF  MONITOR:   Diet advancement, TF tolerance, I & O's, Labs  REASON FOR ASSESSMENT:   Consult, Ventilator Enteral/tube feeding initiation and management  ASSESSMENT:   Pt with a PMH significant for ischemic stroke, HTN, HLD, CKD IV, mild dementia, pre-DM admitted with large R intracerebral hemorrhage in R basal ganglia w/ 8cm leftward shift, cerebral edema, and hypertensive emergency.  10/14: Intubated 10/19: Extubated; Cortrak placement - tip in stomach 10/20: SLP - recs NPO   Jevity 1.5 hanging at a rate of 20 mL/hr (not goal), per RN TF was just restarted so started at a lower rate.   Per Neurology note on 10/19, pt was not tolerating extubation yesterday, but sister does not wish to proceed with trach or PEG at this time. Pt now stable and tolerating extubation.  Per CCM note, pt has now transitioned to ESRD and is not a candidate for dialysis. Nephrology and Palliative Care have been consulted.   Medications reviewed and include: Fiber, SSI 0-6 units q4h, Levemir 30 units, Protonix, Demadex Labs reviewed: Sodium 151, 24 hr BG trends 93-242  Admission Weight: 70 kg Current Weight: Unknown  UOP: 75 mL + 2 unmeasured occurrences x 24 hr I/O's: +10.3 L since admit  Diet Order:   Diet Order             Diet NPO time specified  Diet effective now                   EDUCATION NEEDS:   No education needs have been identified at  this time  Skin:  Skin Assessment: Reviewed RN Assessment  Last BM:  06/05/2021  Height:   Ht Readings from Last 1 Encounters:  05/31/21 5' 3" (1.6 m)    Weight:   Wt Readings from Last 1 Encounters:  06/04/21 78 kg    Ideal Body Weight:  52.3 kg  BMI:  Body mass index is 30.46 kg/m.  Estimated Nutritional Needs:   Kcal:  1750-1950  Protein:  85-100 grams  Fluid:  >1.8L/day      BS, PLDN Clinical Dietitian See AMiON for contact information.   

## 2021-06-06 NOTE — Consult Note (Signed)
Consultation Note Date: 06/06/2021   Patient Name: Christina Nunez  DOB: 06/10/1956  MRN: [id number]  Age / Sex: 65 y.o., female  PCP: [facility] Referring Physician: Stroke, Md, MD  Reason for Consultation: Establishing goals of care  HPI/Patient Profile: 65 y.o. female  with past medical history of mild dementia, CKD IV, pre DM, HLD, ischemic stroke, HTN, and depression, admitted on 05/31/2021 with unresponsiveness and hypertensive emergency.   Patient had seizure activity en route via EMS and was intubated. Imaging has shown ICH with IVH, SAH, and 8 mm midline shift. Hospital course was further complicated by AKI/CKD Stage IV. Patient was discussing dialysis as an outpatient and is not considered a candidate. PMT has been consulted to assist with goals of care conversation.  Clinical Assessment and Goals of Care:  I have reviewed medical records including EPIC notes, labs and imaging, assessed the patient and then called patient's sister [name] to discuss diagnosis prognosis, GOC, EOL wishes, disposition and options.  I introduced Palliative Medicine as specialized medical care for people living with serious illness. It focuses on providing relief from the symptoms and stress of a serious illness. The goal is to improve quality of life for both the patient and the family.  We discussed a brief life review of the patient and then focused on their current illness. The natural disease trajectory and expectations at EOL were discussed. Patient is described as joyful and lighthearted. Patient's sister Lester Chester shares that she has been her primary caregiver since October of 2021. Patient has two sons, but they have been minimally involved since dropping patient off with [name] at that time. Patient couldn't move for the first several months and slept most of the day. She would not take her  medications or eat. She was hospitalized in December 2021 and participated in rehab until February 2022. She gradually progressed to the point that she could get around the house, including stairs. A caregiver has been providing assistance for the past two months. Patient continues to sleep very frequently and [name] acknowledges her depression. She is now eating more, although often choosing unhealthy foods.  [name] is feeling hopeful today, as patient's respiratory distress seems to have improved some since yesterday. Patient is also more alert and responds to questions by shaking her head. [name] reports that patient has indicated her interest in participating with therapies. We reviewed previous discussions with nephrology and the concern that patient's AKI has resulted in progression to ESRD. Discussed prognosis without dialysis, as patient is not a candidate. [name] understands this and currently hopes that patient can avoid further progression of kidney failure. She understands that the next 48 hours are critical and would be willing to discuss comfort care further if kidney function deteriorates further despite medical management.  I attempted to elicit values and goals of care important to the patient.    The difference between aggressive medical intervention and comfort care was considered in light of the patient's goals of care.   Advanced directives, concepts specific  to code status, artifical feeding and hydration, and rehospitalization were considered and discussed.  Hospice and Palliative Care services outpatient were explained and offered.  Discussed the importance of continued conversation with family and the medical providers regarding overall plan of care and treatment options, ensuring decisions are within the context of the patient's values and GOCs.    Questions and concerns were addressed. The family was encouraged to call with questions or concerns.  PMT will continue to  support holistically.   NEXT OF KIN are patient's two sons however they are not involved in her care or medical decision making. Patient's sister is the primary contact.     SUMMARY OF RECOMMENDATIONS   -DNR confirmed -Continue current care for now; patient's sister is hopeful for recovery and would like to monitor over the next 48 hours -No trach or PEG -Patient's sister is willing to consider comfort care if she declines further -Ongoing discussions pending clinical course  Code Status/Advance Care Planning: DNR  Palliative Prophylaxis:  Aspiration, Frequent Pain Assessment, and Turn Reposition  Additional Recommendations (Limitations, Scope, Preferences): No Artificial Feeding and No Tracheostomy  Psycho-social/Spiritual:  Desire for further Chaplaincy support:TBD Additional Recommendations: Education on Hospice  Prognosis:  Guarded  Discharge Planning: To Be Determined      Primary Diagnoses: Present on Admission:  ICH (intracerebral hemorrhage) ([place])   I have reviewed the medical record, interviewed the patient and family, and examined the patient. The following aspects are pertinent.  Past Medical History:  Diagnosis Date   Hypertension    Stroke [facility])    Social History   Socioeconomic History   Marital status: Single    Spouse name: Not on file   Number of children: Not on file   Years of education: Not on file   Highest education level: Not on file  Occupational History   Not on file  Tobacco Use   Smoking status: Not on file   Smokeless tobacco: Not on file  Substance and Sexual Activity   Alcohol use: Not on file   Drug use: Not on file   Sexual activity: Not on file  Other Topics Concern   Not on file  Social History Narrative   Not on file   Social Determinants of Health   Financial Resource Strain: Not on file  Food Insecurity: Not on file  Transportation Needs: Not on file  Physical Activity: Not on file  Stress: Not on file   Social Connections: Not on file   No family history on file. Scheduled Meds:   stroke: mapping our early stages of recovery book   Does not apply Once   amLODipine  10 mg Per Tube Daily   carvedilol  25 mg Per Tube BID WC   chlorhexidine  15 mL Mouth Rinse BID   Chlorhexidine Gluconate Cloth  6 each Topical Daily   feeding supplement (PROSource TF)  45 mL Per Tube BID   fiber  1 packet Per Tube BID   free water  200 mL Per Tube Q4H   heparin injection (subcutaneous)  5,000 Units Subcutaneous Q8H   insulin aspart  0-6 Units Subcutaneous Q4H   insulin detemir  30 Units Subcutaneous Daily   levETIRAcetam  250 mg Per Tube BID   mouth rinse  15 mL Mouth Rinse q12n4p   pantoprazole sodium  40 mg Per Tube Daily   torsemide  40 mg Per Tube Daily   Continuous Infusions:  feeding supplement (GLUCERNA 1.5 CAL)  feeding supplement (JEVITY 1.5 CAL/FIBER) 60 mL/hr at 06/05/21 0619   PRN Meds:.acetaminophen **OR** acetaminophen (TYLENOL) oral liquid 160 mg/5 mL **OR** acetaminophen, labetalol, oxyCODONE Medications Prior to Admission:  Prior to Admission medications   Medication Sig Start Date End Date Taking? Authorizing Provider  atorvastatin (LIPITOR) 40 MG tablet Take 1 tablet (40 mg total) by mouth daily. 08/22/20  Yes Regalado, Belkys A, MD  carvedilol (COREG) 6.25 MG tablet Take 1 tablet (6.25 mg total) by mouth 2 (two) times daily with a meal. 08/21/20  Yes Regalado, Belkys A, MD  furosemide (LASIX) 80 MG tablet Take 1 tablet (80 mg total) by mouth daily. Patient taking differently: Take 80 mg by mouth every other day. 08/25/20  Yes Domenic Polite, MD  Vitamin D, Ergocalciferol, (DRISDOL) 1.25 MG (50000 UNIT) CAPS capsule Take 50,000 Units by mouth every 7 (seven) days.   Yes [provider]  amLODipine (NORVASC) 2.5 MG tablet Take 1 tablet (2.5 mg total) by mouth 2 (two) times daily. Patient not taking: Reported on 06/01/2021 08/21/20   Regalado, Jerald Kief A, MD  pantoprazole  (PROTONIX) 40 MG tablet Take 1 tablet (40 mg total) by mouth daily. Patient not taking: Reported on 06/01/2021 08/22/20   Regalado, Jerald Kief A, MD  sodium bicarbonate 650 MG tablet Take 1 tablet (650 mg total) by mouth 2 (two) times daily. Patient not taking: Reported on 06/01/2021 08/21/20   Niel Hummer A, MD   Allergies  Allergen Reactions   Latex Itching and Rash   Review of Systems  Unable to perform ROS: Acuity of condition   Physical Exam Vitals and nursing note reviewed.  Constitutional:      General: She is not in acute distress.    Appearance: She is ill-appearing.  Cardiovascular:     Rate and Rhythm: Normal rate.  Pulmonary:     Effort: Pulmonary effort is normal.  Skin:    General: Skin is warm and dry.  Neurological:     Mental Status: She is lethargic.    Vital Signs: BP (!) 167/91   Pulse 71   Temp 98.3 F (36.8 C)   Resp 11   Ht '5\' 3"'$  (1.6 m)   Wt 78 kg   SpO2 98%   BMI 30.46 kg/m  Pain Scale: Faces   Pain Score: Asleep   SpO2: SpO2: 98 % O2 Device:SpO2: 98 % O2 Flow Rate: .O2 Flow Rate (L/min): 1 L/min  IO: Intake/output summary:  Intake/Output Summary (Last 24 hours) at 06/06/2021 1111 Last data filed at 06/06/2021 0508 Gross per 24 hour  Intake 165.67 ml  Output 425 ml  Net -259.33 ml    LBM: Last BM Date: 06/05/21 Baseline Weight: Weight: 70 kg Most recent weight: Weight: 78 kg     Palliative Assessment/Data: 10%     Time In: 10:30am Time Out: 11:30am Time Total: 60 minutes Greater than 50% of this time was spent in counseling and coordinating care related to the above assessment and plan.  Dorthy Cooler, PA-C Palliative Medicine Team Team phone # [phone]  Thank you for allowing the Palliative Medicine Team to assist in the care of this patient. Please utilize secure chat with additional questions, if there is no response within 30 minutes please call the above phone number.  Palliative Medicine Team providers are  available by phone from 7am to 7pm daily and can be reached through the team cell phone.  Should this patient require assistance outside of these hours, please call the patient's attending physician.

## 2021-06-06 NOTE — Progress Notes (Addendum)
STROKE TEAM PROGRESS NOTE   INTERVAL HISTORY No family is at bedside.  Patient was extubated yesterday and still has mild to moderate respiratory distress, however tolerating extubation so far.  Still lethargic, eyes closed, however follows some commands on the right side.  Still left hemiplegia.  Will repeat CT today.  CCM is calling nephrology for consult for ESRD.   Vitals:   06/06/21 0530 06/06/21 0600 06/06/21 0700 06/06/21 1100  BP: (!) 160/94 (!) 142/90 (!) 167/91   Pulse: 71 75 71   Resp: 10 (!) 9 11   Temp:   98.3 F (36.8 C) 98.1 F (36.7 C)  TempSrc:      SpO2: 98% 97% 98%   Weight:      Height:       CBC:  Recent Labs  Lab 05/31/21 1600 05/31/21 1609 06/05/21 0452 06/06/21 0341  WBC 14.9*   < > 12.6* 12.8*  NEUTROABS 12.6*  --   --   --   HGB 13.6   < > 9.6* 10.3*  HCT 40.9   < > 30.3* 33.0*  MCV 88.0   < > 91.8 93.2  PLT 176   < > 94* 123*   < > = values in this interval not displayed.   Basic Metabolic Panel:  Recent Labs  Lab 05/31/21 1902 05/31/21 2101 06/02/21 0052 06/02/21 0956 06/05/21 0452 06/06/21 0341  NA 143   < > 162*   < > 150* 151*  K  --    < > 3.9   < > 4.2 4.7  CL  --    < > >130*   < > 127* 126*  CO2  --    < > 16*   < > 14* 13*  GLUCOSE  --    < > 222*   < > 209* 131*  BUN  --    < > 74*   < > 96* 107*  CREATININE  --    < > 7.17*   < > 7.56* 7.62*  CALCIUM  --    < > 8.1*   < > 7.8* 8.5*  MG 2.2  --  2.0  --   --   --   PHOS 5.3*  --  4.8*  --   --   --    < > = values in this interval not displayed.   Lipid Panel:  Recent Labs  Lab 05/31/21 1902 06/01/21 0448  CHOL 156  --   TRIG 179* 184*  HDL 33*  --   CHOLHDL 4.7  --   VLDL 36  --   LDLCALC 87  --    HgbA1c:  Recent Labs  Lab 05/31/21 1902  HGBA1C 6.0*   Urine Drug Screen: No results for input(s): LABOPIA, COCAINSCRNUR, LABBENZ, AMPHETMU, THCU, LABBARB in the last 168 hours.  Alcohol Level  Recent Labs  Lab 05/31/21 1902  ETH <10    IMAGING past 24  hours No results found.  PHYSICAL EXAM  Temp:  [98.1 F (36.7 C)-100.7 F (38.2 C)] 98.1 F (36.7 C) (10/20 1100) Pulse Rate:  [66-89] 71 (10/20 0700) Resp:  [9-22] 11 (10/20 0700) BP: (127-177)/(79-103) 167/91 (10/20 0700) SpO2:  [97 %-100 %] 98 % (10/20 0700)  General - Well nourished, well developed, mild to moderate respiratory distress.  Ophthalmologic - fundi not visualized due to noncooperation.  Cardiovascular - Regular rate and rhythm.  Neuro - mild to moderate respiratory distress off extubation, eyes closed, not open  on voice, with forced eye opening, she had right gaze, not able to have left gaze. Not blinking to visual threat bilaterally, right pupil 2.26m, left pupil 2514m sluggish to light. Corneal reflex present, gag and cough present. Left facial droop.  Tongue protrusion not cooperative. Follows simple commands on the right hand and foot, no movement of left UE and LE. RUE proximal 3/5 and finger grip 3/5. RLE 2+/5 proximal and toe DF/PF 3/5. DTR 1+ and no babinski. Sensation, coordination not cooperative and gait not tested.   ASSESSMENT/PLAN [name] a [age]o female with a PMHx of mild dementia, CKD IV, pre DM, HLD, ischemic stroke, HTN, and depression admitted for unresponsiveness. Patient had seizure activity en route and was given Versed 2.'5mg'$  per EMS.   ICH - right BG large ICH with IVH and SAH, likely due to hypertensive emergency CTLansing large area of parenchymal hemorrhage centered within the right basal ganglia region. Subarachnoid extention also present with intraventricular extension (ICH 4). 14m11meftward midline shift.  CTA head & neck No AVM or aneurysm CT repeat stable hematoma, midline shift, no hydrocephalus MRI brain stable hematoma, SAH, IVH and midline shift 8 mm CT repeat pending 2D Echo EF 60 to 65% EEG no seizure but encephalopathy LDL 128 HgbA1c 6.3 VTE prophylaxis - SCDs aspirin 81 mg daily and clopidogrel 75 mg daily prior to  admission, now on No antithrombotic given ICH Therapy recommendations: Pending Disposition: Pending  Cerebral edema CT and MRI showed stable midline shift 8 mm On 3% saline @ 75 -> FW 200 Q4h ->NS @ 50->off Na 142-149-153-163-163-160->157->152->155->150->151 Allow Na trending down gradually CT repeat pending  Respiratory failure Intubated on ventilation Stable mental status CCM on board Extubated on 10/19 -> mild to moderate respite distress -> sister decided no reintubation -> code status DNR  AKI on CKD  Cre 6.65->6.90->6.76->7.17->7.38->7.69->7.56->7.62 Baseline around 4 in 08/2020 Per sister, patient deemed not a candidate for dialysis by her nephrologist D/c IVF, on TF Continue to be oligouria  CCM on board Nephrology was consulted by CCM  Seizure Witnessed seizure with EMS, stat post Versed 2.5 mg Currently no clinical seizure EEG no seizure but encephalopathy Keppra 250 twice daily->d/c due to potential sedative effect and no more clinical seizure seen  History of stroke 07/2020 admitted for right CR/SO lacunar infarct on MRI.  CT no acute abnormality.  MRI also showed old left cerebellum, left BG and thalamic, bilateral SO and CR infarcts.  MRI head and neck bilateral siphon moderate atherosclerosis, left more than right.  DVT negative.  EF 50 to 55%.  LDL 128, A1c 6.3.  Patient discharged with DAPT and Lipitor 40.  Hypertensive emergency Home meds: Lasix, Coreg 6.25 Stable, off Cleviprex Labetalol IV PRN On amlodipine 5->10 and Coreg 12.5 Long-term BP goal normotensive  Hyperlipidemia Home meds: Lipitor 40,  LDL 128, goal < 70 Consider statin at discharge  Dysphagia On tube feeding @ 50  Other Stroke Risk Factors CAD status post CABG  Other Active Problems Leukocytosis WBC 20.9->11.6 Anemia of chronic disease, hemoglobin 11.1->8.8->8.6->9.1->9.6->10.3  Hospital day # 6  This patient is critically ill due to large right BG ICH, SAH, IVH, cerebral  edema, seizure, AKI on ESRD and at significant risk of neurological worsening, death form brain herniation, renal failure, sepsis, aspiration. This patient's care requires constant monitoring of vital signs, hemodynamics, respiratory and cardiac monitoring, review of multiple databases, neurological assessment, discussion with family, other specialists and medical decision making of high complexity.  I spent 35 minutes of neurocritical care time in the care of this patient. I discussed with CCM Dr. Lynetta Mare.   Rosalin Hawking, MD PhD Stroke Neurology 06/06/2021 12:24 PM   To contact Stroke Continuity provider, please refer to [email or web address] After hours, contact General Neurology

## 2021-06-06 NOTE — Consult Note (Signed)
Reason for Consult: AKI/CKD stage IV Referring Physician: Lynetta Mare, MD  Leshonda Galambos is an 65 y.o. female with a PMH significant for HTN, depression, anxiety, dementia, CAD s/p CABG, ischemic strokes, severe dementia, and CKD stage IV who presented to [facility] ED on 05/31/21 after being found unresponsive on the floor next to her bed.  EMS arrived and per reports had seizure like activity en route to [facility] ED.  In the ED, she was intubated for airway protection.  BP was elevated and CT scan revealed large ICH within the right basal ganglia with mass effect.  She was started on hypertonic saline, cleviprex, and admitted to ICU.  She was extubated yesterday and per family is DNR and not to be re-intubated.  Her hospital course was further complicated by AKI/CKD Stage IV.  The trend in Scr is seen below.  We were consulted to further evaluate and manage her AKI/CKD.  She is followed at our office by Dr. Joylene Grapes who has spoken with family that she is not a candidate for dialysis due to her severe dementia.  They were in agreement with conservative management and involving hospice if she were to progress to ESRD.    Trend in Creatinine: Creatinine, Ser  Date/Time Value Ref Range Status  06/06/2021 03:41 AM 7.62 (H) 0.44 - 1.00 mg/dL Final  06/05/2021 04:52 AM 7.56 (H) 0.44 - 1.00 mg/dL Final  06/04/2021 03:45 AM 7.69 (H) 0.44 - 1.00 mg/dL Final  06/03/2021 06:52 AM 7.38 (H) 0.44 - 1.00 mg/dL Final  06/02/2021 12:52 AM 7.17 (H) 0.44 - 1.00 mg/dL Final  06/01/2021 04:48 AM 6.76 (H) 0.44 - 1.00 mg/dL Final  05/31/2021 04:09 PM 6.90 (H) 0.44 - 1.00 mg/dL Final  05/31/2021 04:00 PM 6.65 (H) 0.44 - 1.00 mg/dL Final  08/25/2020 02:21 AM 4.16 (H) 0.44 - 1.00 mg/dL Final  08/24/2020 08:54 AM 4.29 (H) 0.44 - 1.00 mg/dL Final  08/23/2020 10:36 AM 4.23 (H) 0.44 - 1.00 mg/dL Final  08/20/2020 11:55 AM 4.31 (H) 0.44 - 1.00 mg/dL Final  08/19/2020 04:33 AM 4.55 (H) 0.44 - 1.00 mg/dL Final  08/18/2020 02:22 AM 4.92 (H)  0.44 - 1.00 mg/dL Final  08/17/2020 04:00 AM 4.79 (H) 0.44 - 1.00 mg/dL Final  08/16/2020 12:14 AM 4.78 (H) 0.44 - 1.00 mg/dL Final  08/15/2020 02:39 AM 4.40 (H) 0.44 - 1.00 mg/dL Final  08/14/2020 07:19 AM 3.85 (H) 0.44 - 1.00 mg/dL Final  08/13/2020 04:18 PM 3.72 (H) 0.44 - 1.00 mg/dL Final    PMH:   Past Medical History:  Diagnosis Date   Hypertension    Stroke [facility])     PSH:  History reviewed. No pertinent surgical history.  Allergies:  Allergies  Allergen Reactions   Latex Itching and Rash    Medications:   Prior to Admission medications   Medication Sig Start Date End Date Taking? Authorizing Provider  atorvastatin (LIPITOR) 40 MG tablet Take 1 tablet (40 mg total) by mouth daily. 08/22/20  Yes Regalado, Belkys A, MD  carvedilol (COREG) 6.25 MG tablet Take 1 tablet (6.25 mg total) by mouth 2 (two) times daily with a meal. 08/21/20  Yes Regalado, Belkys A, MD  furosemide (LASIX) 80 MG tablet Take 1 tablet (80 mg total) by mouth daily. Patient taking differently: Take 80 mg by mouth every other day. 08/25/20  Yes Domenic Polite, MD  Vitamin D, Ergocalciferol, (DRISDOL) 1.25 MG (50000 UNIT) CAPS capsule Take 50,000 Units by mouth every 7 (seven) days.   Yes [provider]  amLODipine (NORVASC) 2.5 MG tablet Take 1 tablet (2.5 mg total) by mouth 2 (two) times daily. Patient not taking: Reported on 06/01/2021 08/21/20   Regalado, Jerald Kief A, MD  pantoprazole (PROTONIX) 40 MG tablet Take 1 tablet (40 mg total) by mouth daily. Patient not taking: Reported on 06/01/2021 08/22/20   Regalado, Jerald Kief A, MD  sodium bicarbonate 650 MG tablet Take 1 tablet (650 mg total) by mouth 2 (two) times daily. Patient not taking: Reported on 06/01/2021 08/21/20   Elmarie Shiley, MD    Inpatient medications:   stroke: mapping our early stages of recovery book   Does not apply Once   amLODipine  10 mg Per Tube Daily   carvedilol  25 mg Per Tube BID WC   chlorhexidine  15 mL Mouth Rinse BID    Chlorhexidine Gluconate Cloth  6 each Topical Daily   feeding supplement (PROSource TF)  45 mL Per Tube BID   fiber  1 packet Per Tube BID   free water  200 mL Per Tube Q4H   heparin injection (subcutaneous)  5,000 Units Subcutaneous Q8H   insulin aspart  0-6 Units Subcutaneous Q4H   insulin detemir  30 Units Subcutaneous Daily   mouth rinse  15 mL Mouth Rinse q12n4p   pantoprazole sodium  40 mg Per Tube Daily   torsemide  40 mg Per Tube Daily    Discontinued Meds:   Medications Discontinued During This Encounter  Medication Reason   norepinephrine (LEVOPHED) 39m in 2547mpremix infusion    propofol (DIPRIVAN) 1000 MG/100ML infusion    labetalol (NORMODYNE) injection 20 mg    propofol (DIPRIVAN) 1000 MG/100ML infusion    propofol (DIPRIVAN) 1000 MG/100ML infusion    clevidipine (CLEVIPREX) infusion 0.5 mg/mL    senna-docusate (Senokot-S) tablet 1 tablet    FLUoxetine (PROZAC) 20 MG/5ML solution 40 mg Discontinued by provider   aspirin EC 81 MG tablet Patient has not taken in last 30 days   clopidogrel (PLAVIX) 75 MG tablet Patient has not taken in last 30 days   FLUoxetine (PROZAC) 40 MG capsule Patient has not taken in last 30 days   carvedilol (COREG) tablet 6.25 mg    sodium chloride (hypertonic) 3 % solution    etomidate (AMIDATE) injection    rocuronium (ZEMURON) injection    pantoprazole (PROTONIX) injection 40 mg    amLODipine (NORVASC) tablet 5 mg    free water 200 mL    0.9 %  sodium chloride infusion    feeding supplement (VITAL 1.5 CAL) liquid 1,000 mL    feeding supplement (PROSource TF) liquid 45 mL    free water 200 mL    free water 200 mL    insulin detemir (LEVEMIR) injection 20 Units    0.9 %  sodium chloride infusion    ketamine HCl 50 MG/5ML SOSY Returned to ADS   rocuronium bromide 100 MG/10ML SOSY Returned to ADS   midazolam (VERSED) 2 MG/2ML injection Returned to ADS   etomidate (AMIDATE) 2 MG/ML injection Returned to ADS   fentaNYL (SUBLIMAZE) 50  MCG/ML injection Returned to ADS   chlorhexidine gluconate (MEDLINE KIT) (PERIDEX) 0.12 % solution 15 mL    MEDLINE mouth rinse    fentaNYL (SUBLIMAZE) injection 50-200 mcg    clevidipine (CLEVIPREX) infusion 0.5 mg/mL    carvedilol (COREG) tablet 12.5 mg    docusate (COLACE) 50 MG/5ML liquid 100 mg    polyethylene glycol (MIRALAX / GLYCOLAX) packet 17 g    senna-docusate (Senokot-S)  tablet 1 tablet    levETIRAcetam (KEPPRA) 100 MG/ML solution 250 mg     Social History:  has no history on file for tobacco use, alcohol use, and drug use.  Family History:  No family history on file.  Review of systems not obtained due to patient factors. Weight change:   Intake/Output Summary (Last 24 hours) at 06/06/2021 1402 Last data filed at 06/06/2021 0508 Gross per 24 hour  Intake 15.46 ml  Output 425 ml  Net -409.54 ml   BP (!) 168/90   Pulse 71   Temp 98.1 F (36.7 C)   Resp 20   Ht 5' 3"  (1.6 m)   Wt 78 kg   SpO2 98%   BMI 30.46 kg/m  Vitals:   06/06/21 1000 06/06/21 1100 06/06/21 1200 06/06/21 1300  BP: (!) 167/95 (!) 169/86 (!) 160/84 (!) 168/90  Pulse: 70 79 73 71  Resp: 12 14 14 20   Temp:  98.1 F (36.7 C)    TempSrc:      SpO2: 98% 97% 98% 98%  Weight:      Height:         General appearance: Not responding to voice or tactile stimuli Head: Normocephalic, without obvious abnormality, atraumatic Resp: rhonchi bilaterally Cardio: regular rate and rhythm, S1, S2 normal, no murmur, click, rub or gallop GI: soft, non-tender; bowel sounds normal; no masses,  no organomegaly Extremities: extremities normal, atraumatic, no cyanosis or edema  Labs: Basic Metabolic Panel: Recent Labs  Lab 05/31/21 1600 05/31/21 1609 05/31/21 1819 05/31/21 1902 05/31/21 2101 06/01/21 0448 06/01/21 1458 06/02/21 0052 06/02/21 0956 06/02/21 1700 06/02/21 2218 06/03/21 0652 06/04/21 0345 06/05/21 0452 06/06/21 0341  NA 142 144 149* 143   < > 153*   < > 162* 160* 157* 159* 152*  155* 150* 151*  K 3.8 3.9 3.5  --   --  3.9  --  3.9  --   --   --  3.5 3.7 4.2 4.7  CL 112* 115*  --   --   --  126*  --  >130*  --   --   --  129* 130* 127* 126*  CO2 14*  --   --   --   --  14*  --  16*  --   --   --  17* 18* 14* 13*  GLUCOSE 193* 188*  --   --   --  214*  --  222*  --   --   --  229* 256* 209* 131*  BUN 59* 56*  --   --   --  63*  --  74*  --   --   --  82* 89* 96* 107*  CREATININE 6.65* 6.90*  --   --   --  6.76*  --  7.17*  --   --   --  7.38* 7.69* 7.56* 7.62*  ALBUMIN 3.2*  --   --   --   --   --   --   --   --   --   --   --   --   --   --   CALCIUM 9.1  --   --   --   --  8.3*  --  8.1*  --   --   --  8.2* 8.1* 7.8* 8.5*  PHOS  --   --   --  5.3*  --   --   --  4.8*  --   --   --   --   --   --   --    < > =  values in this interval not displayed.   Liver Function Tests: Recent Labs  Lab 05/31/21 1600  AST 61*  ALT 25  ALKPHOS 68  BILITOT 0.8  PROT 6.8  ALBUMIN 3.2*   No results for input(s): LIPASE, AMYLASE in the last 168 hours. No results for input(s): AMMONIA in the last 168 hours. CBC: Recent Labs  Lab 05/31/21 1600 05/31/21 1609 06/03/21 0652 06/04/21 0345 06/05/21 0452 06/06/21 0341  WBC 14.9*   < > 13.5* 11.6* 12.6* 12.8*  NEUTROABS 12.6*  --   --   --   --   --   HGB 13.6   < > 8.6* 9.1* 9.6* 10.3*  HCT 40.9   < > 27.3* 28.4* 30.3* 33.0*  MCV 88.0   < > 91.9 91.9 91.8 93.2  PLT 176   < > 93* 97* 94* 123*   < > = values in this interval not displayed.   PT/INR: @LABRCNTIP (inr:5) Cardiac Enzymes: )No results for input(s): CKTOTAL, CKMB, CKMBINDEX, TROPONINI in the last 168 hours. CBG: Recent Labs  Lab 06/05/21 2014 06/05/21 2356 06/06/21 0404 06/06/21 0919 06/06/21 1132  GLUCAP 151* 142* 118* 93 119*    Iron Studies: No results for input(s): IRON, TIBC, TRANSFERRIN, FERRITIN in the last 168 hours.  Xrays/Other Studies: DG Abd Portable 1V  Result Date: 06/05/2021 CLINICAL DATA:  Feeding tube placement EXAM: PORTABLE  ABDOMEN - 1 VIEW COMPARISON:  08/17/2020 FINDINGS: Feeding tube with weighted tip has been advanced to the gastric antrum. The stomach is decompressed. Visualized bowel gas pattern is normal. The lower abdomen is excluded. IMPRESSION: Feeding tube to the gastric antrum Electronically Signed   By: Lucrezia Europe M.D.   On: 06/05/2021 10:36     Assessment/Plan:  AKI/CKD stage IV - in setting of hypertensive urgency/emergency.  I spoke with Dr. Joylene Grapes who confirmed his previous discussions with the family and is in agreement that she is not a suitable candidate for dialysis.  I have personally reviewed her history and agree with his assessment.  Recommend palliative care to help transition to comfort measures if her renal function continues to deteriorate.  Currently DNR. Hypertensive emergency -s /p ICH with mass effect in right basal ganglia and cerebral edema.  BP's stable. Large right BG, ICH with IVH and [facility] following.  Off of plavix and aspirin Cerebral edema - treated with 3% saline and now off.  CT scan pending Hypernatremia - following hypertonic saline infusion.  Improving.  Severe dementia - poor cognitive function at baseline, now worse following ICH. H/o CVA - off of plavix and asa due to [place], neuro following Disposition - poor overall prognosis.  Agree with Palliative care consult to help transition to comfort measures.  Nothing further to add and will sign off.  Please call with questions or concerns.     Governor [place] Vaishnav Demartin 06/06/2021, 2:02 PM

## 2021-06-06 NOTE — Progress Notes (Signed)
Physical Therapy Treatment Patient Details Name: Christina Nunez MRN: [id number] DOB: Nov 19, 1955 Today's Date: 06/06/2021   History of Present Illness Pt is 65 yo female who presents after being found unresponsive with large R ICH, SAH, cerebral edema, seizure, AKI on CKD. PMH:  HTN, mild dementia, depression/anxiety, and likely CABG (sternotomy wires present on CXR), L CVA in 2021, CKD, pre DM, HLD.    PT Comments    Patient with eyes closed throughout session but nodding yes/no appropriately and following commands. Patient requires totalA+2 for bed mobility with no initiation by patient. No movement noted in L LE throughout session. Updated recommendation to SNF at discharge as patient may not tolerate intensity level of CIR.     Recommendations for follow up therapy are one component of a multi-disciplinary discharge planning process, led by the attending physician.  Recommendations may be updated based on patient status, additional functional criteria and insurance authorization.  Follow Up Recommendations  SNF     Equipment Recommendations  Other (comment) (TBD)    Recommendations for Other Services       Precautions / Restrictions Precautions Precautions: Fall Restrictions Weight Bearing Restrictions: No     Mobility  Bed Mobility Overal bed mobility: Needs Assistance Bed Mobility: Supine to Sit Rolling: Total assist;+2 for physical assistance;+2 for safety/equipment         General bed mobility comments: total A +2 to transition to EOB using bed pad    Transfers                 General transfer comment: bed placed in chair position,  Ambulation/Gait                 Stairs             Wheelchair Mobility    Modified Rankin (Stroke Patients Only)       Balance Overall balance assessment: Needs assistance Sitting-balance support: Feet supported;No upper extremity supported Sitting balance-Leahy Scale: Zero                                       Cognition Arousal/Alertness: Lethargic Behavior During Therapy: Flat affect Overall Cognitive Status: Difficult to assess                                 General Comments: pt keeps eyes closed throughout but responds to stimulation and nods head yes and no appropriately to simple questions      Exercises Other Exercises Other Exercises: gentle passive LUE ROM Other Exercises: L rotation cervical ROM    General Comments        Pertinent Vitals/Pain Pain Assessment: Faces Faces Pain Scale: Hurts little more Pain Location: general discomfort Pain Descriptors / Indicators: Discomfort Pain Intervention(s): Limited activity within patient's tolerance    Home Living Family/patient expects to be discharged to:: Private residence Living Arrangements: Other relatives Available Help at Discharge: Family;Available PRN/intermittently Type of Home: Apartment Home Access: Stairs to enter   Home Layout: Two level;Bed/bath upstairs   Additional Comments: info from chart review but pt was able to confirm that she still lives with her sister in an apt.    Prior Function Level of Independence: Needs assistance      Comments: lived at home with family; having [place] services   PT Goals (current goals can now be found in  the care plan section) Acute Rehab PT Goals Patient Stated Goal: pt unable to state PT Goal Formulation: Patient unable to participate in goal setting Time For Goal Achievement: 06/18/21 Potential to Achieve Goals: Fair Progress towards PT goals: Not progressing toward goals - comment    Frequency    Min 3X/week      PT Plan Current plan remains appropriate    Co-evaluation              AM-PAC PT "6 Clicks" Mobility   Outcome Measure  Help needed turning from your back to your side while in a flat bed without using bedrails?: Total Help needed moving from lying on your back to sitting on the side of a flat bed  without using bedrails?: Total Help needed moving to and from a bed to a chair (including a wheelchair)?: Total Help needed standing up from a chair using your arms (e.g., wheelchair or bedside chair)?: Total Help needed to walk in hospital room?: Total Help needed climbing 3-5 steps with a railing? : Total 6 Click Score: 6    End of Session   Activity Tolerance: Patient limited by lethargy Patient left: in bed;with call bell/phone within reach;with bed alarm set Nurse Communication: Mobility status PT Visit Diagnosis: Muscle weakness (generalized) (M62.81);Hemiplegia and hemiparesis;Pain;Difficulty in walking, not elsewhere classified (R26.2) Hemiplegia - Right/Left: Left Hemiplegia - dominant/non-dominant: Non-dominant Hemiplegia - caused by: Other Nontraumatic intracranial hemorrhage     Time: 1218-1236 PT Time Calculation (min) (ACUTE ONLY): 18 min  Charges:  $Therapeutic Activity: 8-22 mins                     Brigitte Soderberg A. Gilford Rile PT, DPT Acute Rehabilitation Services Pager [phone] Office [phone]    Linna Hoff 06/06/2021, 1:46 PM

## 2021-06-06 NOTE — Progress Notes (Signed)
NAME:  Christina Nunez, MRN:  [id number], DOB:  21-May-1956, LOS: 6 ADMISSION DATE:  05/31/2021, CONSULTATION DATE: 10/14 REFERRING MD: Dr. Theda Sers, CHIEF COMPLAINT: Stroke  History of Present Illness:  Patient is a 65 year old female with PMH of ischemic stroke, HTN, HLD, CKD IV, mild dementia, pre-DM presents to [facility] ED on 10/14 for unresponsiveness.  Patient's last known normal at 7:50 AM on 10/14.  [place] RN was unable to get into house today, family called and patient was found unresponsive next to bed.  EMS arrived and patient had seizure like activity in route to [facility] ED.  2.5 mg of Versed given.  Upon arrival to [facility] ED 10/14, patient was intubated for airway protection.  BP was high.  CT head: Large parenchymal hemorrhage within right basal ganglia region and surrounding white matter; mass-effect with 8 mm leftward midline shift without ventricle entrapment. Neuro consulted.  Hypertonic saline started. Started on Cleviprex. Patient admitted to ICU.  PCCM consulted for management of vent while intubated and medical management.  Pertinent  Medical History   Past Medical History:  Diagnosis Date   Hypertension    Stroke ([place])    Significant Hospital Events: Including procedures, antibiotic start and stop dates in addition to other pertinent events   10/14: adm with RT ICH with IVH & SAH seizure per EMS ,on cleviprex gtt 10/14 cardiology consulted for high troponin 18 K 10/15 MRI stable hematoma, SAH, IVH with midline shift 8 mm 10/16 EEG shows no evidence of seizure activity.  Interim History / Subjective:   Extubated yesterday. No distress overnight.   Objective   Blood pressure (!) 168/82, pulse 69, temperature 98.7 F (37.1 C), temperature source Oral, resp. rate (!) 25, height '5\' 3"'$  (1.6 m), weight 78 kg, SpO2 99 %.    FiO2 (%):  [21 %] 21 %   Intake/Output Summary (Last 24 hours) at 06/06/2021 1640 Last data filed at 06/06/2021 0508 Gross per 24 hour  Intake --  Output 425  ml  Net -425 ml    Filed Weights   05/31/21 1600 06/02/21 0500 06/04/21 0500  Weight: 70 kg 75.3 kg 78 kg    Examination: General:  calm extubated HEENT: MM pink/moist;  Neuro: Follows one-step commands with good strength on the right side.  No response to pain on the left.  Able to swallow to command.  Nods appropriately to questions. Still not opening eyes CV: s1s2, RRR, no m/r/g;  PULM: Clear breath sounds bilateral, no accessory muscle use, snoring at times.  GI: soft, bsx4 active  Extremities: warm/dry, no edema  Skin: no rashes or lesions   Labs: Creatinine remains elevated  Resolved Hospital Problem list     Assessment & Plan:  Critically ill due to acute respiratory failure with hypoxia and hypercarbia related to large R intracerebral hemorrhage in R basal ganglia w/ 8 mm leftward shift from cerebral edema. Hypertensive urgency Hypernatremia, iatrogenic and secondary to insensitive losses.  AKI on CKD IV , rising creatinine Deemed not a candidate for dialysis by her outpatient nephrology Possible aspiration pneumonia Type 2 diabetes with hyperglycemia Hx of depression  Baseline function is poor and patient is not a candidate for long-term hemodialysis.  Best hope would be to tolerate one-way trial of extubation.  Plan:  -Tolerated extubation -Free water to correct hypernatremia. May help mentation further.  -Consider changing Keppra as may be contributing to sedation - discussed with Dr Erlinda Hong.  - Palliative care following - Comfort care if condition deteriorates.  -  Nephrology has confirmed prior plan not to commit the patient to HD due to severe dementia, now compounded by recent ICH. - If remains comfortable tomorrow can transition to floor and PCCM will sign off then.   Best Practice (right click and "Reselect all SmartList Selections" daily)   Diet/type: tubefeeds and NPO w/ meds via tube DVT prophylaxis: SCD GI prophylaxis: PPI Lines: N/A Foley:  Yes, and  it is still needed Code Status:  full code Last date of multidisciplinary goals of care discussion [per primary]  Labs   CBC: Recent Labs  Lab 05/31/21 1600 05/31/21 1609 06/02/21 0052 06/03/21 0652 06/04/21 0345 06/05/21 0452 06/06/21 0341  WBC 14.9*   < > 14.0* 13.5* 11.6* 12.6* 12.8*  NEUTROABS 12.6*  --   --   --   --   --   --   HGB 13.6   < > 8.8* 8.6* 9.1* 9.6* 10.3*  HCT 40.9   < > 28.7* 27.3* 28.4* 30.3* 33.0*  MCV 88.0   < > 92.9 91.9 91.9 91.8 93.2  PLT 176   < > 94* 93* 97* 94* 123*   < > = values in this interval not displayed.     Basic Metabolic Panel: Recent Labs  Lab 05/31/21 1902 05/31/21 2101 06/02/21 0052 06/02/21 0956 06/02/21 2218 06/03/21 [id number] 06/04/21 0345 06/05/21 0452 06/06/21 0341  NA 143   < > 162*   < > 159* 152* 155* 150* 151*  K  --    < > 3.9  --   --  3.5 3.7 4.2 4.7  CL  --    < > >130*  --   --  129* 130* 127* 126*  CO2  --    < > 16*  --   --  17* 18* 14* 13*  GLUCOSE  --    < > 222*  --   --  229* 256* 209* 131*  BUN  --    < > 74*  --   --  82* 89* 96* 107*  CREATININE  --    < > 7.17*  --   --  7.38* 7.69* 7.56* 7.62*  CALCIUM  --    < > 8.1*  --   --  8.2* 8.1* 7.8* 8.5*  MG 2.2  --  2.0  --   --   --   --   --   --   PHOS 5.3*  --  4.8*  --   --   --   --   --   --    < > = values in this interval not displayed.    GFR: Estimated Creatinine Clearance: 7.4 mL/min (A) (by C-G formula based on SCr of 7.62 mg/dL (H)). Recent Labs  Lab 05/31/21 1902 06/01/21 0448 06/02/21 0052 06/03/21 0652 06/04/21 0345 06/05/21 0452 06/06/21 0341  WBC  --  20.9*   < > 13.5* 11.6* 12.6* 12.8*  LATICACIDVEN 3.1* 1.8  --   --   --   --   --    < > = values in this interval not displayed.     Liver Function Tests: Recent Labs  Lab 05/31/21 1600  AST 61*  ALT 25  ALKPHOS 68  BILITOT 0.8  PROT 6.8  ALBUMIN 3.2*    No results for input(s): LIPASE, AMYLASE in the last 168 hours. No results for input(s): AMMONIA in the last  168 hours.  ABG    Component Value Date/Time  PHART 7.321 (L) 05/31/2021 1819   PCO2ART 30.5 (L) 05/31/2021 1819   PO2ART 95 05/31/2021 1819   HCO3 15.7 (L) 05/31/2021 1819   TCO2 17 (L) 05/31/2021 1819   ACIDBASEDEF 9.0 (H) 05/31/2021 1819   O2SAT 97.0 05/31/2021 1819      Coagulation Profile: Recent Labs  Lab 05/31/21 1600  INR 1.1     Cardiac Enzymes: No results for input(s): CKTOTAL, CKMB, CKMBINDEX, TROPONINI in the last 168 hours.  HbA1C: Hgb A1c MFr Bld  Date/Time Value Ref Range Status  05/31/2021 07:02 PM 6.0 (H) 4.8 - 5.6 % Final    Comment:    (NOTE) Pre diabetes:          5.7%-6.4%  Diabetes:              >6.4%  Glycemic control for   <7.0% adults with diabetes   08/13/2020 04:18 PM 6.3 (H) 4.8 - 5.6 % Final    Comment:    (NOTE) Pre diabetes:          5.7%-6.4%  Diabetes:              >6.4%  Glycemic control for   <7.0% adults with diabetes     CBG: Recent Labs  Lab 06/05/21 2356 06/06/21 0404 06/06/21 0919 06/06/21 1132 06/06/21 [place] Lyall Faciane, [place] ICU Physician [place]  Pager: [phone] Mobile: [phone] After hours: [phone].

## 2021-06-06 NOTE — Evaluation (Signed)
Clinical/Bedside Swallow Evaluation Patient Details  Name: Christina Nunez MRN: [id number] Date of Birth: 1955-11-02  Today's Date: 06/06/2021 Time: SLP Start Time (ACUTE ONLY): 1033 SLP Stop Time (ACUTE ONLY): 1048 SLP Time Calculation (min) (ACUTE ONLY): 15 min  Past Medical History:  Past Medical History:  Diagnosis Date   Hypertension    Stroke [facility])    Past Surgical History: History reviewed. No pertinent surgical history. HPI:  Pt is 65 yo female who presents after being found unresponsive with large R ICH, SAH, cerebral edema, seizure, AKI on CKD. Pt intubated 10/14-10/19 (one way extubation). PMH:  HTN, mild dementia, depression/anxiety, and likely CABG (sternotomy wires present on CXR), L CVA in 2021, CKD, pre DM, HLD.    Assessment / Plan / Recommendation  Clinical Impression  Pt was seen for a bedside swallow evaluation. Pt with increased WOB at baseline. Pt awake but lethargic with eyes closed. SLP completed oral care and oral mechanism exam, noting general oral weakness; however, pt not responding to commands necessary for oral mech exam completion. SLP trialed ice chips x2; on first presentation, pt exhibited anterior oral spillage with intact ice chip being expelled quickly. SLP attempted to rouse pt further using wet washcloth on chest and face before second presentation of ice chip, resulting in swallowing of bolus followed by immediate cough and audible respirations requiring suction. Recommend NPO, administering medications via alternative means; SLP will follow for PO trials as pt status increases. SLP Visit Diagnosis: Dysphagia, unspecified (R13.10)    Aspiration Risk  Severe aspiration risk    Diet Recommendation NPO   Medication Administration: Via alternative means    Other  Recommendations Oral Care Recommendations: Oral care QID    Recommendations for follow up therapy are one component of a multi-disciplinary discharge planning process, led by the attending  physician.  Recommendations may be updated based on patient status, additional functional criteria and insurance authorization.  Follow up Recommendations Other (comment) (tbd)      Frequency and Duration min 2x/week  2 weeks       Prognosis Prognosis for Safe Diet Advancement: Fair Barriers to Reach Goals: Severity of deficits      Swallow Study   General Date of Onset: 05/31/21 HPI: Pt is 65 yo female who presents after being found unresponsive with large R ICH, SAH, cerebral edema, seizure, AKI on CKD. Pt intubated 10/14-10/19 (one way extubation). PMH:  HTN, mild dementia, depression/anxiety, and likely CABG (sternotomy wires present on CXR), L CVA in 2021, CKD, pre DM, HLD. Type of Study: Bedside Swallow Evaluation Previous Swallow Assessment: n/a Diet Prior to this Study: NPO Temperature Spikes Noted: No Respiratory Status: Room air History of Recent Intubation: Yes Length of Intubations (days): 5 days Date extubated: 06/05/21 Behavior/Cognition: Lethargic/Drowsy;Distractible;Requires cueing Oral Cavity Assessment: Within Functional Limits Oral Care Completed by SLP: Yes Oral Cavity - Dentition: Adequate natural dentition Vision:  (Pt keeps eyes closed) Self-Feeding Abilities: Total assist Patient Positioning: Upright in bed Baseline Vocal Quality: Not observed Volitional Cough: Cognitively unable to elicit Volitional Swallow: Unable to elicit    Oral/Motor/Sensory Function Overall Oral Motor/Sensory Function: Generalized oral weakness Facial ROM: Reduced right;Reduced left Facial Symmetry:  (unable to assess) Facial Strength: Reduced right;Reduced left Facial Sensation: Other (Comment) (unable to assess) Lingual ROM: Other (Comment) (unable to assess) Lingual Symmetry: Other (Comment) (unable to assess) Lingual Strength: Other (Comment) (unable to assess) Lingual Sensation: Other (Comment) (unable to assess) Velum: Other (comment) (unable to assess) Mandible:  Other (Comment) (unable to assess)  Ice Chips Ice chips: Impaired Presentation: Spoon Oral Phase Impairments: Reduced labial seal;Poor awareness of bolus Oral Phase Functional Implications: Right anterior spillage;Left anterior spillage Pharyngeal Phase Impairments: Cough - Immediate;Other (comments) (audible respirations)   Thin Liquid Thin Liquid: Not tested    Nectar Thick Nectar Thick Liquid: Not tested   Honey Thick Honey Thick Liquid: Not tested   Puree Puree: Not tested   Solid     Solid: Not tested     Dewitt Rota, SLP-Student  Dewitt Rota 06/06/2021,12:25 PM

## 2021-06-07 DIAGNOSIS — N186 End stage renal disease: Secondary | ICD-10-CM | POA: Diagnosis not present

## 2021-06-07 DIAGNOSIS — J9601 Acute respiratory failure with hypoxia: Secondary | ICD-10-CM | POA: Diagnosis not present

## 2021-06-07 DIAGNOSIS — I629 Nontraumatic intracranial hemorrhage, unspecified: Secondary | ICD-10-CM | POA: Diagnosis not present

## 2021-06-07 DIAGNOSIS — Z7189 Other specified counseling: Secondary | ICD-10-CM | POA: Diagnosis not present

## 2021-06-07 DIAGNOSIS — I609 Nontraumatic subarachnoid hemorrhage, unspecified: Secondary | ICD-10-CM | POA: Diagnosis not present

## 2021-06-07 DIAGNOSIS — R569 Unspecified convulsions: Secondary | ICD-10-CM

## 2021-06-07 LAB — GLUCOSE, CAPILLARY
Glucose-Capillary: 193 mg/dL — ABNORMAL HIGH (ref 70–99)
Glucose-Capillary: 203 mg/dL — ABNORMAL HIGH (ref 70–99)
Glucose-Capillary: 213 mg/dL — ABNORMAL HIGH (ref 70–99)
Glucose-Capillary: 216 mg/dL — ABNORMAL HIGH (ref 70–99)
Glucose-Capillary: 230 mg/dL — ABNORMAL HIGH (ref 70–99)
Glucose-Capillary: 235 mg/dL — ABNORMAL HIGH (ref 70–99)

## 2021-06-07 MED ORDER — HYDRALAZINE HCL 25 MG PO TABS
25.0000 mg | ORAL_TABLET | Freq: Four times a day (QID) | ORAL | Status: DC | PRN
  Administered 2021-06-07 – 2021-06-08 (×2): 25 mg
  Filled 2021-06-07 (×2): qty 1

## 2021-06-07 MED ORDER — HYDRALAZINE HCL 25 MG PO TABS
25.0000 mg | ORAL_TABLET | Freq: Four times a day (QID) | ORAL | Status: DC | PRN
  Filled 2021-06-07: qty 1

## 2021-06-07 MED ORDER — INSULIN DETEMIR 100 UNIT/ML ~~LOC~~ SOLN
20.0000 [IU] | Freq: Two times a day (BID) | SUBCUTANEOUS | Status: DC
  Administered 2021-06-07 – 2021-06-08 (×2): 20 [IU] via SUBCUTANEOUS
  Filled 2021-06-07 (×3): qty 0.2

## 2021-06-07 NOTE — Progress Notes (Signed)
STROKE TEAM PROGRESS NOTE   INTERVAL HISTORY RN is at bedside.  Patient lying in bed, still has respite distress, however neuro stable, attempted open eyes on voice, follow commands on the right hand and foot.  Nephrology consulted yesterday and agree with not candidate for dialysis.  Palliative care on board.   Vitals:   06/07/21 0845 06/07/21 1000 06/07/21 1030 06/07/21 1100  BP: (!) 159/86 (!) 163/73 (!) 157/76 (!) 159/92  Pulse: 81 74 78 80  Resp: 19 17 16 18   Temp:      TempSrc:      SpO2: 98% 99% 99% 97%  Weight:      Height:       CBC:  Recent Labs  Lab 05/31/21 1600 05/31/21 1609 06/05/21 0452 06/06/21 0341  WBC 14.9*   < > 12.6* 12.8*  NEUTROABS 12.6*  --   --   --   HGB 13.6   < > 9.6* 10.3*  HCT 40.9   < > 30.3* 33.0*  MCV 88.0   < > 91.8 93.2  PLT 176   < > 94* 123*   < > = values in this interval not displayed.   Basic Metabolic Panel:  Recent Labs  Lab 05/31/21 1902 05/31/21 2101 06/02/21 0052 06/02/21 0956 06/05/21 0452 06/06/21 0341  NA 143   < > 162*   < > 150* 151*  K  --    < > 3.9   < > 4.2 4.7  CL  --    < > >130*   < > 127* 126*  CO2  --    < > 16*   < > 14* 13*  GLUCOSE  --    < > 222*   < > 209* 131*  BUN  --    < > 74*   < > 96* 107*  CREATININE  --    < > 7.17*   < > 7.56* 7.62*  CALCIUM  --    < > 8.1*   < > 7.8* 8.5*  MG 2.2  --  2.0  --   --   --   PHOS 5.3*  --  4.8*  --   --   --    < > = values in this interval not displayed.   Lipid Panel:  Recent Labs  Lab 05/31/21 1902 06/01/21 0448  CHOL 156  --   TRIG 179* 184*  HDL 33*  --   CHOLHDL 4.7  --   VLDL 36  --   LDLCALC 87  --    HgbA1c:  Recent Labs  Lab 05/31/21 1902  HGBA1C 6.0*   Urine Drug Screen: No results for input(s): LABOPIA, COCAINSCRNUR, LABBENZ, AMPHETMU, THCU, LABBARB in the last 168 hours.  Alcohol Level  Recent Labs  Lab 05/31/21 1902  ETH <10    IMAGING past 24 hours CT HEAD WO CONTRAST (5MM)  Result Date: 06/06/2021 CLINICAL DATA:   Intracranial hemorrhage, follow-up EXAM: CT HEAD WITHOUT CONTRAST TECHNIQUE: Contiguous axial images were obtained from the base of the skull through the vertex without intravenous contrast. COMPARISON:  Recent CT and MR imaging FINDINGS: Brain: Large hemorrhage again identified within the right cerebral hemisphere centered within the basal ganglia and surrounding white matter. Right greater than left cerebral sulcal subarachnoid hemorrhage is again identified. Intraventricular hemorrhage is also again noted. There has been no substantial change allowing for some interval evolution and redistribution. Edema surrounding the parenchymal hemorrhage is similar as is the resulting  mass effect. Persistent effacement of right lateral ventricle with leftward midline shift still measuring approximately 8 mm. There is no trapping of the left lateral ventricle. There is no new loss of gray-white differentiation. Vascular: There is atherosclerotic calcification at the skull base. Skull: Calvarium is unremarkable. Sinuses/Orbits: No acute finding. Other: None. IMPRESSION: No substantial change since most recent imaging in large parenchymal hemorrhage centered within the basal ganglia with sulcal and intraventricular subarachnoid extension. There is no hydrocephalus. Leftward midline shift remains 8 mm. Electronically Signed   By: Macy Mis M.D.   On: 06/06/2021 17:27    PHYSICAL EXAM  Temp:  [98 F (36.7 C)-99.1 F (37.3 C)] 99.1 F (37.3 C) (10/21 0800) Pulse Rate:  [59-84] 80 (10/21 1100) Resp:  [14-30] 18 (10/21 1100) BP: (116-175)/(65-122) 159/92 (10/21 1100) SpO2:  [96 %-100 %] 97 % (10/21 1100) Weight:  [80.1 kg] 80.1 kg (10/21 0500)  General - Well nourished, well developed, moderate to severe respiratory distress.  Ophthalmologic - fundi not visualized due to noncooperation.  Cardiovascular - Regular rate and rhythm.  Neuro - moderate to severe respiratory distress off extubation, eyes closed,  not open on voice, with forced eye opening, she had right gaze, not able to have left gaze. Not blinking to visual threat bilaterally, right pupil 2.1mm, left pupil 59mm, sluggish to light. Corneal reflex present, gag and cough present. Left facial droop.  Tongue protrusion not cooperative. Follows simple commands on the right hand and foot, no movement of left UE and LE. RUE proximal 3/5 and finger grip 3/5. RLE 2+/5 proximal and toe DF/PF 3/5. DTR 1+ and no babinski. Sensation, coordination not cooperative and gait not tested.   ASSESSMENT/PLAN Christina Nunez is a 65 yo female with a PMHx of mild dementia, CKD IV, pre DM, HLD, ischemic stroke, HTN, and depression admitted for unresponsiveness. Patient had seizure activity en route and was given Versed 2.5mg  per EMS.   ICH - right BG large ICH with IVH and SAH, likely due to hypertensive emergency [place] - large area of parenchymal hemorrhage centered within the right basal ganglia region. Subarachnoid extention also present with intraventricular extension (ICH 4). 62mm leftward midline shift.  CTA head & neck No AVM or aneurysm CT repeat stable hematoma, midline shift, no hydrocephalus MRI brain stable hematoma, SAH, IVH and midline shift 8 mm CT repeat 10/20 unchanged ICH and IVH as well as midline shift 2D Echo EF 60 to 65% EEG no seizure but encephalopathy LDL 128 HgbA1c 6.3 VTE prophylaxis - SCDs aspirin 81 mg daily and clopidogrel 75 mg daily prior to admission, now on No antithrombotic given ICH Therapy recommendations: Pending Disposition: Pending, palliative care on board, ongoing [place] discussion with sister  Cerebral edema CT and MRI showed stable midline shift 8 mm On 3% saline @ 75 -> FW 200 Q4h ->NS @ 50->off Na 142-149-153-163-163-160->157->152->155->150->151 Allow Na trending down gradually CT repeat unchanged ICH and IVH as well as midline shift  Respiratory failure Intubated on ventilation Stable mental status CCM on  board Extubated on 10/19 -> moderate to severe respiratory distress -> sister decided no reintubation -> code status DNR Palliative care on board  AKI on CKD  Cre 6.65->6.90->6.76->7.17->7.38->7.69->7.56->7.62 Baseline around 4 in 08/2020 Per sister, patient deemed not a candidate for dialysis by her nephrologist D/c IVF, on TF Continue to be oligouria  CCM on board Nephrology consulted and agree with not candidate for dialysis  Seizure Witnessed seizure with EMS, stat post Versed 2.5 mg Currently  no clinical seizure EEG no seizure but encephalopathy Keppra 250 twice daily->d/c due to potential sedative effect and no more clinical seizure seen  History of stroke 07/2020 admitted for right CR/SO lacunar infarct on MRI.  CT no acute abnormality.  MRI also showed old left cerebellum, left BG and thalamic, bilateral SO and CR infarcts.  MRI head and neck bilateral siphon moderate atherosclerosis, left more than right.  DVT negative.  EF 50 to 55%.  LDL 128, A1c 6.3.  Patient discharged with DAPT and Lipitor 40.  Hypertensive emergency Home meds: Lasix, Coreg 6.25 Stable, off Cleviprex Labetalol IV PRN On amlodipine 5->10 and Coreg 12.5->25 Long-term BP goal normotensive  Hyperlipidemia Home meds: Lipitor 40,  LDL 128, goal < 70 May consider statin at discharge  Dysphagia On tube feeding @ 60  Other Stroke Risk Factors CAD status post CABG  Other Active Problems Leukocytosis WBC 20.9->11.6 Anemia of chronic disease, hemoglobin 11.1->8.8->8.6->9.1->9.6->10.3  Hospital day # 7  This patient is critically ill due to moderate to severe respiratory failure, large right BG hemorrhage, IVH, cerebral edema, SAH, end-stage renal failure and at significant risk of neurological worsening, death form respiratory failure, renal failure, seizure, cerebral edema and brain herniation. This patient's care requires constant monitoring of vital signs, hemodynamics, respiratory and cardiac  monitoring, review of multiple databases, neurological assessment, discussion with family, other specialists and medical decision making of high complexity. I spent 40 minutes of neurocritical care time in the care of this patient.  Rosalin Hawking, MD PhD Stroke Neurology 06/07/2021 12:02 PM   To contact Stroke Continuity provider, please refer to [email or web address] After hours, contact General Neurology

## 2021-06-07 NOTE — Progress Notes (Signed)
NAME:  Christina Nunez, MRN:  [id number], DOB:  [birth year], LOS: 7 ADMISSION DATE:  05/31/2021, CONSULTATION DATE: 10/14 REFERRING MD: Dr. Theda Sers, CHIEF COMPLAINT: Stroke  History of Present Illness:  Patient is a 65 year old female with PMH of ischemic stroke, HTN, HLD, CKD IV, mild dementia, pre-DM presents to [facility] ED on 10/14 for unresponsiveness.  Patient's last known normal at 7:50 AM on 10/14.  [place] RN was unable to get into house today, family called and patient was found unresponsive next to bed.  EMS arrived and patient had seizure like activity in route to [facility] ED.  2.5 mg of Versed given.  Upon arrival to [facility] ED 10/14, patient was intubated for airway protection.  BP was high.  CT head: Large parenchymal hemorrhage within right basal ganglia region and surrounding white matter; mass-effect with 8 mm leftward midline shift without ventricle entrapment. Neuro consulted.  Hypertonic saline started. Started on Cleviprex. Patient admitted to ICU.  PCCM consulted for management of vent while intubated and medical management.  Pertinent  Medical History   Past Medical History:  Diagnosis Date   Hypertension    Stroke ([place])    Significant Hospital Events: Including procedures, antibiotic start and stop dates in addition to other pertinent events   10/14: adm with RT ICH with IVH & SAH seizure per EMS ,on cleviprex gtt 10/14 cardiology consulted for high troponin 18 K 10/15 MRI stable hematoma, SAH, IVH with midline shift 8 mm 10/16 EEG shows no evidence of seizure activity.  Interim History / Subjective:   Off vent No respiratory distress  Objective   Blood pressure (!) 167/81, pulse 83, temperature 98.8 F (37.1 C), temperature source Oral, resp. rate 17, height 5\' 3"  (1.6 m), weight 80.1 kg, SpO2 98 %.        Intake/Output Summary (Last 24 hours) at 06/07/2021 0819 Last data filed at 06/07/2021 0600 Gross per 24 hour  Intake 1845 ml  Output 525 ml  Net 1320 ml   Filed  Weights   06/02/21 0500 06/04/21 0500 06/07/21 0500  Weight: 75.3 kg 78 kg 80.1 kg    Examination: General: Unresponsive, HEENT: Moist oral mucosa Neuro: Not opening eyes, not very responsive this morning CV: S1-S2 appreciated PULM: Clear breath sounds bilaterally  GI: soft, bsx4 active  Extremities: warm/dry, no edema  Skin: no rashes or lesions   Labs: Creatinine remains elevated BUN elevated Elevated sodium  Resolved Hospital Problem list     Assessment & Plan:   Remains critically ill Large right intracerebral hemorrhage in the right basal ganglia with 8 mm leftward shift from cerebral edema Hypertensive urgency Hypernatremia  Acute kidney injury on chronic kidney disease -Not a dialysis candidate -This has been discussed extensively as outpatient  Aspiration pneumonia -Day 6 Augmentin  Type 2 diabetes with hyperglycemia -On SSI  History of depression  Did tolerate extubation and appears to be protecting airway at present  Plan:  .  Continue free water for hypernatremia .  Appreciate palliative care involvement .  Hemodialysis is not an option of treatment, clarified with nephrology .  Hemodynamic stable overnight .  Respiratory status stable overnight.   .  Consider transition to hospitalist service  Best Practice (right click and "Reselect all SmartList Selections" daily)   Diet/type: tubefeeds and NPO w/ meds via tube DVT prophylaxis: SCD GI prophylaxis: PPI Lines: N/A Foley:  Yes, and it is still needed Code Status:  full code Last date of multidisciplinary goals of care discussion [per primary]  Labs   CBC: Recent Labs  Lab 05/31/21 1600 05/31/21 1609 06/02/21 0052 06/03/21 0652 06/04/21 0345 06/05/21 0452 06/06/21 0341  WBC 14.9*   < > 14.0* 13.5* 11.6* 12.6* 12.8*  NEUTROABS 12.6*  --   --   --   --   --   --   HGB 13.6   < > 8.8* 8.6* 9.1* 9.6* 10.3*  HCT 40.9   < > 28.7* 27.3* 28.4* 30.3* 33.0*  MCV 88.0   < > 92.9 91.9 91.9  91.8 93.2  PLT 176   < > 94* 93* 97* 94* 123*   < > = values in this interval not displayed.    Basic Metabolic Panel: Recent Labs  Lab 05/31/21 1902 05/31/21 2101 06/02/21 0052 06/02/21 0956 06/02/21 2218 06/03/21 3474 06/04/21 0345 06/05/21 0452 06/06/21 0341  NA 143   < > 162*   < > 159* 152* 155* 150* 151*  K  --    < > 3.9  --   --  3.5 3.7 4.2 4.7  CL  --    < > >130*  --   --  129* 130* 127* 126*  CO2  --    < > 16*  --   --  17* 18* 14* 13*  GLUCOSE  --    < > 222*  --   --  229* 256* 209* 131*  BUN  --    < > 74*  --   --  82* 89* 96* 107*  CREATININE  --    < > 7.17*  --   --  7.38* 7.69* 7.56* 7.62*  CALCIUM  --    < > 8.1*  --   --  8.2* 8.1* 7.8* 8.5*  MG 2.2  --  2.0  --   --   --   --   --   --   PHOS 5.3*  --  4.8*  --   --   --   --   --   --    < > = values in this interval not displayed.   GFR: Estimated Creatinine Clearance: 7.5 mL/min (A) (by C-G formula based on SCr of 7.62 mg/dL (H)). Recent Labs  Lab 05/31/21 1902 06/01/21 0448 06/02/21 0052 06/03/21 0652 06/04/21 0345 06/05/21 0452 06/06/21 0341  WBC  --  20.9*   < > 13.5* 11.6* 12.6* 12.8*  LATICACIDVEN 3.1* 1.8  --   --   --   --   --    < > = values in this interval not displayed.    Liver Function Tests: Recent Labs  Lab 05/31/21 1600  AST 61*  ALT 25  ALKPHOS 68  BILITOT 0.8  PROT 6.8  ALBUMIN 3.2*   No results for input(s): LIPASE, AMYLASE in the last 168 hours. No results for input(s): AMMONIA in the last 168 hours.  ABG    Component Value Date/Time   PHART 7.321 (L) 05/31/2021 1819   PCO2ART 30.5 (L) 05/31/2021 1819   PO2ART 95 05/31/2021 1819   HCO3 15.7 (L) 05/31/2021 1819   TCO2 17 (L) 05/31/2021 1819   ACIDBASEDEF 9.0 (H) 05/31/2021 1819   O2SAT 97.0 05/31/2021 1819    Coagulation Profile: Recent Labs  Lab 05/31/21 1600  INR 1.1    Cardiac Enzymes: No results for input(s): CKTOTAL, CKMB, CKMBINDEX, TROPONINI in the last 168 hours.  HbA1C: Hgb A1c  MFr Bld  Date/Time Value Ref Range Status  05/31/2021 07:02 PM 6.0 (  H) 4.8 - 5.6 % Final    Comment:    (NOTE) Pre diabetes:          5.7%-6.4%  Diabetes:              >6.4%  Glycemic control for   <7.0% adults with diabetes   08/13/2020 04:18 PM 6.3 (H) 4.8 - 5.6 % Final    Comment:    (NOTE) Pre diabetes:          5.7%-6.4%  Diabetes:              >6.4%  Glycemic control for   <7.0% adults with diabetes     CBG: Recent Labs  Lab 06/06/21 1132 06/06/21 1545 06/06/21 1940 06/06/21 2326 06/07/21 0330  GLUCAP 119* 139* 192* 223* 230*   The patient is critically ill with multiple organ systems failure and requires high complexity decision making for assessment and support, frequent evaluation and titration of therapies, application of advanced monitoring technologies and extensive interpretation of multiple databases. Critical Care Time devoted to patient care services described in this note independent of APP/resident time (if applicable)  is 32 minutes.   Sherrilyn Rist MD [place] [facility] Personal pager: See Amion If unanswered, please page CCM On-call: [phone]

## 2021-06-07 NOTE — Progress Notes (Signed)
Pt sister remained at the bedside most of the evening, during which pt code status came into question. Had long discussion with pt sister [name] concerning pt code status. She stated she was not aware pt status was a DNR. She stated she did speak with Dr. Lucianne Muss concerning pt poor prognosis and that she felt the pt would not want a trach/peg. She also stated having had a long conversation with palliative care. Explained to [facility] as best possible what DNR status entails, and that if she felt this is not what the pt, her sister, would want then she would need to further discuss with the critical care team. Also advised [name] to discuss further with palliative team the best options for the pt given her current status and prior health history.

## 2021-06-07 NOTE — Progress Notes (Signed)
Physical Therapy Treatment Patient Details Name: Christina Nunez MRN: [id number] DOB: 1956-03-10 Today's Date: 06/07/2021   History of Present Illness Pt is 65 yo female who presents after being found unresponsive with large R ICH, SAH, cerebral edema, seizure, AKI on CKD. PMH:  HTN, mild dementia, depression/anxiety, and likely CABG (sternotomy wires present on CXR), L CVA in 2021, CKD, pre DM, HLD.    PT Comments    Continues to be lethargic and requiring totalA+2 to come to sitting EOB. Keeps eyes closed most of session with one instance of eye opening but with R gaze preference. Following commands intermittently on R side. Nodding yes and no appropriately. Continue to recommend SNF for ongoing Physical Therapy.       Recommendations for follow up therapy are one component of a multi-disciplinary discharge planning process, led by the attending physician.  Recommendations may be updated based on patient status, additional functional criteria and insurance authorization.  Follow Up Recommendations  SNF     Equipment Recommendations  Other (comment) (TBD)    Recommendations for Other Services       Precautions / Restrictions Precautions Precautions: Fall Restrictions Weight Bearing Restrictions: No     Mobility  Bed Mobility Overal bed mobility: Needs Assistance Bed Mobility: Supine to Sit Rolling: Total assist;+2 for physical assistance;+2 for safety/equipment         General bed mobility comments: total A +2 to transition to EOB using bed pad    Transfers                 General transfer comment: bed placed in chair position,  Ambulation/Gait                 Stairs             Wheelchair Mobility    Modified Rankin (Stroke Patients Only)       Balance Overall balance assessment: Needs assistance Sitting-balance support: Feet supported;No upper extremity supported Sitting balance-Leahy Scale: Zero                                       Cognition Arousal/Alertness: Lethargic Behavior During Therapy: Flat affect Overall Cognitive Status: Difficult to assess                                 General Comments: pt keeps eyes closed throughout but responds to stimulation and nods head yes and no appropriately to simple questions      Exercises Other Exercises Other Exercises: gentle PROM bilateral LE    General Comments        Pertinent Vitals/Pain Pain Assessment: Faces Faces Pain Scale: No hurt Pain Intervention(s): Monitored during session    Home Living                      Prior Function            PT Goals (current goals can now be found in the care plan section) Acute Rehab PT Goals Patient Stated Goal: pt unable to state PT Goal Formulation: Patient unable to participate in goal setting Time For Goal Achievement: 06/18/21 Potential to Achieve Goals: Fair Progress towards PT goals: Not progressing toward goals - comment    Frequency    Min 3X/week      PT Plan Current plan remains appropriate  Co-evaluation              AM-PAC PT "6 Clicks" Mobility   Outcome Measure  Help needed turning from your back to your side while in a flat bed without using bedrails?: Total Help needed moving from lying on your back to sitting on the side of a flat bed without using bedrails?: Total Help needed moving to and from a bed to a chair (including a wheelchair)?: Total Help needed standing up from a chair using your arms (e.g., wheelchair or bedside chair)?: Total Help needed to walk in hospital room?: Total Help needed climbing 3-5 steps with a railing? : Total 6 Click Score: 6    End of Session   Activity Tolerance: Patient limited by lethargy Patient left: in bed;with call bell/phone within reach;with bed alarm set Nurse Communication: Mobility status PT Visit Diagnosis: Muscle weakness (generalized) (M62.81);Hemiplegia and  hemiparesis;Pain;Difficulty in walking, not elsewhere classified (R26.2) Hemiplegia - Right/Left: Left Hemiplegia - dominant/non-dominant: Non-dominant Hemiplegia - caused by: Other Nontraumatic intracranial hemorrhage     Time: [id number] PT Time Calculation (min) (ACUTE ONLY): 24 min  Charges:  $Therapeutic Activity: 23-37 mins                     Christina Nunez PT, DPT Acute Rehabilitation Services Pager [phone] Office [phone]    Linna Hoff 06/07/2021, 5:19 PM

## 2021-06-07 NOTE — Progress Notes (Signed)
SLP Cancellation Note  Patient Details Name: Christina Nunez MRN: [id number] DOB: [birth year]   Cancelled treatment:        Reviewed chart, spoke with RN and briefly observed pt. Increased work of breathing. Not alert for po's. Palliative care meeting planned. Will plan on checking chart/pt Monday 10/24. Please notify ST if needed before then at weekend pager [phone]   Houston Siren 06/07/2021, 5:18 PM

## 2021-06-07 NOTE — Progress Notes (Signed)
Q2 mNIHSS per Dr.Xu

## 2021-06-07 NOTE — Progress Notes (Signed)
Daily Progress Note   Patient Name: Christina Nunez       Date: 06/07/2021 DOB: [birth year]  Age: 65 y.o. MRN#: [id number] Attending Physician: Stroke, Md, MD Primary Care Physician: Medicine, [place] Family Admit Date: 05/31/2021  Reason for Consultation/Follow-up: Establishing goals of care  Subjective: Medical records reviewed. Patient has increased respiratory distress today.   10am: Called patient's sister Christina Nunez to continue ongoing goals of care conversation. Christina Nunez shares her concern that patient is a DNR and has questions about what this entails. Revisited her discussion with PCCM surrounding transition to DNR at time of patient's one-way extubation and that reintubation is not what patient would desire for herself. Discussed recommendations against CPR given the high risk for causing more harm and suffering than the potential for benefit. Counseled and educated thoroughly to sister's satisfaction. Reassured that comfort medications or interventions such as oxygen/BiPAP/CPAP could still be used to address patient's difficulty breathing, depending on goals of care. Christina Nunez continues to feel hopeful for patient's recovery and would desire limited medical interventions for dyspnea, including BiPAP/CPAP; she agrees to leave DNR in place.   1pm: Received update from RN that patient's son Christina Nunez has been contacted and wishes for himself and patient's other son to be involved as her primary decision-makers. Called patient's son Christina Nunez and left a voicemail with PMT contact information. Called patient's son Christina Nunez to provide updates and support, continue goals of care conversation. Reviewed patient's severe ICH due to hypertensive emergency, dysphagia, respiratory failure, and acutely  worsening kidney function. Discussed her ongoing dyspnea and pre-existing CKD. Provided update on patient's outpatient discussions with nephrology as well as recent input during hospitalization - she is not a candidate for dialysis. Informed Christina Nunez that discussions have been had with patient's sister regarding high risk for decline and the recommendation from multiple specialists would be for comfort-focused care if she did not improve or if kidney function worsened further. Christina Nunez verbalizes his understanding and wishes to discuss further with his brother then call PMT again.  4pm: Received voicemail from patient's son Christina Nunez and returned his call. He shares that he and Christina Nunez have made the decision to keep DNR order in place. They will begin driving to come see the patient and expect to arrive tonight around midnight. Discussed the option to continue current care for the time  being and discussing goals of care in person tomorrow in more detail. Christina Nunez agrees.  Questions and concerns addressed. PMT will continue to support holistically.   Length of Stay: 7  Current Medications: Scheduled Meds:    stroke: mapping our early stages of recovery book   Does not apply Once   amLODipine  10 mg Per Tube Daily   carvedilol  25 mg Per Tube BID WC   chlorhexidine  15 mL Mouth Rinse BID   Chlorhexidine Gluconate Cloth  6 each Topical Daily   feeding supplement (PROSource TF)  45 mL Per Tube Daily   fiber  1 packet Per Tube BID   free water  200 mL Per Tube Q4H   heparin injection (subcutaneous)  5,000 Units Subcutaneous Q8H   insulin aspart  0-6 Units Subcutaneous Q4H   insulin detemir  20 Units Subcutaneous BID   mouth rinse  15 mL Mouth Rinse q12n4p   pantoprazole sodium  40 mg Per Tube Daily   torsemide  40 mg Per Tube Daily    Continuous Infusions:  feeding supplement (JEVITY 1.2 CAL) 1,000 mL (06/07/21 0615)    PRN Meds: acetaminophen **OR** acetaminophen (TYLENOL) oral liquid 160 mg/5 mL **OR**  acetaminophen, hydrALAZINE, labetalol, oxyCODONE  Physical Exam Vitals and nursing note reviewed.  Constitutional:      Appearance: She is ill-appearing.  Cardiovascular:     Rate and Rhythm: Normal rate.  Pulmonary:     Effort: Tachypnea present.  Neurological:     Mental Status: She is unresponsive.            Vital Signs: BP (!) 167/81 (BP Location: Left Arm)   Pulse 83   Temp 99.1 F (37.3 C) (Oral) Comment: Simultaneous filing. User may not have seen previous data.  Resp 17   Ht 5\' 3"  (1.6 m)   Wt 80.1 kg   SpO2 98%   BMI 31.28 kg/m  SpO2: SpO2: 98 % O2 Device: O2 Device: Room Air O2 Flow Rate: O2 Flow Rate (L/min): 0 L/min  Intake/output summary:  Intake/Output Summary (Last 24 hours) at 06/07/2021 1001 Last data filed at 06/07/2021 0600 Gross per 24 hour  Intake 1845 ml  Output 525 ml  Net 1320 ml   LBM: Last BM Date: 06/06/21 Baseline Weight: Weight: 70 kg Most recent weight: Weight: 80.1 kg       Palliative Assessment/Data: 10%      Patient Active Problem List   Diagnosis Date Noted   Intracerebral hemorrhage 06/06/2021   ESRD (end stage renal disease) ([place])    Goals of care, counseling/discussion    Intracranial hemorrhage ([place]) 05/31/2021   Endotracheally intubated    Acute respiratory failure ([place])    Hyperglycemia    Acute ischemic stroke ([place]) 08/16/2020   Memory deficit 08/14/2020   AKI (acute kidney injury) ([place]) 08/14/2020   History of ischemic heart disease 08/14/2020   Depression 08/14/2020   Hypertensive urgency 08/14/2020   Essential hypertension 08/13/2020   Hypertensive emergency 08/13/2020    Palliative Care Assessment & Plan   Patient Profile: 65 y.o. female  with past medical history of mild dementia, CKD IV, pre DM, HLD, ischemic stroke, HTN, and depression, admitted on 05/31/2021 with unresponsiveness and hypertensive emergency.    Patient had seizure activity en route via EMS and was intubated. Imaging has shown ICH  with IVH, SAH, and 8 mm midline shift. Hospital course was further complicated by AKI/CKD Stage IV. Patient was discussing dialysis as an outpatient and  is not considered a candidate. PMT has been consulted to assist with goals of care conversation.  Assessment: Goals of care conversation right BG large ICH with IVH and SAH, likely due to hypertensive emergency AKI on CKD, worsening Respiratory failure  Recommendations/Plan: DNR confirmed Continue current care until patient's sons arrive from out of town and can participate in goals of care discussions My colleague Tacey Ruiz NP will follow up with patient's sons for a family meeting tomorrow   Goals of Care and Additional Recommendations: Limitations on Scope of Treatment: No Tracheostomy  Code Status: DNR   Code Status Orders  (From admission, onward)           Start     Ordered   06/05/21 1319  Do not attempt resuscitation (DNR)  Continuous       Question Answer Comment  In the event of cardiac or respiratory ARREST Do not call a "code blue"   In the event of cardiac or respiratory ARREST Do not perform Intubation, CPR, defibrillation or ACLS   In the event of cardiac or respiratory ARREST Use medication by any route, position, wound care, and other measures to relive pain and suffering. May use oxygen, suction and manual treatment of airway obstruction as needed for comfort.      06/05/21 1318           Code Status History     Date Active Date Inactive Code Status Order ID Comments User Context   05/31/2021 1694 06/05/2021 1318 Full Code [id number]  Gardiner Barefoot, NP ED   08/13/2020 2134 08/26/2020 1632 Full Code [id number]  Cox, Amy Delane Ginger, DO ED      Prognosis:  Guarded, patient's respiratory status is tenuous and kidney function continues to deteriorate   Discharge Planning: To Be Determined  Care plan was discussed with Patient's sister Christina Nunez, patient's son Christina Nunez, Dr. Erlinda Hong, Dr. Ander Slade, RN  Total  time: 70 minutes Prolonged time: Yes Greater than 50% of this time was spent in counseling and coordinating care related to the above assessment and plan.  Dorthy Cooler, PA-C Palliative Medicine Team Team phone # [phone]  Thank you for allowing the Palliative Medicine Team to assist in the care of this patient. Please utilize secure chat with additional questions, if there is no response within 30 minutes please call the above phone number.  Palliative Medicine Team providers are available by phone from 7am to 7pm daily and can be reached through the team cell phone.  Should this patient require assistance outside of these hours, please call the patient's attending physician.
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
## 2021-06-08 DIAGNOSIS — N186 End stage renal disease: Secondary | ICD-10-CM | POA: Diagnosis not present

## 2021-06-08 DIAGNOSIS — Z7189 Other specified counseling: Secondary | ICD-10-CM

## 2021-06-08 DIAGNOSIS — Z515 Encounter for palliative care: Secondary | ICD-10-CM

## 2021-06-08 DIAGNOSIS — I629 Nontraumatic intracranial hemorrhage, unspecified: Secondary | ICD-10-CM | POA: Diagnosis not present

## 2021-06-08 LAB — CBC
HCT: 28.6 % — ABNORMAL LOW (ref 36.0–46.0)
Hemoglobin: 9.1 g/dL — ABNORMAL LOW (ref 12.0–15.0)
MCH: 29 pg (ref 26.0–34.0)
MCHC: 31.8 g/dL (ref 30.0–36.0)
MCV: 91.1 fL (ref 80.0–100.0)
Platelets: 170 10*3/uL (ref 150–400)
RBC: 3.14 MIL/uL — ABNORMAL LOW (ref 3.87–5.11)
RDW: 15.6 % — ABNORMAL HIGH (ref 11.5–15.5)
WBC: 8.1 10*3/uL (ref 4.0–10.5)
nRBC: 0 % (ref 0.0–0.2)

## 2021-06-08 LAB — BASIC METABOLIC PANEL
Anion gap: 11 (ref 5–15)
BUN: 135 mg/dL — ABNORMAL HIGH (ref 8–23)
CO2: 14 mmol/L — ABNORMAL LOW (ref 22–32)
Calcium: 8.2 mg/dL — ABNORMAL LOW (ref 8.9–10.3)
Chloride: 121 mmol/L — ABNORMAL HIGH (ref 98–111)
Creatinine, Ser: 7.51 mg/dL — ABNORMAL HIGH (ref 0.44–1.00)
GFR, Estimated: 6 mL/min — ABNORMAL LOW (ref >60)
Glucose, Bld: 202 mg/dL — ABNORMAL HIGH (ref 70–99)
Potassium: 4.5 mmol/L (ref 3.5–5.1)
Sodium: 146 mmol/L — ABNORMAL HIGH (ref 135–145)

## 2021-06-08 LAB — GLUCOSE, CAPILLARY
Glucose-Capillary: 167 mg/dL — ABNORMAL HIGH (ref 70–99)
Glucose-Capillary: 181 mg/dL — ABNORMAL HIGH (ref 70–99)

## 2021-06-08 MED ORDER — POLYVINYL ALCOHOL 1.4 % OP SOLN
1.0000 [drp] | Freq: Four times a day (QID) | OPHTHALMIC | Status: DC | PRN
  Filled 2021-06-08: qty 15

## 2021-06-08 MED ORDER — ONDANSETRON HCL 4 MG/2ML IJ SOLN: 4.0000 mg | Freq: Four times a day (QID) | INTRAMUSCULAR | Status: DC | PRN

## 2021-06-08 MED ORDER — SODIUM CHLORIDE 0.9 % IV SOLN: INTRAVENOUS | Status: DC | PRN

## 2021-06-08 MED ORDER — ACETAMINOPHEN 650 MG RE SUPP
650.0000 mg | Freq: Four times a day (QID) | RECTAL | Status: DC | PRN
  Administered 2021-06-10: 650 mg via RECTAL
  Filled 2021-06-08 (×2): qty 1

## 2021-06-08 MED ORDER — GLYCOPYRROLATE 0.2 MG/ML IJ SOLN
0.4000 mg | INTRAMUSCULAR | Status: DC
  Administered 2021-06-08 – 2021-06-10 (×13): 0.4 mg via INTRAVENOUS
  Filled 2021-06-08 (×13): qty 2

## 2021-06-08 MED ORDER — ACETAMINOPHEN 325 MG PO TABS: 650.0000 mg | ORAL_TABLET | Freq: Four times a day (QID) | ORAL | Status: DC | PRN

## 2021-06-08 MED ORDER — HALOPERIDOL LACTATE 2 MG/ML PO CONC
0.5000 mg | ORAL | Status: DC | PRN
  Filled 2021-06-08: qty 0.3

## 2021-06-08 MED ORDER — HYDROMORPHONE HCL 1 MG/ML IJ SOLN
1.0000 mg | Freq: Three times a day (TID) | INTRAMUSCULAR | Status: DC
Start: 2021-06-08 — End: 2021-06-11
  Administered 2021-06-08 – 2021-06-10 (×8): 1 mg via INTRAVENOUS
  Filled 2021-06-08 (×8): qty 1

## 2021-06-08 MED ORDER — BIOTENE DRY MOUTH MT LIQD: 15.0000 mL | OROMUCOSAL | Status: DC | PRN

## 2021-06-08 MED ORDER — HALOPERIDOL 0.5 MG PO TABS
0.5000 mg | ORAL_TABLET | ORAL | Status: DC | PRN
  Filled 2021-06-08: qty 1

## 2021-06-08 MED ORDER — ONDANSETRON 4 MG PO TBDP: 4.0000 mg | ORAL_TABLET | Freq: Four times a day (QID) | ORAL | Status: DC | PRN

## 2021-06-08 MED ORDER — LORAZEPAM 2 MG/ML IJ SOLN: 0.5000 mg | INTRAMUSCULAR | Status: DC | PRN

## 2021-06-08 MED ORDER — HALOPERIDOL LACTATE 5 MG/ML IJ SOLN: 0.5000 mg | INTRAMUSCULAR | Status: DC | PRN

## 2021-06-08 NOTE — Progress Notes (Signed)
Manufacturing engineer [facility]) [facility] Liaison note  Received request from [facility] for family interest in [facility]. Chart reviewed and [facility] eligibility is pending at this time. Have spoken to patient's son Darnelle Maffucci to acknowledge referral and explain services.  Unfortunately [facility] does not have a bed to offer today. TOC is aware hospital liaison will follow up tomorrow or sooner if room becomes available.   Please do not hesitate to call with any hospice related questions or concerns.   Thank you for the opportunity to participate in this patient's care.  Jhonnie Garner, Therapist, sports, [facility] Liaison  [phone]

## 2021-06-08 NOTE — Progress Notes (Signed)
   06/08/21 1215  Clinical Encounter Type  Visited With Patient and family together;Health care provider  Visit Type Initial;Patient actively dying;Spiritual support;Critical Care  Referral From Palliative care team  Spiritual Encounters  Spiritual Needs Prayer;Emotional;Grief support   Chaplain responded to a request from palliative care to meet with the patient's sons and offer prayer and support. Chaplain offered prayer and support.Chaplain introduced spiritual care services. Spiritual care services available as needed.   Jeri Lager, Chaplain

## 2021-06-08 NOTE — Progress Notes (Signed)
NAME:  Christina Nunez, MRN:  [id number], DOB:  11/21/1955, LOS: 8 ADMISSION DATE:  05/31/2021, CONSULTATION DATE: 10/14 REFERRING MD: Dr. Theda Sers, CHIEF COMPLAINT: Stroke  History of Present Illness:  Patient is a 65 year old female with PMH of ischemic stroke, HTN, HLD, CKD IV, mild dementia, pre-DM presents to [facility] ED on 10/14 for unresponsiveness.  Patient's last known normal at 7:50 AM on 10/14.  [place] RN was unable to get into house today, family called and patient was found unresponsive next to bed.  EMS arrived and patient had seizure like activity in route to [facility] ED.  2.5 mg of Versed given.  Upon arrival to [facility] ED 10/14, patient was intubated for airway protection.  BP was high.  CT head: Large parenchymal hemorrhage within right basal ganglia region and surrounding white matter; mass-effect with 8 mm leftward midline shift without ventricle entrapment. Neuro consulted.  Hypertonic saline started. Started on Cleviprex. Patient admitted to ICU.  PCCM consulted for management of vent while intubated and medical management.  Pertinent  Medical History   Past Medical History:  Diagnosis Date   Hypertension    Stroke ([place])    Significant Hospital Events: Including procedures, antibiotic start and stop dates in addition to other pertinent events   10/14: adm with RT ICH with IVH & SAH seizure per EMS ,on cleviprex gtt 10/14 cardiology consulted for high troponin 18 K 10/15 MRI stable hematoma, SAH, IVH with midline shift 8 mm 10/16 EEG shows no evidence of seizure activity. 10/19 Extubated  Interim History / Subjective:   No overnight events  Objective   Blood pressure 133/75, pulse 78, temperature 99.2 F (37.3 C), temperature source Oral, resp. rate 16, height 5\' 3"  (1.6 m), weight 80.1 kg, SpO2 97 %.        Intake/Output Summary (Last 24 hours) at 06/08/2021 4132 Last data filed at 06/08/2021 0800 Gross per 24 hour  Intake 2339.41 ml  Output 1050 ml  Net 1289.41 ml    Filed Weights   06/02/21 0500 06/04/21 0500 06/07/21 0500  Weight: 75.3 kg 78 kg 80.1 kg   Examination: General: Unresponsive, HEENT: moist oral mucosa Neuro: Not opening eyes, moving right side CV: S1-S2 appreciated PULM: Clear breath sounds skin is warm and dry GI: soft, bsx4 active  Extremities: warm/dry, no edema  Skin: no rashes or lesions   Labs: Creatinine remains elevated BUN elevated Elevated sodium  Resolved Hospital Problem list     Assessment & Plan:   Large right intracerebral hemorrhage in the right basal ganglia with 8 mm leftward shift from cerebral edema Hypertensive urgency Hypernatremia -Improving  Acute kidney injury on chronic kidney disease -Not a dialysis candidate -This has been discussed extensively as outpatient  Aspiration pneumonia -Day 7 of Augmentin -Stop Augmentin after today's dose  Type 2 diabetes with hyperglycemia -On SSI  History of depression  Protecting airway  Appreciate palliative care involvement -Patient remains DNR  Patients sons coming in today for further goals of care discussions  Plan:  .  Continue free water for hypernatremia .  Appreciate palliative care involvement .  Hemodialysis is not an option of treatment, clarified with nephrology .  Respiratory status stable overnight.  Patient may be transferred to hospitalist service  Best Practice (right click and "Reselect all SmartList Selections" daily)   Diet/type: tubefeeds and NPO w/ meds via tube DVT prophylaxis: SCD GI prophylaxis: PPI Lines: N/A Foley:  Yes, and it is still needed Code Status:  full code Last date  of multidisciplinary goals of care discussion [per primary]  Labs   CBC: Recent Labs  Lab 06/03/21 0652 06/04/21 0345 06/05/21 0452 06/06/21 0341 06/08/21 0348  WBC 13.5* 11.6* 12.6* 12.8* 8.1  HGB 8.6* 9.1* 9.6* 10.3* 9.1*  HCT 27.3* 28.4* 30.3* 33.0* 28.6*  MCV 91.9 91.9 91.8 93.2 91.1  PLT 93* 97* 94* 123* 170     Basic Metabolic Panel: Recent Labs  Lab 06/02/21 0052 06/02/21 0956 06/03/21 0652 06/04/21 0345 06/05/21 0452 06/06/21 0341 06/08/21 0348  NA 162*   < > 152* 155* 150* 151* 146*  K 3.9  --  3.5 3.7 4.2 4.7 4.5  CL >130*  --  129* 130* 127* 126* 121*  CO2 16*  --  17* 18* 14* 13* 14*  GLUCOSE 222*  --  229* 256* 209* 131* 202*  BUN 74*  --  82* 89* 96* 107* 135*  CREATININE 7.17*  --  7.38* 7.69* 7.56* 7.62* 7.51*  CALCIUM 8.1*  --  8.2* 8.1* 7.8* 8.5* 8.2*  MG 2.0  --   --   --   --   --   --   PHOS 4.8*  --   --   --   --   --   --    < > = values in this interval not displayed.   GFR: Estimated Creatinine Clearance: 7.6 mL/min (A) (by C-G formula based on SCr of 7.51 mg/dL (H)). Recent Labs  Lab 06/04/21 0345 06/05/21 0452 06/06/21 0341 06/08/21 0348  WBC 11.6* 12.6* 12.8* 8.1    Liver Function Tests: No results for input(s): AST, ALT, ALKPHOS, BILITOT, PROT, ALBUMIN in the last 168 hours.  No results for input(s): LIPASE, AMYLASE in the last 168 hours. No results for input(s): AMMONIA in the last 168 hours.  ABG    Component Value Date/Time   PHART 7.321 (L) 05/31/2021 1819   PCO2ART 30.5 (L) 05/31/2021 1819   PO2ART 95 05/31/2021 1819   HCO3 15.7 (L) 05/31/2021 1819   TCO2 17 (L) 05/31/2021 1819   ACIDBASEDEF 9.0 (H) 05/31/2021 1819   O2SAT 97.0 05/31/2021 1819    Coagulation Profile: No results for input(s): INR, PROTIME in the last 168 hours.   Cardiac Enzymes: No results for input(s): CKTOTAL, CKMB, CKMBINDEX, TROPONINI in the last 168 hours.  HbA1C: Hgb A1c MFr Bld  Date/Time Value Ref Range Status  05/31/2021 07:02 PM 6.0 (H) 4.8 - 5.6 % Final    Comment:    (NOTE) Pre diabetes:          5.7%-6.4%  Diabetes:              >6.4%  Glycemic control for   <7.0% adults with diabetes   08/13/2020 04:18 PM 6.3 (H) 4.8 - 5.6 % Final    Comment:    (NOTE) Pre diabetes:          5.7%-6.4%  Diabetes:              >6.4%  Glycemic control  for   <7.0% adults with diabetes     CBG: Recent Labs  Lab 06/07/21 1541 06/07/21 1950 06/07/21 2338 06/08/21 0341 06/08/21 0823  GLUCAP 203* 193* 213* 167* 181*   Sherrilyn Rist, MD [place] PCCM Pager: See Shea Evans

## 2021-06-08 NOTE — TOC Initial Note (Signed)
Transition of Care [facility]) - Initial/Assessment Note    Patient Details  Name: Christina Nunez MRN: [id number] Date of Birth: 07-25-1956  Transition of Care [facility]) CM/SW Contact:    Alfredia Ferguson, LCSW Phone Number: 06/08/2021, 12:39 PM  Clinical Narrative:                 CSW contacted by palliative provider as family has agreed on residential hospice and patient meets criteria. CSW met with patient's sons at bedside and introduced self and role. CSW discussed residential hospice options. CSW noted patient's adult sons called other family and the family agreed on [facility] in [place]. CSW reached out to [facility] and initiated referral. CSW notes no current beds available today.   Expected Discharge Plan: [place] Barriers to Discharge: Hospice Bed not available   Patient Goals and CMS Choice     Choice offered to / list presented to : Adult Children  Expected Discharge Plan and Services Expected Discharge Plan: [place] Acute Care Choice: Hospice                                        Prior Living Arrangements/Services   Lives with:: Self Patient language and need for interpreter reviewed:: No        Need for Family Participation in Patient Care: Yes (Comment) Care giver support system in place?: Yes (comment)   Criminal Activity/Legal Involvement Pertinent to Current Situation/Hospitalization: No - Comment as needed  Activities of Daily Living Home Assistive Devices/Equipment: None ADL Screening (condition at time of admission) Patient's cognitive ability adequate to safely complete daily activities?: No Is the patient deaf or have difficulty hearing?: No Does the patient have difficulty concentrating, remembering, or making decisions?: No Patient able to express need for assistance with ADLs?: Yes Does the patient have difficulty dressing or bathing?: Yes Independently performs ADLs?:  No Does the patient have difficulty walking or climbing stairs?: Yes Weakness of Legs: None Weakness of Arms/Hands: None  Permission Sought/Granted                  Emotional Assessment         Alcohol / Substance Use: Not Applicable    Admission diagnosis:  ICH (intracerebral hemorrhage) ([place]) [I61.9] Patient Active Problem List   Diagnosis Date Noted   Intracerebral hemorrhage 06/06/2021   ESRD (end stage renal disease) ([place])    Goals of care, counseling/discussion    Intracranial hemorrhage ([place]) 05/31/2021   Endotracheally intubated    Acute respiratory failure ([place])    Hyperglycemia    Acute ischemic stroke ([place]) 08/16/2020   Memory deficit 08/14/2020   AKI (acute kidney injury) ([place]) 08/14/2020   History of ischemic heart disease 08/14/2020   Depression 08/14/2020   Hypertensive urgency 08/14/2020   Essential hypertension 08/13/2020   Hypertensive emergency 08/13/2020   PCP:  Med[facility]:   [place], [place] [address] [place] [address] Phone: [phone] Fax: [place], [address] AT [facility] OF [address] [place] [address] [place] [address] Phone: [phone] Fax: [phone]     Social Determinants of Health (SDOH) Interventions    Readmission Risk  Interventions No flowsheet data found.

## 2021-06-08 NOTE — Progress Notes (Addendum)
Palliative Medicine Inpatient Follow Up Note  HPI: 65 y.o. female  with past medical history of mild dementia, CKD IV, pre DM, HLD, ischemic stroke, HTN, and depression, admitted on 05/31/2021 with unresponsiveness and hypertensive emergency.    Patient had seizure activity en route via EMS and was intubated. Imaging has shown ICH with IVH, SAH, and 8 mm midline shift. Hospital course was further complicated by AKI/CKD Stage IV. Patient was discussing dialysis as an outpatient and is not considered a candidate. PMT has been consulted to assist with goals of care conversation.  Today's Discussion (06/08/2021):  *Please note that this is a verbal dictation therefore any spelling or grammatical errors are due to the "[employer] One" system interpretation.  Chart reviewed.  I met with patients son(s) Darnelle Maffucci and Shanon Brow at bedside. We discussed that Britny is incredibly sick in the setting of  a large ICH with IVH and SAH due to very poor blood pressure management. We discussed that her overall trajectory is poor in the setting of her AKI on CKD and I suspect that even in the best of circumstances she will require tremendous care moving forward.  Darnelle Maffucci and Shanon Brow share that they have spoke to their family and they all agree that if Imonie could speak for herself she would not want to live like this. They state that they do not want her to struggle or suffer anymore than she already has.  We reviewed what comfort care would look like in the hospital  inclusive of medications to control pain, dyspnea, agitation, nausea, itching, and hiccups.  We discussed stopping all uneccessary measures such as NGT feedings, blood draws, needle sticks, and frequent vital signs. Patients sons very much believe that this is what Yanna wants. She was able to squeeze her son, Darnelle Maffucci hand in agreement with the change to comfort oriented care.  I called patients sister, [name] and shared with her the above update.  She expresses that she agrees with a comfort emphasis of care. She asked me if I could speak to [place] and very her understanding through squeezing of my hand. She was especially concerned about this as it related to stopping NGT feedings. I was able to share that I would go to bedside with patients RN, Anderson Malta to verify as best we could understanding.   At bedside Laryah was able to squeeze her son's hand when asked if she would be alright with [place] stopping tube feeding and removing her NGT. She also squeeze his hand again to when we discussed comfort measures.  I was able to secure chat patients primary medical team afterwards to update them of this change.   Questions and concerns addressed   Objective Assessment: Vital Signs Vitals:   06/08/21 1100 06/08/21 1200  BP: (!) 159/96 (!) 158/89  Pulse:  74  Resp: 17 14  Temp:    SpO2: 98% 97%    Intake/Output Summary (Last 24 hours) at 06/08/2021 1253 Last data filed at 06/08/2021 1200 Gross per 24 hour  Intake 2739.4 ml  Output 1050 ml  Net 1689.4 ml   Last Weight  Most recent update: 06/07/2021  6:20 AM    Weight  80.1 kg (176 lb 9.4 oz)            Gen:  Older AA F in mild respiratory distress HEENT: Dry mucous membranes, coretrack in place CV: Regular rate and rhythm  PULM:Tachypneic ABD: soft/nontender EXT: No edema  Neuro: Can squeeze hand   SUMMARY OF  RECOMMENDATIONS   DNAR/DNI Met with patients sons, family would like for patient to be comfortable Comfort measures initiated NGT DC'd Comfort medications per MAR - low dose dilaudid ATC for dyspnea Chaplain support  TOC - Referral to [facility] hospice needs for symptoms burden, should meet criteria in the setting of renal dz Unrestricted visitation My colleague Dorthy Cooler will return tomorrow for additional support  Time Spent: 80 Greater than 50% of the time was spent in counseling and coordination of  care ______________________________________________________________________________________ [place] Team Team Cell Phone: [phone] Please utilize secure chat with additional questions, if there is no response within 30 minutes please call the above phone number  Palliative Medicine Team providers are available by phone from 7am to 7pm daily and can be reached through the team cell phone.  Should this patient require assistance outside of these hours, please call the patient's attending physician.

## 2021-06-08 NOTE — Final Progress Note (Addendum)
STROKE TEAM PROGRESS NOTE   INTERVAL HISTORY She is lying in bed, drowsy awakens easily to stimuli, follows commands. Right side with purposeful movement. Palliative meeting today for goals of care. No new neurological events overnight. No family at bedside  Will transfer to floor and hospitalist to assume care tomorrow Vital signs stable.  Neurological exam is unchanged Vitals:   06/08/21 0900 06/08/21 1000 06/08/21 1100 06/08/21 1200  BP: (!) 155/82 (!) 159/88 (!) 159/96 (!) 158/89  Pulse: 78 81  74  Resp: 14 (!) 28 17 14   Temp:      TempSrc:      SpO2: 98% 97% 98% 97%  Weight:      Height:       CBC:  Recent Labs  Lab 06/06/21 0341 06/08/21 0348  WBC 12.8* 8.1  HGB 10.3* 9.1*  HCT 33.0* 28.6*  MCV 93.2 91.1  PLT 123* 185   Basic Metabolic Panel:  Recent Labs  Lab 06/02/21 0052 06/02/21 0956 06/06/21 0341 06/08/21 0348  NA 162*   < > 151* 146*  K 3.9   < > 4.7 4.5  CL >130*   < > 126* 121*  CO2 16*   < > 13* 14*  GLUCOSE 222*   < > 131* 202*  BUN 74*   < > 107* 135*  CREATININE 7.17*   < > 7.62* 7.51*  CALCIUM 8.1*   < > 8.5* 8.2*  MG 2.0  --   --   --   PHOS 4.8*  --   --   --    < > = values in this interval not displayed.    IMAGING past 24 hours No results found.  PHYSICAL EXAM  Temp:  [97.8 F (36.6 C)-99.2 F (37.3 C)] 97.8 F (36.6 C) (10/22 0800) Pulse Rate:  [58-81] 74 (10/22 1200) Resp:  [14-33] 14 (10/22 1200) BP: (133-174)/(75-96) 158/89 (10/22 1200) SpO2:  [96 %-100 %] 97 % (10/22 1200)  General - Well nourished, well developed middle-aged African-American lady in no apparent distress.  Ophthalmologic - fundi not visualized due to noncooperation.  Cardiovascular - Regular rhythm and rate.  Mental Status -  Eyes are closed. She is drowsy, awakens easily to stimuli. Has gaze preference, moves right side purposeful  Cranial Nerves II - XII - II - no blink to threat bilaterally  III, IV, VI - Extraocular movements not intact. V -  unable to assess  VII - left droop VIII - Hearing & vestibular intact bilaterally. X - unable to assess  XI - unable to assess XII - unable to assess   Motor Strength -left side flaccid. Right side is purposeful antigravity movements. Motor Tone - Muscle tone was assessed at the neck and appendages and was normal.  Sensory - Light touch, temperature/pinprick were assessed and were symmetrical.    Coordination - unable to assess  Gait and Station - deferred.   ASSESSMENT/PLAN [name] is a 65 yo female with a PMHx of mild dementia, CKD IV, pre DM, HLD, ischemic stroke, HTN, and depression admitted for unresponsiveness. Patient had seizure activity en route and was given Versed 2.5mg  per EMS.    ICH - right BG large ICH with IVH and SAH, likely due to hypertensive emergency [place] - large area of parenchymal hemorrhage centered within the right basal ganglia region. Subarachnoid extention also present with intraventricular extension (ICH 4). 57mm leftward midline shift.  CTA head & neck No AVM or aneurysm CT repeat stable hematoma,  midline shift, no hydrocephalus MRI brain stable hematoma, SAH, IVH and midline shift 8 mm CT repeat 10/20 unchanged ICH and IVH as well as midline shift 2D Echo EF 60 to 65% EEG no seizure but encephalopathy LDL 128 HgbA1c 6.3 VTE prophylaxis - SCDs aspirin 81 mg daily and clopidogrel 75 mg daily prior to admission, now on No antithrombotic given ICH Therapy recommendations: Pending Disposition: Pending, palliative care on board, ongoing [place] discussion with sister   Cerebral edema CT and MRI showed stable midline shift 8 mm On 3% saline @ 75 -> FW 200 Q4h ->NS @ 50->off Na 142-149-153-163-163-160->157->152->155->150->151->146 Allow Na trending down gradually CT repeat unchanged ICH and IVH as well as midline shift   Respiratory failure Intubated on ventilation Stable mental status CCM on board Extubated on 10/19 -> moderate to severe  respiratory distress -> sister decided no reintubation -> code status DNR Palliative care on board  AKI on CKD  Cre 6.65->6.90->6.76->7.17->7.38->7.69->7.56->7.62->7.51 Baseline around 4 in 08/2020 Per sister, patient deemed not a candidate for dialysis by her nephrologist D/c IVF, on TF Continue to be oligouria  CCM on board Nephrology consulted and agree with not candidate for dialysis   Seizure Witnessed seizure with EMS, stat post Versed 2.5 mg Currently no clinical seizure EEG no seizure but encephalopathy Keppra 250 twice daily->d/c due to potential sedative effect and no more clinical seizure seen   History of stroke 07/2020 admitted for right CR/SO lacunar infarct on MRI.  CT no acute abnormality.  MRI also showed old left cerebellum, left BG and thalamic, bilateral SO and CR infarcts.  MRI head and neck bilateral siphon moderate atherosclerosis, left more than right.  DVT negative.  EF 50 to 55%.  LDL 128, A1c 6.3.  Patient discharged with DAPT and Lipitor 40.   Hypertensive emergency Home meds: Lasix, Coreg 6.25 Stable, off Cleviprex Labetalol IV PRN On amlodipine 5->10 and Coreg 12.5->25 Long-term BP goal normotensive   Hyperlipidemia Home meds: Lipitor 40,  LDL 128, goal < 70 May consider statin at discharge   Dysphagia On tube feeding @ 60   Other Stroke Risk Factors CAD status post CABG   Other Active Problems Leukocytosis resolved WBC 8.1 Anemia of chronic disease, hemoglobin 11.1->8.8->8.6->9.1->9.6->10.3->9.1    Hospital day # 8  Beulah Gandy, NP  I have personally obtained history,examined this patient, reviewed notes, independently viewed imaging studies, participated in medical decision making and plan of care.ROS completed by me personally and pertinent positives fully documented  I have made any additions or clarifications directly to the above note. Agree with note above.  Patient remains critically ill due to large intracerebral hemorrhage and due  to medical comorbidities and renal failure she is unlikely to survive and make meaningful recovery and will likely need prolonged nursing care and feeding tube.  Family has already made a DNR and is meeting with palliative care later today to discuss goals of care and probably make her comfort care.  Transfer to neurological floor today and medical hospitalist team.  No family available at the bedside.This patient is critically ill and at significant risk of neurological worsening, death and care requires constant monitoring of vital signs, hemodynamics,respiratory and cardiac monitoring, extensive review of multiple databases, frequent neurological assessment, discussion with family, other specialists and medical decision making of high complexity.I have made any additions or clarifications directly to the above note.This critical care time does not reflect procedure time, or teaching time or supervisory time of PA/NP/Med Resident etc but could involve  care discussion time.  I spent 30 minutes of neurocritical care time  in the care of  this patient.      Antony Contras, MD Medical Director [place] Pager: [phone] 06/08/2021 3:19 PM  To contact Stroke Continuity provider, please refer to [email or web address] After hours, contact General Neurology

## 2021-06-09 DIAGNOSIS — I619 Nontraumatic intracerebral hemorrhage, unspecified: Principal | ICD-10-CM

## 2021-06-09 DIAGNOSIS — I629 Nontraumatic intracranial hemorrhage, unspecified: Secondary | ICD-10-CM | POA: Diagnosis not present

## 2021-06-09 DIAGNOSIS — Z7189 Other specified counseling: Secondary | ICD-10-CM | POA: Diagnosis not present

## 2021-06-09 DIAGNOSIS — N186 End stage renal disease: Secondary | ICD-10-CM | POA: Diagnosis not present

## 2021-06-09 NOTE — TOC Progression Note (Signed)
Transition of Care [facility]) - Progression Note    Patient Details  Name: Christina Nunez MRN: [id number] Date of Birth: 10-18-1955  Transition of Care [facility]) CM/SW [place], [place] Phone Number: 06/09/2021, 11:05 AM  Clinical Narrative:     CSW LVM with [facility] to check on status of patients referral to [facility]. CSW awaiting callback. CSW will continue to follow and assist with patients dc planning needs.  Expected Discharge Plan: [place] Barriers to Discharge: Hospice Bed not available  Expected Discharge Plan and Services Expected Discharge Plan: [place]     Post Acute Care Choice: Hospice                                         Social Determinants of Health (SDOH) Interventions    Readmission Risk Interventions No flowsheet data found.

## 2021-06-09 NOTE — Progress Notes (Addendum)
STROKE TEAM PROGRESS NOTE   INTERVAL HISTORY She is lying in bed, NAD appears very comfortable. She is drowsy, does not open eyes, awakens easily to stimuli, follows commands. Right side with purposeful movement. Patient able to nod head Yes when asked if she was comfortable. Son is at bedside. Son updated on status of patient and that we are awaiting a bed at [facility] to be available.   Vitals:   06/08/21 1100 06/08/21 1200 06/08/21 1643 06/08/21 1949  BP: (!) 159/96 (!) 158/89 (!) 152/94 (!) 185/90  Pulse:  74 83 83  Resp: 17 14 20 18   Temp:   99.3 F (37.4 C) 98 F (36.7 C)  TempSrc:   Oral Oral  SpO2: 98% 97% 99% 100%  Weight:      Height:       CBC:  Recent Labs  Lab 06/06/21 0341 06/08/21 0348  WBC 12.8* 8.1  HGB 10.3* 9.1*  HCT 33.0* 28.6*  MCV 93.2 91.1  PLT 123* 034    Basic Metabolic Panel:  Recent Labs  Lab 06/06/21 0341 06/08/21 0348  NA 151* 146*  K 4.7 4.5  CL 126* 121*  CO2 13* 14*  GLUCOSE 131* 202*  BUN 107* 135*  CREATININE 7.62* 7.51*  CALCIUM 8.5* 8.2*     IMAGING past 24 hours No results found.  PHYSICAL EXAM  Temp:  [98 F (36.7 C)-99.3 F (37.4 C)] 98 F (36.7 C) (10/22 1949) Pulse Rate:  [83] 83 (10/22 1949) Resp:  [18-20] 18 (10/22 1949) BP: (152-185)/(90-94) 185/90 (10/22 1949) SpO2:  [99 %-100 %] 100 % (10/22 1949)  General - Well nourished, well developed middle-aged African-American lady in no apparent distress.  Ophthalmologic - fundi not visualized due to noncooperation.  Cardiovascular - Regular rhythm and rate.  Mental Status -  Eyes are closed. She is drowsy, does not open eyes, awakens easily to stimuli. Has gaze preference, moves right side purposeful  Cranial Nerves II - XII - II - no blink to threat bilaterally  III, IV, VI - Extraocular movements not intact. V - unable to assess  VII - left droop VIII - Hearing & vestibular intact bilaterally. X - unable to assess  XI - unable to assess XII -  unable to assess   Motor Strength -left side flaccid. Right side is purposeful antigravity movements. Motor Tone - Muscle tone was assessed at the neck and appendages and was normal.  Sensory - Light touch, temperature/pinprick were assessed and were symmetrical.    Coordination - unable to assess  Gait and Station - deferred.   ASSESSMENT/PLAN Christina Nunez is a 65 yo female with a PMHx of mild dementia, CKD IV, pre DM, HLD, ischemic stroke, HTN, and depression admitted for unresponsiveness. Patient had seizure activity en route and was given Versed 2.5mg  per EMS.    ICH - right BG large ICH with IVH and SAH, likely due to hypertensive emergency [place] - large area of parenchymal hemorrhage centered within the right basal ganglia region. Subarachnoid extention also present with intraventricular extension (ICH 4). 84mm leftward midline shift.  CTA head & neck No AVM or aneurysm CT repeat stable hematoma, midline shift, no hydrocephalus MRI brain stable hematoma, SAH, IVH and midline shift 8 mm CT repeat 10/20 unchanged ICH and IVH as well as midline shift 2D Echo EF 60 to 65% EEG no seizure but encephalopathy LDL 128 HgbA1c 6.3 VTE prophylaxis - SCDs aspirin 81 mg daily and clopidogrel 75 mg daily prior  to admission, now on No antithrombotic given ICH Disposition: [place] when bed available    Cerebral edema CT and MRI showed stable midline shift 8 mm On 3% saline @ 75 -> FW 200 Q4h ->NS @ 50->off Na 142-149-153-163-163-160->157->152->155->150->151->146 Allow Na trending down gradually CT repeat unchanged ICH and IVH as well as midline shift   Respiratory failure Intubated on ventilation Stable mental status CCM on board Extubated on 10/19 -> moderate to severe respiratory distress -> sister decided no reintubation -> code status DNR Palliative care on board  AKI on CKD  Cre 6.65->6.90->6.76->7.17->7.38->7.69->7.56->7.62->7.51 Baseline around 4 in 08/2020 Per sister,  patient deemed not a candidate for dialysis by her nephrologist D/c IVF, on TF Continue to be oligouria  CCM on board Nephrology consulted and agree with not candidate for dialysis   Seizure Witnessed seizure with EMS, stat post Versed 2.5 mg Currently no clinical seizure EEG no seizure but encephalopathy Keppra 250 twice daily->d/c due to potential sedative effect and no more clinical seizure seen   History of stroke 07/2020 admitted for right CR/SO lacunar infarct on MRI.  CT no acute abnormality.  MRI also showed old left cerebellum, left BG and thalamic, bilateral SO and CR infarcts.  MRI head and neck bilateral siphon moderate atherosclerosis, left more than right.  DVT negative.  EF 50 to 55%.  LDL 128, A1c 6.3.  Patient discharged with DAPT and Lipitor 40.   Hypertensive emergency Home meds: Lasix, Coreg 6.25 Stable, off Cleviprex Labetalol IV PRN On amlodipine 5->10 and Coreg 12.5->25 Long-term BP goal normotensive   Hyperlipidemia Home meds: Lipitor 40,  LDL 128, goal < 70 May consider statin at discharge   Dysphagia NPO   Other Stroke Risk Factors CAD status post CABG   Other Active Problems Leukocytosis resolved WBC 8.1 Anemia of chronic disease, hemoglobin 11.1->8.8->8.6->9.1->9.6->10.3->9.1    Hospital day # 9  Christina Gandy, NP  I have personally obtained history,examined this patient, reviewed notes, independently viewed imaging studies, participated in medical decision making and plan of care.ROS completed by me personally and pertinent positives fully documented  I have made any additions or clarifications directly to the above note. Agree with note above.  I had a long discussion with the son at the bedside regarding comfort care and hospice and answered questions.  Greater than 50% time during this 25-minute visit was spent in counseling and coordination of care and discussion with patient's son answering questions.  Social worker to try to transfer to  [place] home in the next few days if bed available  Antony Contras, [place] Pager: [phone] 06/09/2021 2:55 PM   To contact Stroke Continuity provider, please refer to [email or web address] After hours, contact General Neurology

## 2021-06-09 NOTE — Progress Notes (Deleted)
Manufacturing engineer [facility]) Hospital Liaison note.   Received request from [place] for family interest in [facility] Chart and pt information have been reviewed by [facility] physician.  Hospice eligibility confirmed.  [place] is unable to offer a room today. Hospital Liaison will follow up tomorrow or sooner if a room becomes available. Please do not hesitate to call with questions.    Thank you for the opportunity to participate in this patient's care.  Domenic Moras, BSN, RN [facility] Liaison (listed on [place] under Hospice/[facility])    [phone] (24h on call)

## 2021-06-09 NOTE — Progress Notes (Signed)
Manufacturing engineer [facility]) Hospital Liaison note.    Chart and pt information have been reviewed by [facility] physician. [facility] eligibility confirmed.  [place] is unable to offer a room today. Hospital Liaison will follow up tomorrow or sooner if a room becomes available. Please do not hesitate to call with questions.    Thank you for the opportunity to participate in this patient's care.  Domenic Moras, BSN, RN [facility] Liaison (listed on [place] under Hospice/[facility])    [phone] (24h on call)

## 2021-06-09 NOTE — Progress Notes (Signed)
   Palliative Medicine Inpatient Follow Up Note  HPI: 65 y.o. female  with past medical history of mild dementia, CKD IV, pre DM, HLD, ischemic stroke, HTN, and depression, admitted on 05/31/2021 with unresponsiveness and hypertensive emergency.    Patient had seizure activity en route via EMS and was intubated. Imaging has shown ICH with IVH, SAH, and 8 mm midline shift. Hospital course was further complicated by AKI/CKD Stage IV. Patient was discussing dialysis as an outpatient and is not considered a candidate. PMT has been consulted to assist with goals of care conversation.  Today's Discussion (06/09/2021):  Chart reviewed. Discussed with RN and assessed patient at the bedside. She is resting comfortably, snoring at times. Met with patient's two sons and two other visitors, as well as patient's cousin via telephone, to discuss comfort care and specifically concerns regarding nutrition and artifical feeding.  Emotional support and therapeutic listening was provided as family shared their thoughts on potential starvation and discomfort from lack of nutrition.  Counseled on the rationale for avoiding artificial nutrition at end-of-life, including risk for prolonging the dying process and increasing symptom burden with little to no benefit.  Provided alternatives for showing love such as touch, conversation, and music.  Reassured family on the evidence-based research supporting this.  Family verbalizes understanding's and agrees that artificial feeding is not consistent with goals of care at this time.  Questions and concerns addressed.  Hard choices for loving people and gone from my sight booklets provided for review.  PMT will continue to support holistically.  Objective Assessment: Vital Signs Vitals:   06/08/21 1643 06/08/21 1949  BP: (!) 152/94 (!) 185/90  Pulse: 83 83  Resp: 20 18  Temp: 99.3 F (37.4 C) 98 F (36.7 C)  SpO2: 99% 100%    Intake/Output Summary (Last 24 hours) at  06/09/2021 1420 Last data filed at 06/09/2021 0900 Gross per 24 hour  Intake 300 ml  Output 825 ml  Net -525 ml    Last Weight  Most recent update: 06/07/2021  6:20 AM    Weight  80.1 kg (176 lb 9.4 oz)            Gen:  Older AA F, sleeping comfortably HEENT: Dry mucous membranes CV: Regular rate and rhythm  PULM: No respiratory distress  SUMMARY OF RECOMMENDATIONS   Continue comfort care, no changes to medications required today Educated family thoroughly on comfort care, particularly feeding Patient awaits [facility] bed PMT will continue to support  Time Spent: 35 minutes Greater than 50% of the time was spent in counseling and coordination of care  [place] Team Team Cell Phone: [phone] Please utilize secure chat with additional questions, if there is no response within 30 minutes please call the above phone number  Palliative Medicine Team providers are available by phone from 7am to 7pm daily and can be reached through the team cell phone.  Should this patient require assistance outside of these hours, please call the patient's attending physician.

## 2021-06-09 NOTE — Progress Notes (Signed)
PROGRESS NOTE    Christina Nunez  MWN:[id number]  DOB: 1956/03/30  PCP: Medicine, [place] Family Admit date:05/31/2021 Chief compliant: Unresponsiveness 65 yo female with a PMHx of mild dementia, CKD IV, pre DM, HLD, ischemic stroke, HTN, and depression admitted for unresponsiveness. Patient had seizure activity en route and was given Versed 2.45m per EMS.  ED Course: Patient was taken emergently to ED room, where she was intubated. Her BP was high. After intubation, the patient was taken to CT where scan showed ICH.  Hospital course: Patient admitted to Neuro ICU for further management of hemorrhagic stroke-large area of parenchymal hemorrhage centered within the right basal ganglia region. Subarachnoid extention also present with intraventricular extension (ICH 4). 88mleftward midline shift. aspirin 81 mg daily and clopidogrel 75 mg daily PTA-now held.PCCM consult to manage ETT and ventilator.3% saline given as 500cc bolus, then start 75cc/hr, Clevidipine drip for goal SBP<140.CTA head & neck No AVM or aneurysm. MRI brain stable hematoma, SAH, IVH and midline shift 8 mm. EEG no seizure but encephalopathy. Hosp course complicated by AKI on CKD (Cr 4 at baseline->6.65 on admission->peaked to 7.69) 10/19:Extubated-> moderate to severe respiratory distress -> sister decided no reintubation -> code status DNR 10/20:CT repeat stable hematoma, midline shift, no hydrocephalus.  10/22: Keppra discontinued as no more seizures. Increased coreg, Norvasc dose for BP control. Family met with palliative care, [facility]  Subjective: Patient unresponsive-nonverbal, not following commands, lethargic, mouth breathing on my arrival.  Sister and niece entered the room while I was leaving and had questions about comfort care when Dilaudid indications.  Sister stated her 2 sons are primary decision-makers but she is on the contact list as  well.  Objective: Vitals:   06/08/21 1100 06/08/21 1200 06/08/21 1643 06/08/21 1949  BP: (!) 159/96 (!) 158/89 (!) 152/94 (!) 185/90  Pulse:  74 83 83  Resp: 17 14 20 18   Temp:   99.3 F (37.4 C) 98 F (36.7 C)  TempSrc:   Oral Oral  SpO2: 98% 97% 99% 100%  Weight:      Height:        Intake/Output Summary (Last 24 hours) at 06/09/2021 1004 Last data filed at 06/09/2021 0900 Gross per 24 hour  Intake 649.99 ml  Output 1025 ml  Net -375.01 ml   Filed Weights   06/02/21 0500 06/04/21 0500 06/07/21 0500  Weight: 75.3 kg 78 kg 80.1 kg    Physical Examination:  General: Terminally ill appearing patient, unresponsive with eyes closed, mouth breathing. Heart: S1-S2 heard, regular rate and rhythm, no murmurs.  No leg edema noted Lungs: Equal air entry bilaterally, no rhonchi or rales on exam, no accessory muscle use Abdomen: Bowel sounds heard, soft, nontender, nondistended. No organomegaly.  No CVA tenderness Extremities: No pedal edema.  No cyanosis or clubbing. Neurological: Unresponsive, nonverbal, not moving any of her extremities.No eye movements either-unable to assess further.   Skin: No wounds or rashes.  Old midline lower abdominal surgical scar noted    Data Reviewed: I have personally reviewed following labs and imaging studies  CBC: Recent Labs  Lab 06/03/21 0652 06/04/21 0345 06/05/21 0452 06/06/21 0341 06/08/21 0348  WBC 13.5* 11.6* 12.6* 12.8* 8.1  HGB 8.6* 9.1* 9.6* 10.3* 9.1*  HCT 27.3* 28.4* 30.3* 33.0* 28.6*  MCV 91.9 91.9 91.8 93.2 91.1  PLT 93* 97* 94* 123* 17403 Basic Metabolic Panel: Recent Labs  Lab 06/03/21 0652 06/04/21 0345  06/05/21 0452 06/06/21 0341 06/08/21 0348  NA 152* 155* 150* 151* 146*  K 3.5 3.7 4.2 4.7 4.5  CL 129* 130* 127* 126* 121*  CO2 17* 18* 14* 13* 14*  GLUCOSE 229* 256* 209* 131* 202*  BUN 82* 89* 96* 107* 135*  CREATININE 7.38* 7.69* 7.56* 7.62* 7.51*  CALCIUM 8.2* 8.1* 7.8* 8.5* 8.2*   GFR: Estimated  Creatinine Clearance: 7.6 mL/min (A) (by C-G formula based on SCr of 7.51 mg/dL (H)). Liver Function Tests: No results for input(s): AST, ALT, ALKPHOS, BILITOT, PROT, ALBUMIN in the last 168 hours. No results for input(s): LIPASE, AMYLASE in the last 168 hours. No results for input(s): AMMONIA in the last 168 hours. Coagulation Profile: No results for input(s): INR, PROTIME in the last 168 hours. Cardiac Enzymes: No results for input(s): CKTOTAL, CKMB, CKMBINDEX, TROPONINI in the last 168 hours. BNP (last 3 results) No results for input(s): PROBNP in the last 8760 hours. HbA1C: No results for input(s): HGBA1C in the last 72 hours. CBG: Recent Labs  Lab 06/07/21 1541 06/07/21 1950 06/07/21 2338 06/08/21 0341 06/08/21 0823  GLUCAP 203* 193* 213* 167* 181*   Lipid Profile: No results for input(s): CHOL, HDL, LDLCALC, TRIG, CHOLHDL, LDLDIRECT in the last 72 hours. Thyroid Function Tests: No results for input(s): TSH, T4TOTAL, FREET4, T3FREE, THYROIDAB in the last 72 hours. Anemia Panel: No results for input(s): VITAMINB12, FOLATE, FERRITIN, TIBC, IRON, RETICCTPCT in the last 72 hours. Sepsis Labs: No results for input(s): PROCALCITON, LATICACIDVEN in the last 168 hours.  Recent Results (from the past 240 hour(s))  Resp Panel by RT-PCR (Flu A&B, Covid) Nasopharyngeal Swab     Status: None   Collection Time: 05/31/21  4:39 PM   Specimen: Nasopharyngeal Swab; Nasopharyngeal(NP) swabs in vial transport medium  Result Value Ref Range Status   SARS Coronavirus 2 by RT PCR NEGATIVE NEGATIVE Final    Comment: (NOTE) SARS-CoV-2 target nucleic acids are NOT DETECTED.  The SARS-CoV-2 RNA is generally detectable in upper respiratory specimens during the acute phase of infection. The lowest concentration of SARS-CoV-2 viral copies this assay can detect is 138 copies/mL. A negative result does not preclude SARS-Cov-2 infection and should not be used as the sole basis for treatment  or other patient management decisions. A negative result may occur with  improper specimen collection/handling, submission of specimen other than nasopharyngeal swab, presence of viral mutation(s) within the areas targeted by this assay, and inadequate number of viral copies(<138 copies/mL). A negative result must be combined with clinical observations, patient history, and epidemiological information. The expected result is Negative.  Fact Sheet for Patients:  EntrepreneurPulse.com.au  Fact Sheet for Healthcare Providers:  IncredibleEmployment.be  This test is no t yet approved or cleared by the [place] FDA and  has been authorized for detection and/or diagnosis of SARS-CoV-2 by FDA under an Emergency Use Authorization (EUA). This EUA will remain  in effect (meaning this test can be used) for the duration of the COVID-19 declaration under Section 564(b)(1) of the Act, 21 U.S.C.section 360bbb-3(b)(1), unless the authorization is terminated  or revoked sooner.       Influenza A by PCR NEGATIVE NEGATIVE Final   Influenza B by PCR NEGATIVE NEGATIVE Final    Comment: (NOTE) The Xpert Xpress SARS-CoV-2/FLU/RSV plus assay is intended as an aid in the diagnosis of influenza from Nasopharyngeal swab specimens and should not be used as a sole basis for treatment. Nasal washings and aspirates are unacceptable for Xpert Xpress SARS-CoV-2/FLU/RSV testing.  Fact Sheet for Patients: EntrepreneurPulse.com.au  Fact Sheet for Healthcare Providers: IncredibleEmployment.be  This test is not yet approved or cleared by the [place] FDA and has been authorized for detection and/or diagnosis of SARS-CoV-2 by FDA under an Emergency Use Authorization (EUA). This EUA will remain in effect (meaning this test can be used) for the duration of the COVID-19 declaration under Section 564(b)(1) of the Act, 21 U.S.C. section  360bbb-3(b)(1), unless the authorization is terminated or revoked.  Performed at [facility], [place] [address] [place], [place] [address]   MRSA Next Gen by PCR, Nasal     Status: None   Collection Time: 05/31/21  5:44 PM   Specimen: Nasal Mucosa; Nasal Swab  Result Value Ref Range Status   MRSA by PCR Next Gen NOT DETECTED NOT DETECTED Final    Comment: (NOTE) The GeneXpert MRSA Assay (FDA approved for NASAL specimens only), is one component of a comprehensive MRSA colonization surveillance program. It is not intended to diagnose MRSA infection nor to guide or monitor treatment for MRSA infections. Test performance is not FDA approved in patients less than 19 years old. Performed at [facility], [place] [address], [place], [place] [address]   Culture, Respiratory w Gram Stain     Status: None   Collection Time: 06/02/21  8:27 AM   Specimen: Tracheal Aspirate; Respiratory  Result Value Ref Range Status   Specimen Description TRACHEAL ASPIRATE  Final   Special Requests NONE  Final   Gram Stain   Final    FEW WBC PRESENT, PREDOMINANTLY MONONUCLEAR MODERATE GRAM POSITIVE COCCI IN PAIRS AND CHAINS RARE GRAM NEGATIVE RODS    Culture   Final    MODERATE Normal respiratory flora-no Staph aureus or Pseudomonas seen Performed at [facility], [address], [place], [place] [address]    Report Status 06/04/2021 FINAL  Final      Radiology Studies: No results found.    Scheduled Meds:  chlorhexidine  15 mL Mouth Rinse BID   glycopyrrolate  0.4 mg Intravenous Q4H    HYDROmorphone (DILAUDID) injection  1 mg Intravenous TID   Continuous Infusions:    Assessment/Plan:  This is an unfortunate 65 year old female with history of CKD stage IV, hyperlipidemia, hypertension, mild dementia/depression who was residing with her sister and apparently quite active at baseline who presented with unresponsiveness and seizure activity, work-up revealing a large hemorrhagic  stroke with cerebral edema/midline shift.  She was managed in the neuro ICU and had no meaningful neurological improvement.  After several discussions with family members she was made DNR/comfort care only.  Palliative care/hospice team following.  Plan [facility] to [facility] once bed [facility].  Sister and niece had questions about her terminal state/prognosis and seem to understand comfort care goals/current medication regimen prescribed by hospice team after discussion.  They appreciated care provided  Code Status: DNR Family / Patient Communication: Discussed with niece and sister at bedside.  Other family members been intermittently visiting as well. Disposition Plan: To [facility] once bed available. Status is: Inpatient  Remains inpatient appropriate because: End-of-life comfort care           Time spent: 30 mins     >50% time spent in discussions with care team and coordination of care.    Guilford Shi, MD [facility] Pager in [place]  If 7PM-7AM, please contact night-coverage [email or web address] 06/09/2021, 10:04 AM

## 2021-06-10 DIAGNOSIS — G936 Cerebral edema: Secondary | ICD-10-CM | POA: Diagnosis present

## 2021-06-10 DIAGNOSIS — R569 Unspecified convulsions: Secondary | ICD-10-CM

## 2021-06-10 DIAGNOSIS — I619 Nontraumatic intracerebral hemorrhage, unspecified: Secondary | ICD-10-CM | POA: Diagnosis not present

## 2021-06-10 DIAGNOSIS — Z515 Encounter for palliative care: Secondary | ICD-10-CM | POA: Diagnosis not present

## 2021-06-10 DIAGNOSIS — J9602 Acute respiratory failure with hypercapnia: Secondary | ICD-10-CM | POA: Diagnosis not present

## 2021-06-10 DIAGNOSIS — N186 End stage renal disease: Secondary | ICD-10-CM | POA: Diagnosis not present

## 2021-06-10 DIAGNOSIS — D631 Anemia in chronic kidney disease: Secondary | ICD-10-CM | POA: Diagnosis present

## 2021-06-10 DIAGNOSIS — N189 Chronic kidney disease, unspecified: Secondary | ICD-10-CM | POA: Diagnosis present

## 2021-06-10 DIAGNOSIS — J9601 Acute respiratory failure with hypoxia: Secondary | ICD-10-CM | POA: Diagnosis not present

## 2021-06-10 DIAGNOSIS — Z7189 Other specified counseling: Secondary | ICD-10-CM | POA: Diagnosis not present

## 2021-06-10 DIAGNOSIS — I609 Nontraumatic subarachnoid hemorrhage, unspecified: Secondary | ICD-10-CM

## 2021-06-10 DIAGNOSIS — I629 Nontraumatic intracranial hemorrhage, unspecified: Secondary | ICD-10-CM | POA: Diagnosis not present

## 2021-06-10 DIAGNOSIS — R1310 Dysphagia, unspecified: Secondary | ICD-10-CM

## 2021-06-10 MED ORDER — BIOTENE DRY MOUTH MT LIQD: 15.0000 mL | OROMUCOSAL | 0 refills | Status: AC | PRN

## 2021-06-10 MED ORDER — ONDANSETRON HCL 4 MG/2ML IJ SOLN: 4.0000 mg | Freq: Four times a day (QID) | INTRAMUSCULAR | 0 refills | Status: AC | PRN

## 2021-06-10 MED ORDER — POLYVINYL ALCOHOL 1.4 % OP SOLN: 1.0000 [drp] | Freq: Four times a day (QID) | OPHTHALMIC | 0 refills | Status: AC | PRN

## 2021-06-10 MED ORDER — HALOPERIDOL LACTATE 2 MG/ML PO CONC: 0.5000 mg | ORAL | 0 refills | Status: AC | PRN

## 2021-06-10 MED ORDER — ONDANSETRON 4 MG PO TBDP: 4.0000 mg | ORAL_TABLET | Freq: Four times a day (QID) | ORAL | 0 refills | Status: AC | PRN

## 2021-06-10 MED ORDER — GLYCOPYRROLATE 0.2 MG/ML IJ SOLN: 0.4000 mg | INTRAMUSCULAR | 0 refills | Status: AC

## 2021-06-10 MED ORDER — ACETAMINOPHEN 650 MG RE SUPP: 650.0000 mg | Freq: Four times a day (QID) | RECTAL | 0 refills | Status: AC | PRN

## 2021-06-10 MED ORDER — HYDROMORPHONE HCL 1 MG/ML IJ SOLN: 1.0000 mg | Freq: Three times a day (TID) | INTRAMUSCULAR | 0 refills | Status: AC

## 2021-06-10 MED ORDER — LORAZEPAM 2 MG/ML IJ SOLN: 0.5000 mg | INTRAMUSCULAR | 0 refills | Status: AC | PRN

## 2021-06-10 NOTE — Progress Notes (Signed)
Daily Progress Note   Patient Name: Christina Nunez       Date: 06/10/2021 DOB: 08-18-1956  Age: 65 y.o. MRN#: [id number] Attending Physician: Stroke, Md, MD Primary Care Physician: Medicine, [place] Family Admit Date: 05/31/2021  Reason for Consultation/Follow-up: Establishing goals of care and Terminal Care  Patient Profile/HPI:  65 y.o. female  with past medical history of mild dementia, CKD IV, pre DM, HLD, ischemic stroke, HTN, and depression, admitted on 05/31/2021 with unresponsiveness and hypertensive emergency.    Patient had seizure activity en route via EMS and was intubated. Imaging has shown ICH with IVH, SAH, and 8 mm midline shift. Hospital course was further complicated by AKI/CKD Stage IV. Patient was discussing dialysis as an outpatient and is not considered a candidate. PMT has been consulted to assist with goals of care conversation.  Subjective: Christina Nunez appears to be resting comfortably. Per nurse report- she has just received hydromorphone for comfort.  Son is at bedside- he has just gotten off of phone with hospice representative. There is a bed available at [facility] today.   Review of Systems  Unable to perform ROS: Acuity of condition    Physical Exam Vitals and nursing note reviewed.  Constitutional:      General: She is not in acute distress.    Appearance: She is ill-appearing.  Cardiovascular:     Rate and Rhythm: Normal rate.     Pulses: Normal pulses.  Pulmonary:     Effort: Pulmonary effort is normal.  Skin:    General: Skin is warm and dry.     Comments: Extremities warm, not mottling  Neurological:     Comments: No response to voice or touch            Vital Signs: BP (!) 196/97 (BP Location: Left Arm)   Pulse (!) 107   Temp  98.1 F (36.7 C) (Oral)   Resp 16   Ht 5\' 3"  (1.6 m)   Wt 80.1 kg   SpO2 96%   BMI 31.28 kg/m  SpO2: SpO2: 96 % O2 Device: O2 Device: Room Air O2 Flow Rate: O2 Flow Rate (L/min): 0 L/min  Intake/output summary: No intake or output data in the 24 hours ending 06/10/21 1231 LBM: Last BM Date: 06/06/21 Baseline Weight: Weight: 70 kg Most recent weight:  Weight: 80.1 kg       Palliative Assessment/Data: PPS: 10%      Patient Active Problem List   Diagnosis Date Noted   Intracerebral hemorrhage 06/06/2021   ESRD (end stage renal disease) ([place])    Goals of care, counseling/discussion    Intracranial hemorrhage ([place]) 05/31/2021   Endotracheally intubated    Acute respiratory failure ([place])    Hyperglycemia    Acute ischemic stroke ([place]) 08/16/2020   Memory deficit 08/14/2020   AKI (acute kidney injury) ([place]) 08/14/2020   History of ischemic heart disease 08/14/2020   Depression 08/14/2020   Hypertensive urgency 08/14/2020   Essential hypertension 08/13/2020   Hypertensive emergency 08/13/2020    Palliative Care Assessment & Plan    Assessment/Recommendations/Plan  Continue current plan for comfort Await transfer to [facility] DNR form signed and placed on chart   Code Status: DNR  Prognosis:  < 2 weeks  Discharge Planning: Hospice facility  Care plan was discussed with  family.   Thank you for allowing the Palliative Medicine Team to assist in the care of this patient.  Total time:  42 minutes Prolonged billing: No     Greater than 50%  of this time was spent counseling and coordinating care related to the above assessment and plan.  Mariana Kaufman, AGNP-C Palliative Medicine   Please contact Palliative Medicine Team phone at [phone] for questions and concerns.

## 2021-06-10 NOTE — Care Management (Signed)
Audrea Muscat with [facility] called, consents are signed and they are ready for patient to be transported to [facility]. PTAR called, DNR and PTAR paperwork in chart. Nurse aware.

## 2021-06-10 NOTE — Progress Notes (Addendum)
STROKE TEAM PROGRESS NOTE   INTERVAL HISTORY She is lying in bed, NAD appears comfortable. She is obtunded and unresponsive this am. Her son is at bedside, updated him that she is progressing to EOL and we will ensure that she is comfortable. Informed him we are still waiting for a bed for potential transfer to [facility].  She does flicker to pain on the right upper extremity.   Vitals:   06/08/21 1200 06/08/21 1643 06/08/21 1949 06/10/21 0715  BP: (!) 158/89 (!) 152/94 (!) 185/90 (!) 196/97  Pulse: 74 83 83 (!) 107  Resp: 14 20 18 16   Temp:  99.3 F (37.4 C) 98 F (36.7 C) 98.1 F (36.7 C)  TempSrc:  Oral Oral Oral  SpO2: 97% 99% 100% 96%  Weight:      Height:       CBC:  Recent Labs  Lab 06/06/21 0341 06/08/21 0348  WBC 12.8* 8.1  HGB 10.3* 9.1*  HCT 33.0* 28.6*  MCV 93.2 91.1  PLT 123* 448   Basic Metabolic Panel:  Recent Labs  Lab 06/06/21 0341 06/08/21 0348  NA 151* 146*  K 4.7 4.5  CL 126* 121*  CO2 13* 14*  GLUCOSE 131* 202*  BUN 107* 135*  CREATININE 7.62* 7.51*  CALCIUM 8.5* 8.2*    IMAGING past 24 hours No results found.  PHYSICAL EXAM  Temp:  [98.1 F (36.7 C)] 98.1 F (36.7 C) (10/24 0715) Pulse Rate:  [107] 107 (10/24 0715) Resp:  [16] 16 (10/24 0715) BP: (196)/(97) 196/97 (10/24 0715) SpO2:  [96 %] 96 % (10/24 0715)  General - Well nourished, well developed middle-aged African-American lady in no apparent distress.  Ophthalmologic - fundi not visualized due to noncooperation.  Cardiovascular - Regular rhythm and rate.  Mental Status -  Obtunded and unresponsive. Flickers to painful stimuli on right upper extremity  ASSESSMENT/PLAN [name] is a 65 yo female with a PMHx of mild dementia, CKD IV, pre DM, HLD, ischemic stroke, HTN, and depression admitted for unresponsiveness. Patient had seizure activity en route and was given Versed 2.5mg  per EMS.    ICH - right BG large ICH with IVH and SAH, likely due to hypertensive  emergency [place] - large area of parenchymal hemorrhage centered within the right basal ganglia region. Subarachnoid extention also present with intraventricular extension (ICH 4). 75mm leftward midline shift.  CTA head & neck No AVM or aneurysm CT repeat stable hematoma, midline shift, no hydrocephalus MRI brain stable hematoma, SAH, IVH and midline shift 8 mm CT repeat 10/20 unchanged ICH and IVH as well as midline shift 2D Echo EF 60 to 65% EEG no seizure but encephalopathy LDL 128 HgbA1c 6.3 VTE prophylaxis - SCDs aspirin 81 mg daily and clopidogrel 75 mg daily prior to admission, now on No antithrombotic given ICH Disposition: [place] when bed available    Cerebral edema CT and MRI showed stable midline shift 8 mm On 3% saline @ 75 -> FW 200 Q4h ->NS @ 50->off Na 142-149-153-163-163-160->157->152->155->150->151->146 Allow Na trending down gradually CT repeat unchanged ICH and IVH as well as midline shift   Respiratory failure Intubated on ventilation Stable mental status CCM on board Extubated on 10/19 -> moderate to severe respiratory distress -> sister decided no reintubation -> code status DNR Palliative care on board  AKI on CKD  Cre 6.65->6.90->6.76->7.17->7.38->7.69->7.56->7.62->7.51 Baseline around 4 in 08/2020 Per sister, patient deemed not a candidate for dialysis by her nephrologist D/c IVF, on TF Continue  to be oligouria  CCM on board Nephrology consulted and agree with not candidate for dialysis   Seizure Witnessed seizure with EMS, stat post Versed 2.5 mg Currently no clinical seizure EEG no seizure but encephalopathy Keppra 250 twice daily->d/c due to potential sedative effect and no more clinical seizure seen   History of stroke 07/2020 admitted for right CR/SO lacunar infarct on MRI.  CT no acute abnormality.  MRI also showed old left cerebellum, left BG and thalamic, bilateral SO and CR infarcts.  MRI head and neck bilateral siphon moderate  atherosclerosis, left more than right.  DVT negative.  EF 50 to 55%.  LDL 128, A1c 6.3.  Patient discharged with DAPT and Lipitor 40.   Hypertensive emergency Home meds: Lasix, Coreg 6.25 Stable, off Cleviprex Labetalol IV PRN On amlodipine 5->10 and Coreg 12.5->25 Long-term BP goal normotensive   Hyperlipidemia Home meds: Lipitor 40,  LDL 128, goal < 70 May consider statin at discharge   Dysphagia NPO   Other Stroke Risk Factors CAD status post CABG   Other Active Problems Leukocytosis resolved WBC 8.1 Anemia of chronic disease, hemoglobin 11.1->8.8->8.6->9.1->9.6->10.3->9.1    Hospital day # [place], NP  I have personally obtained history,examined this patient, reviewed notes, independently viewed imaging studies, participated in medical decision making and plan of care.ROS completed by me personally and pertinent positives fully documented  I have made any additions or clarifications directly to the above note. Agree with note above.  Await pending residential hospice nursing home for transfer.  Discussed with son at the bedside and answered questions.  Antony Contras, MD Medical Director [place] Pager: [phone] 06/10/2021 12:52 PM   To contact Stroke Continuity provider, please refer to [email or web address] After hours, contact General Neurology

## 2021-06-10 NOTE — Progress Notes (Signed)
Patient seen and examined.  She was unresponsive and obtunded.  Her son was at the bedside and was engaged in communication.  Intracranial hemorrhage End-of-life Hospice and palliation Acute renal failure Seizure disorder  Plan: Continue all symptom control. Can transfer to hospice home today when bed available. Thank you for involving internal medicine services on her care, if neurology needs help with discharge, will prepare discharge. Medicate for comfort before transferring in EMS.  Total time spent: 15 minutes

## 2021-06-10 NOTE — Discharge Summary (Addendum)
Stroke Discharge Summary  Patient ID: Christina Nunez   MRN: [id number]      DOB: February 03, 1956  Date of Admission: 05/31/2021 Date of Discharge: 06/10/2021  Attending Physician:  Stroke, Md, MD, Stroke MD Consultant(s):    cardiology, pulmonary/intensive care, nephrology, and palliative care   Patient's PCP:  Medicine, [place] DIAGNOSIS: Intracranial hemorrhage Principal Problem:   Intracranial hemorrhage ([place]) Active Problems:   Essential hypertension   AKI (acute kidney injury) ([place])   ESRD (end stage renal disease) ([place])   Goals of care, counseling/discussion   Intracerebral hemorrhage   Cerebral edema ([place])   Seizures ([place])   Dysphagia   Anemia in chronic kidney disease   Subarachnoid hemorrhage ([place])   Allergies as of 06/10/2021       Reactions   Latex Itching, Rash        Medication List     STOP taking these medications    amLODipine 2.5 MG tablet Commonly known as: NORVASC   atorvastatin 40 MG tablet Commonly known as: LIPITOR   carvedilol 6.25 MG tablet Commonly known as: COREG   furosemide 80 MG tablet Commonly known as: LASIX   pantoprazole 40 MG tablet Commonly known as: PROTONIX   sodium bicarbonate 650 MG tablet   Vitamin D (Ergocalciferol) 1.25 MG (50000 UNIT) Caps capsule Commonly known as: DRISDOL       TAKE these medications    acetaminophen 650 MG suppository Commonly known as: TYLENOL Place 1 suppository (650 mg total) rectally every 6 (six) hours as needed for mild pain (or Fever >/= 101).   antiseptic oral rinse Liqd Apply 15 mLs topically as needed for dry mouth.   glycopyrrolate 0.2 MG/ML injection Commonly known as: ROBINUL Inject 2 mLs (0.4 mg total) into the vein every 4 (four) hours.   haloperidol 2 MG/ML solution Commonly known as: HALDOL Place 0.3 mLs (0.6 mg total) under the tongue every 4 (four) hours as needed for agitation (or delirium).   HYDROmorphone 1 MG/ML  injection Commonly known as: DILAUDID Inject 1 mL (1 mg total) into the vein 3 (three) times daily.   LORazepam 2 MG/ML injection Commonly known as: ATIVAN Inject 0.25-0.5 mLs (0.5-1 mg total) into the vein every hour as needed for anxiety, seizure or sedation.   ondansetron 4 MG disintegrating tablet Commonly known as: ZOFRAN-ODT Take 1 tablet (4 mg total) by mouth every 6 (six) hours as needed for nausea.   ondansetron 4 MG/2ML Soln injection Commonly known as: ZOFRAN Inject 2 mLs (4 mg total) into the vein every 6 (six) hours as needed for nausea.   polyvinyl alcohol 1.4 % ophthalmic solution Commonly known as: LIQUIFILM TEARS Place 1 drop into both eyes 4 (four) times daily as needed for dry eyes.        LABORATORY STUDIES CBC    Component Value Date/Time   WBC 8.1 06/08/2021 0348   RBC 3.14 (L) 06/08/2021 0348   HGB 9.1 (L) 06/08/2021 0348   HCT 28.6 (L) 06/08/2021 0348   PLT 170 06/08/2021 0348   MCV 91.1 06/08/2021 0348   MCH 29.0 06/08/2021 0348   MCHC 31.8 06/08/2021 0348   RDW 15.6 (H) 06/08/2021 0348   LYMPHSABS 1.6 05/31/2021 1600   MONOABS 0.5 05/31/2021 1600   EOSABS 0.0 05/31/2021 1600   BASOSABS 0.0 05/31/2021 1600   CMP    Component Value Date/Time   NA 146 (H) 06/08/2021 0348   K 4.5 06/08/2021 0348  CL 121 (H) 06/08/2021 0348   CO2 14 (L) 06/08/2021 0348   GLUCOSE 202 (H) 06/08/2021 0348   BUN 135 (H) 06/08/2021 0348   CREATININE 7.51 (H) 06/08/2021 0348   CALCIUM 8.2 (L) 06/08/2021 0348   PROT 6.8 05/31/2021 1600   ALBUMIN 3.2 (L) 05/31/2021 1600   AST 61 (H) 05/31/2021 1600   ALT 25 05/31/2021 1600   ALKPHOS 68 05/31/2021 1600   BILITOT 0.8 05/31/2021 1600   GFRNONAA 6 (L) 06/08/2021 0348   COAGS Lab Results  Component Value Date   INR 1.1 05/31/2021   Lipid Panel    Component Value Date/Time   CHOL 156 05/31/2021 1902   TRIG 184 (H) 06/01/2021 0448   HDL 33 (L) 05/31/2021 1902   CHOLHDL 4.7 05/31/2021 1902   VLDL 36  05/31/2021 1902   LDLCALC 87 05/31/2021 1902   HgbA1C  Lab Results  Component Value Date   HGBA1C 6.0 (H) 05/31/2021   Urinalysis    Component Value Date/Time   COLORURINE YELLOW 06/01/2021 0919   APPEARANCEUR HAZY (A) 06/01/2021 0919   LABSPEC 1.034 (H) 06/01/2021 0919   PHURINE 5.0 06/01/2021 0919   GLUCOSEU 150 (A) 06/01/2021 0919   HGBUR MODERATE (A) 06/01/2021 0919   BILIRUBINUR NEGATIVE 06/01/2021 0919   KETONESUR 5 (A) 06/01/2021 0919   PROTEINUR >=300 (A) 06/01/2021 0919   NITRITE NEGATIVE 06/01/2021 0919   LEUKOCYTESUR NEGATIVE 06/01/2021 0919   Urine Drug Screen No results found for: LABOPIA, COCAINSCRNUR, LABBENZ, AMPHETMU, THCU, LABBARB  Alcohol Level    Component Value Date/Time   ETH <10 05/31/2021 1902     SIGNIFICANT DIAGNOSTIC STUDIES CT HEAD WO CONTRAST (5MM)  Result Date: 06/06/2021 CLINICAL DATA:  Intracranial hemorrhage, follow-up EXAM: CT HEAD WITHOUT CONTRAST TECHNIQUE: Contiguous axial images were obtained from the base of the skull through the vertex without intravenous contrast. COMPARISON:  Recent CT and MR imaging FINDINGS: Brain: Large hemorrhage again identified within the right cerebral hemisphere centered within the basal ganglia and surrounding white matter. Right greater than left cerebral sulcal subarachnoid hemorrhage is again identified. Intraventricular hemorrhage is also again noted. There has been no substantial change allowing for some interval evolution and redistribution. Edema surrounding the parenchymal hemorrhage is similar as is the resulting mass effect. Persistent effacement of right lateral ventricle with leftward midline shift still measuring approximately 8 mm. There is no trapping of the left lateral ventricle. There is no new loss of gray-white differentiation. Vascular: There is atherosclerotic calcification at the skull base. Skull: Calvarium is unremarkable. Sinuses/Orbits: No acute finding. Other: None. IMPRESSION: No  substantial change since most recent imaging in large parenchymal hemorrhage centered within the basal ganglia with sulcal and intraventricular subarachnoid extension. There is no hydrocephalus. Leftward midline shift remains 8 mm. Electronically Signed   By: Macy Mis M.D.   On: 06/06/2021 17:27   CT HEAD WO CONTRAST  Result Date: 05/31/2021 CLINICAL DATA:  Parenchymal hemorrhage follow-up EXAM: CT HEAD WITHOUT CONTRAST TECHNIQUE: Contiguous axial images were obtained from the base of the skull through the vertex without intravenous contrast. COMPARISON:  05/31/2021 at 4:24 p.m. FINDINGS: Brain: Unchanged appearance of massive intraparenchymal hematoma centered in the right basal ganglia with leftward midline shift of approximately 8 mm. There is subarachnoid blood again seen over both hemispheres. The size and configuration of the ventricles are unchanged. Vascular: No abnormal hyperdensity of the major intracranial arteries or dural venous sinuses. No intracranial atherosclerosis. Skull: The visualized skull base, calvarium and extracranial soft tissues  are normal. Sinuses/Orbits: No fluid levels or advanced mucosal thickening of the visualized paranasal sinuses. No mastoid or middle ear effusion. The orbits are normal. IMPRESSION: 1. Unchanged appearance of massive intraparenchymal hematoma centered in the right basal ganglia with leftward midline shift of approximately 8 mm. 2. Unchanged subarachnoid blood over both hemispheres. Electronically Signed   By: Ulyses Jarred M.D.   On: 05/31/2021 22:24   MR BRAIN WO CONTRAST  Result Date: 06/01/2021 EXAM: MRI HEAD WITHOUT CONTRAST TECHNIQUE: Multiplanar, multiecho pulse sequences of the brain and surrounding structures were obtained without intravenous contrast. COMPARISON:  CT May 31, 2021. FINDINGS: Brain: Very large intraparenchymal hematoma centered in the right basal ganglia, likely similar in size when comparing across modalities. The  hemorrhage measures up to approximately 6.3 x 4.3 x 4.9 cm.Similar effacement of the right lateral ventricle with approximately 8 mm of leftward midline shift. Similar moderate volume of intraventricular hemorrhage and scattered subarachnoid hemorrhage. No evidence of surrounding infarct. No definite underlying mass; however, the hemorrhage is very heterogeneous and acute blood products limits evaluation. Edema in the adjacent brain parenchyma, including the basal ganglia and surrounding frontal, parietal and temporal lobes. Similar ventricular size. Vascular: Major arterial flow voids are maintained at the skull base. Skull and upper cervical spine: Normal marrow signal. Sinuses/Orbits: Mild paranasal sinus mucosal thickening. Other: No sizable mastoid effusions. IMPRESSION: 1. Very large intraparenchymal hematoma centered in the right basal ganglia, likely similar in size when comparing across modalities. Similar effacement of the right lateral ventricle with approximately 8 mm of leftward midline shift. No evidence of surrounding infarct to suggest hemorrhagic conversion of infarct. No definite underlying mass; however, acute blood products limits evaluation. A follow up mri with contrast may be useful to fully exclude underlying mass. 2. Similar moderate volume of intraventricular hemorrhage and scattered subarachnoid hemorrhage. Similar ventricular size. Electronically Signed   By: Margaretha Sheffield M.D.   On: 06/01/2021 18:13   DG Chest Port 1 View  Result Date: 06/02/2021 CLINICAL DATA:  Shortness of breath.  History of CABG. EXAM: PORTABLE CHEST 1 VIEW COMPARISON:  Chest x-rays dated 05/31/2021 and 08/13/2020. FINDINGS: Stable cardiomegaly. Endotracheal tube and enteric tube are stable in position. Lungs are clear. No pleural effusion or pneumothorax is seen. IMPRESSION: No active disease. No evidence of pneumonia or pulmonary edema. Stable cardiomegaly. Electronically Signed   By: Franki Cabot M.D.    On: 06/02/2021 08:27   DG CHEST PORT 1 VIEW  Result Date: 05/31/2021 CLINICAL DATA:  Check gastric catheter placement EXAM: PORTABLE CHEST 1 VIEW COMPARISON:  Film from earlier in the same day. FINDINGS: Endotracheal tube and gastric catheter are again noted in satisfactory position. Cardiac shadow is stable. Postsurgical changes are again seen. Lungs are clear. No bony abnormality is noted. IMPRESSION: Tubes and lines as described above.  No acute abnormality noted. Electronically Signed   By: Inez Catalina M.D.   On: 05/31/2021 19:42   DG Chest Portable 1 View  Result Date: 05/31/2021 CLINICAL DATA:  Status post intubation. EXAM: PORTABLE CHEST 1 VIEW COMPARISON:  August 23, 2020 FINDINGS: An endotracheal tube is seen with its distal tip approximately 2.1 cm from the carina. A nasogastric tube is noted with its distal end within the body of the stomach. Multiple sternal wires and vascular clips are seen. The heart size and mediastinal contours are within normal limits. Both lungs are clear. The visualized skeletal structures are unremarkable. IMPRESSION: 1. Endotracheal tube positioning, as described above. 2. No acute or  active cardiopulmonary disease. Electronically Signed   By: Virgina Norfolk M.D.   On: 05/31/2021 16:35   DG Abd Portable 1V  Result Date: 06/05/2021 CLINICAL DATA:  Feeding tube placement EXAM: PORTABLE ABDOMEN - 1 VIEW COMPARISON:  08/17/2020 FINDINGS: Feeding tube with weighted tip has been advanced to the gastric antrum. The stomach is decompressed. Visualized bowel gas pattern is normal. The lower abdomen is excluded. IMPRESSION: Feeding tube to the gastric antrum Electronically Signed   By: Lucrezia Europe M.D.   On: 06/05/2021 10:36   EEG adult  Result Date: 06/02/2021 Christina Doom, MD     06/02/2021  7:37 PM History: 65 year old female being evaluated for previously seen seizure activity. Sedation: None Technique: This EEG was acquired with electrodes placed  according to the International 10-20 electrode system (including Fp1, Fp2, F3, F4, C3, C4, P3, P4, O1, O2, T3, T4, T5, T6, A1, A2, Fz, Cz, Pz). The following electrodes were missing or displaced: none. Background: There is a posterior dominant rhythm of 7 to 8 Hz which is seen better on the left than right.  Faster frequencies are attenuated throughout the right hemisphere.  There is diffuse irregular slow activity present throughout the study.  In addition, there are frequent periodic discharges with a frequency of 1 to 1.5 Hz with a shifting bifrontal predominance and triphasic morphology. Photic stimulation: Physiologic driving is not performed EEG Abnormalities: 1) triphasic waves 2) attenuation of faster frequencies on the right 3) generalized irregular slow activity 4) slow posterior dominant rhythm Clinical Interpretation: This EEG is consistent with a focal right hemispheric dysfunction (consistent with the known hemorrhagic stroke) in the setting of a generalized nonspecific cerebral dysfunction (encephalopathy). Triphasic waves are usually an encephalopathic pattern, but can represent cortical irritability in some circumstances.  If a high concern for seizures exists, would consider continuous EEG monitoring. There was no seizure or definite seizure predisposition recorded on this study. Please note that lack of epileptiform activity on EEG does not preclude the possibility of epilepsy. Roland Rack, MD [facility] [phone] If 7pm- 7am, please page neurology on call as listed in [place].   ECHOCARDIOGRAM COMPLETE  Result Date: 06/01/2021    ECHOCARDIOGRAM REPORT   Patient Name:   Christina Nunez Date of Exam: 06/01/2021 Medical Rec #:  [id number]    Height:       63.0 in Accession #:    [id number]   Weight:       154.3 lb Date of Birth:  09/24/1955    BSA:          1.732 m Patient Age:    15 years     BP:           140/78 mmHg Patient Gender: F            HR:           93 bpm. Exam  Location:  Inpatient Procedure: 2D Echo, Cardiac Doppler and Color Doppler Indications:    Stroke  History:        Patient has prior history of Echocardiogram examinations, most                 recent 08/14/2020. CHF.  Sonographer:    Merrie Roof RDCS Referring Phys: [place]  1. Left ventricular ejection fraction, by estimation, is 60 to 65%. The left ventricle has normal function. The left ventricle has no regional wall motion abnormalities. Left ventricular diastolic parameters are consistent with Grade I diastolic  dysfunction (impaired relaxation). Elevated left ventricular end-diastolic pressure. The E/e' is 30.  2. Right ventricular systolic function is normal. The right ventricular size is normal. Tricuspid regurgitation signal is inadequate for assessing PA pressure.  3. Left atrial size was mildly dilated.  4. The mitral valve is grossly normal. Trivial mitral valve regurgitation. No evidence of mitral stenosis.  5. The aortic valve is tricuspid. Aortic valve regurgitation is not visualized. No aortic stenosis is present.  6. The inferior vena cava is normal in size with greater than 50% respiratory variability, suggesting right atrial pressure of 3 mmHg. Comparison(s): No significant change from prior study. EF normal. No WMA. Postop septum. FINDINGS  Left Ventricle: Left ventricular ejection fraction, by estimation, is 60 to 65%. The left ventricle has normal function. The left ventricle has no regional wall motion abnormalities. The left ventricular internal cavity size was normal in size. There is  no left ventricular hypertrophy. Abnormal (paradoxical) septal motion consistent with post-operative status. Left ventricular diastolic parameters are consistent with Grade I diastolic dysfunction (impaired relaxation). Elevated left ventricular end-diastolic pressure. The E/e' is 64. Right Ventricle: The right ventricular size is normal. No increase in right ventricular wall  thickness. Right ventricular systolic function is normal. Tricuspid regurgitation signal is inadequate for assessing PA pressure. Left Atrium: Left atrial size was mildly dilated. Right Atrium: Right atrial size was normal in size. Pericardium: There is no evidence of pericardial effusion. Presence of pericardial fat pad. Mitral Valve: The mitral valve is grossly normal. Trivial mitral valve regurgitation. No evidence of mitral valve stenosis. Tricuspid Valve: The tricuspid valve is grossly normal. Tricuspid valve regurgitation is trivial. No evidence of tricuspid stenosis. Aortic Valve: The aortic valve is tricuspid. Aortic valve regurgitation is not visualized. No aortic stenosis is present. Aortic valve mean gradient measures 5.0 mmHg. Aortic valve peak gradient measures 8.8 mmHg. Pulmonic Valve: The pulmonic valve was grossly normal. Pulmonic valve regurgitation is not visualized. No evidence of pulmonic stenosis. Aorta: The aortic root is normal in size and structure. Venous: The right lower pulmonary vein is normal. The inferior vena cava is normal in size with greater than 50% respiratory variability, suggesting right atrial pressure of 3 mmHg. IAS/Shunts: The atrial septum is grossly normal.  LEFT VENTRICLE PLAX 2D LVIDd:         3.00 cm Diastology LVIDs:         2.10 cm LV e' medial:    3.23 cm/s LV PW:         1.10 cm LV E/e' medial:  22.0 LV IVS:        1.00 cm LV e' lateral:   4.35 cm/s                        LV E/e' lateral: 16.3  RIGHT VENTRICLE RV Basal diam:  2.90 cm LEFT ATRIUM             Index        RIGHT ATRIUM           Index LA diam:        3.60 cm 2.08 cm/m   RA Area:     16.70 cm LA Vol (A2C):   71.6 ml 41.34 ml/m  RA Volume:   42.10 ml  24.31 ml/m LA Vol (A4C):   52.5 ml 30.31 ml/m LA Biplane Vol: 64.6 ml 37.30 ml/m  AORTIC VALVE AV Vmax:           148.00  cm/s AV Vmean:          96.400 cm/s AV VTI:            0.236 m AV Peak Grad:      8.8 mmHg AV Mean Grad:      5.0 mmHg LVOT Vmax:          129.00 cm/s LVOT Vmean:        79.200 cm/s LVOT VTI:          0.194 m LVOT/AV VTI ratio: 0.82  AORTA Ao Root diam: 2.90 cm MITRAL VALVE MV Area (PHT): 4.41 cm     SHUNTS MV Decel Time: 172 msec     Systemic VTI: 0.19 m MV E velocity: 71.00 cm/s MV A velocity: 104.00 cm/s MV E/A ratio:  0.68 Christina Chiquito MD Electronically signed by Christina Chiquito MD Signature Date/Time: 06/01/2021/12:02:37 PM    Final    CT ANGIO HEAD NECK W WO CM (CODE STROKE)  Result Date: 05/31/2021 CLINICAL DATA:  Neuro deficit, acute, stroke suspected EXAM: CT ANGIOGRAPHY HEAD AND NECK TECHNIQUE: Multidetector CT imaging of the head and neck was performed using the standard protocol during bolus administration of intravenous contrast. Multiplanar CT image reconstructions and MIPs were obtained to evaluate the vascular anatomy. Carotid stenosis measurements (when applicable) are obtained utilizing NASCET criteria, using the distal internal carotid diameter as the denominator. CONTRAST:  72mL OMNIPAQUE IOHEXOL 350 MG/ML SOLN COMPARISON:  None. FINDINGS: CT HEAD Brain: There is a large area of hemorrhage within the right cerebral hemisphere centered within the basal ganglia and surrounding white matter. Approximate measurements of 5.8 x 4.5 x 5.6 cm. There is adjacent sulcal subarachnoid hemorrhage with involvement of sylvian fissure and right MCA cistern. Some contralateral sulcal subarachnoid hemorrhage is present and could be post-traumatic or reflect recirculation. Intraventricular extension is present. There is surrounding edema with regional mass effect. Leftward midline shift is present measuring approximately 8 mm. There is no ventricle trapping at this time. No significant central herniation. Vascular: Better evaluated on CTA portion. Skull: Calvarium is unremarkable. Sinuses/Orbits: No acute finding. Other: None. Review of the MIP images confirms the above findings CTA NECK Aortic arch: Great vessel origins are patent. There  is mixed plaque along the proximal left greater than right subclavian arteries without significant stenosis. Right carotid system: Patent. Mild primarily calcified plaque along the proximal internal carotid with minimal stenosis. Left carotid system: Patent. Trace calcified plaque along the proximal internal carotid without stenosis. Vertebral arteries: Patent.  Codominant.  No stenosis. Skeleton: No significant abnormality. Other neck: Enlarged, heterogeneous thyroid. Endotracheal and enteric tubes are present. Upper chest: Patchy ground-glass density in the included upper lobes. Review of the MIP images confirms the above findings CTA HEAD Anterior circulation: Intracranial internal carotid arteries are patent with calcified plaque causing mild stenosis, greater on the right. Anterior and middle cerebral arteries are patent. No aneurysm identified. Posterior circulation: Intracranial vertebral arteries are patent. Basilar artery is patent. Major cerebellar artery origins are patent. Bilateral posterior communicating arteries are present. There is irregularity and moderate stenosis of the proximal to mid right PCOM. Posterior cerebral arteries are patent. No aneurysm identified. Venous sinuses: Patent as allowed by contrast bolus timing. Review of the MIP images confirms the above findings IMPRESSION: Large area of parenchymal hemorrhage centered within the right basal ganglia region and surrounding white matter. Subarachnoid extension is present into adjacent sulci and ventricular system. Small volume contralateral subarachnoid hemorrhage likely reflects recirculation an absence of trauma. Mass effect  including 8 mm leftward midline shift. No ventricle entrapment at this time. No abnormal vascularity in the area of hemorrhage.  No aneurysm. No hemodynamically significant stenosis in the neck. Irregularity and moderate stenosis of the proximal to mid right posterior communicating artery. Patchy ground-glass density  in the included upper lobes. May reflect atelectasis, pneumonia, or edema. Enlarged, heterogeneous thyroid. Ultrasound is recommended as an outpatient if not previously performed. Initial emergent results were called by telephone at the time of interpretation on 05/31/2021 at 4:39 pm to provider Godfrey Pick , who verbally acknowledged these results. Electronically Signed   By: Macy Mis M.D.   On: 05/31/2021 16:55      HISTORY OF PRESENT ILLNESS Patient with a history of mild dementia, CKD IV, HLD, ischemic stroke, HTN and depression was found unresponsive at home on 10/14.  Upon arrival to the ED, she was intubated and found to have [place] on CT scan.  HOSPITAL COURSE Patient was admitted on 10/14 with a large ICH of the right basal ganglia with IVH and SAH and 71mm leftward midline shift, likely caused by a hypertensive emergency.  Patient was intubated on admission and treated for ICH with HTS and blood pressure control.  She developed an AKI on CKD IV and has previously been deemed not to be a candidate for dialysis due to her baseline dementia.  On 10/19, she was extubated and found to have continued respiratory distress.  Her family decided that they did not wish for her to be reintubated or to receive a trach and PEG, and palliative care was consulted.  After goals of care discussion, patient was transitioned to comfort care and will be discharged to an inpatient hospice facility.  RN Pressure Injury Documentation:     DISCHARGE EXAM Blood pressure (!) 196/97, pulse (!) 107, temperature 98.1 F (36.7 C), temperature source Oral, resp. rate 16, height 5\' 3"  (1.6 m), weight 80.1 kg, SpO2 96 %. Patient is noted to be obtunded with flicker of movement to noxious stimulus in RUE. She is otherwise unresponsive to stimuli.  Respirations are even but somewhat labored.  Discharge Diet       Diet   Diet NPO time specified   liquids  DISCHARGE PLAN Disposition:  to inpatient hospice Comfort care  medications ordered  35 minutes were spent preparing discharge. I have personally obtained history,examined this patient, reviewed notes, independently viewed imaging studies, participated in medical decision making and plan of care.ROS completed by me personally and pertinent positives fully documented  I have made any additions or clarifications directly to the above note. Agree with note above.    Antony Contras, MD Medical Director [facility] Pager: [phone] 06/19/2021 10:03 AM

## 2021-06-10 NOTE — Progress Notes (Signed)
Report called to [facility], spoke to [employer]. She is aware of the family dilemma, the sons are in control, sister not to be notified. Butch Penny is aware we waiting on PTAR.

## 2021-06-10 NOTE — Progress Notes (Signed)
[facility] ([facility] Hospital Liaison note.     This patient is approved to transfer to [facility] today.    [facility] will notify TOC when registration paperwork has been completed to arrange transport.    RN please call report to [phone].   Thank you,     Mary Anne Robertson, RN, CCM       [facility] Hospital Liaison  [phone] 

## 2021-06-10 NOTE — Progress Notes (Signed)
Sharyn Dross be D/C'd per MD order.Skin clean, dry and intact without evidence of skin break down, no evidence of skin tears noted.IV catheter to RAC intact in place . Site without signs and symptoms of complications.  An After Visit Summary was printed and given to EMS. All belongings sent with Son. Patient escorted via stretcher, and D/C to [facility].

## 2021-06-18 DEATH — deceased

## 2021-07-01 ENCOUNTER — Encounter (HOSPITAL_COMMUNITY): Payer: Self-pay | Admitting: Radiology
